# Patient Record
Sex: Female | Born: 1950 | Race: White | Hispanic: No | Smoking: Former smoker
Health system: Southern US, Community
[De-identification: ages and names within clinical notes are randomized; demographics above are authoritative.]

## PROBLEM LIST (undated history)

## (undated) DIAGNOSIS — K7460 Unspecified cirrhosis of liver: Secondary | ICD-10-CM

## (undated) DIAGNOSIS — Z9289 Personal history of other medical treatment: Secondary | ICD-10-CM

## (undated) DIAGNOSIS — D649 Anemia, unspecified: Secondary | ICD-10-CM

## (undated) DIAGNOSIS — B182 Chronic viral hepatitis C: Secondary | ICD-10-CM

## (undated) DIAGNOSIS — C801 Malignant (primary) neoplasm, unspecified: Secondary | ICD-10-CM

## (undated) DIAGNOSIS — J439 Emphysema, unspecified: Secondary | ICD-10-CM

## (undated) DIAGNOSIS — M1990 Unspecified osteoarthritis, unspecified site: Secondary | ICD-10-CM

## (undated) DIAGNOSIS — Z8719 Personal history of other diseases of the digestive system: Secondary | ICD-10-CM

## (undated) DIAGNOSIS — B1920 Unspecified viral hepatitis C without hepatic coma: Secondary | ICD-10-CM

## (undated) DIAGNOSIS — K219 Gastro-esophageal reflux disease without esophagitis: Secondary | ICD-10-CM

## (undated) DIAGNOSIS — H3530 Unspecified macular degeneration: Secondary | ICD-10-CM

## (undated) HISTORY — DX: Unspecified cirrhosis of liver: B18.2

## (undated) HISTORY — PX: EYE SURGERY: SHX253

## (undated) HISTORY — PX: CHOLECYSTECTOMY: SHX55

## (undated) HISTORY — DX: Unspecified cirrhosis of liver: K74.60

## (undated) HISTORY — PX: ABDOMINAL HYSTERECTOMY: SHX81

## (undated) HISTORY — DX: Unspecified viral hepatitis C without hepatic coma: B19.20

## (undated) HISTORY — DX: Personal history of other medical treatment: Z92.89

---

## 1997-09-12 HISTORY — PX: BREAST CYST EXCISION: SHX579

## 2005-09-09 ENCOUNTER — Ambulatory Visit: Payer: Self-pay | Admitting: General Practice

## 2013-07-23 ENCOUNTER — Ambulatory Visit: Payer: Self-pay | Admitting: Gastroenterology

## 2013-08-27 ENCOUNTER — Ambulatory Visit: Payer: Self-pay | Admitting: General Practice

## 2013-09-27 ENCOUNTER — Other Ambulatory Visit: Payer: Self-pay | Admitting: Gastroenterology

## 2013-09-27 LAB — CLOSTRIDIUM DIFFICILE(ARMC)

## 2014-03-28 ENCOUNTER — Ambulatory Visit: Payer: Self-pay | Admitting: Gastroenterology

## 2014-04-03 ENCOUNTER — Ambulatory Visit: Payer: Self-pay | Admitting: Internal Medicine

## 2014-04-03 LAB — CBC CANCER CENTER
BASOS ABS: 0 x10 3/mm (ref 0.0–0.1)
BASOS PCT: 0.5 %
Eosinophil #: 0.1 x10 3/mm (ref 0.0–0.7)
Eosinophil %: 2.4 %
HCT: 43.3 % (ref 35.0–47.0)
HGB: 14.6 g/dL (ref 12.0–16.0)
LYMPHS PCT: 13 %
Lymphocyte #: 0.5 x10 3/mm — ABNORMAL LOW (ref 1.0–3.6)
MCH: 34.9 pg — ABNORMAL HIGH (ref 26.0–34.0)
MCHC: 33.8 g/dL (ref 32.0–36.0)
MCV: 103 fL — AB (ref 80–100)
Monocyte #: 0.3 x10 3/mm (ref 0.2–0.9)
Monocyte %: 8.2 %
NEUTROS PCT: 75.9 %
Neutrophil #: 2.9 x10 3/mm (ref 1.4–6.5)
PLATELETS: 39 x10 3/mm — AB (ref 150–440)
RBC: 4.19 10*6/uL (ref 3.80–5.20)
RDW: 14.5 % (ref 11.5–14.5)
WBC: 3.8 x10 3/mm (ref 3.6–11.0)

## 2014-04-12 ENCOUNTER — Ambulatory Visit: Payer: Self-pay | Admitting: Internal Medicine

## 2014-04-17 LAB — PLATELET COUNT: Platelet: 39 10*3/uL — ABNORMAL LOW (ref 150–440)

## 2014-05-13 ENCOUNTER — Ambulatory Visit: Payer: Self-pay | Admitting: Internal Medicine

## 2014-09-17 ENCOUNTER — Ambulatory Visit: Payer: Self-pay | Admitting: Gastroenterology

## 2014-09-26 ENCOUNTER — Ambulatory Visit: Payer: Self-pay | Admitting: Gastroenterology

## 2015-03-31 ENCOUNTER — Other Ambulatory Visit: Payer: Self-pay | Admitting: Gastroenterology

## 2015-03-31 DIAGNOSIS — K7460 Unspecified cirrhosis of liver: Secondary | ICD-10-CM

## 2015-04-02 ENCOUNTER — Ambulatory Visit
Admission: RE | Admit: 2015-04-02 | Discharge: 2015-04-02 | Disposition: A | Discharge status: Home / Self Care | Payer: Medicare Other | Source: Ambulatory Visit | Attending: Gastroenterology | Admitting: Gastroenterology

## 2015-04-02 DIAGNOSIS — K7031 Alcoholic cirrhosis of liver with ascites: Secondary | ICD-10-CM | POA: Insufficient documentation

## 2015-04-02 DIAGNOSIS — I868 Varicose veins of other specified sites: Secondary | ICD-10-CM | POA: Diagnosis not present

## 2015-04-02 DIAGNOSIS — K7460 Unspecified cirrhosis of liver: Secondary | ICD-10-CM

## 2015-04-02 DIAGNOSIS — R161 Splenomegaly, not elsewhere classified: Secondary | ICD-10-CM | POA: Insufficient documentation

## 2015-06-18 ENCOUNTER — Emergency Department: Payer: Medicare Other

## 2015-06-18 ENCOUNTER — Emergency Department
Admission: EM | Admit: 2015-06-18 | Discharge: 2015-06-18 | Disposition: A | Discharge status: Home / Self Care | Payer: Medicare Other | Attending: Emergency Medicine | Admitting: Emergency Medicine

## 2015-06-18 DIAGNOSIS — S20212A Contusion of left front wall of thorax, initial encounter: Secondary | ICD-10-CM | POA: Diagnosis not present

## 2015-06-18 DIAGNOSIS — S29001A Unspecified injury of muscle and tendon of front wall of thorax, initial encounter: Secondary | ICD-10-CM | POA: Diagnosis present

## 2015-06-18 DIAGNOSIS — Y9389 Activity, other specified: Secondary | ICD-10-CM | POA: Insufficient documentation

## 2015-06-18 DIAGNOSIS — Y990 Civilian activity done for income or pay: Secondary | ICD-10-CM | POA: Insufficient documentation

## 2015-06-18 DIAGNOSIS — Y9289 Other specified places as the place of occurrence of the external cause: Secondary | ICD-10-CM | POA: Diagnosis not present

## 2015-06-18 DIAGNOSIS — X58XXXA Exposure to other specified factors, initial encounter: Secondary | ICD-10-CM | POA: Diagnosis not present

## 2015-06-18 DIAGNOSIS — Z880 Allergy status to penicillin: Secondary | ICD-10-CM | POA: Diagnosis not present

## 2015-06-18 MED ORDER — PROMETHAZINE HCL 25 MG PO TABS: 25.0000 mg | ORAL_TABLET | Freq: Four times a day (QID) | ORAL | Status: DC | PRN

## 2015-06-18 MED ORDER — BACLOFEN 10 MG PO TABS: 10.0000 mg | ORAL_TABLET | Freq: Three times a day (TID) | ORAL | Status: DC

## 2015-06-18 MED ORDER — OXYCODONE HCL 5 MG PO TABS: 5.0000 mg | ORAL_TABLET | ORAL | Status: DC | PRN

## 2015-06-18 MED ORDER — IBUPROFEN 800 MG PO TABS: 800.0000 mg | ORAL_TABLET | Freq: Three times a day (TID) | ORAL | Status: DC | PRN

## 2015-06-18 NOTE — ED Provider Notes (Signed)
[facility] Emergency Department Provider Note  ____________________________________________  Time seen: Approximately 8:48 AM  I have reviewed the triage vital signs and the nursing notes.   HISTORY  Chief Complaint Muscle Pain    HPI Kieana Livesay is a 64 y.o. female who reports that she was working out with her trainer when she felt something pop in her left lateral chest approximately one week ago. Complains of continuous pain in the left rib and muscle area.   No past medical history on file.  There are no active problems to display for this patient.   No past surgical history on file.  Current Outpatient Rx  Name  Route  Sig  Dispense  Refill  . baclofen (LIORESAL) 10 MG tablet   Oral   Take 1 tablet (10 mg total) by mouth 3 (three) times daily.   30 each   0   . ibuprofen (ADVIL,MOTRIN) 800 MG tablet   Oral   Take 1 tablet (800 mg total) by mouth every 8 (eight) hours as needed.   30 tablet   0   . oxyCODONE (OXY IR/ROXICODONE) 5 MG immediate release tablet   Oral   Take 1 tablet (5 mg total) by mouth every 4 (four) hours as needed for severe pain.   8 tablet   0     Allergies Penicillins  No family history on file.  Social History Social History  Substance Use Topics  . Smoking status: Not on file  . Smokeless tobacco: Not on file  . Alcohol Use: Not on file    Review of Systems Constitutional: No fever/chills Eyes: No visual changes. ENT: No sore throat. Cardiovascular: Denies chest pain. Respiratory: Denies shortness of breath. Gastrointestinal: No abdominal pain.  No nausea, no vomiting.  No diarrhea.  No constipation. Genitourinary: Negative for dysuria. Musculoskeletal: Negative for back pain. Positive for left rib and muscle pain. Skin: Negative for rash. Neurological: Negative for headaches, focal weakness or numbness.  10-point ROS otherwise  negative.  ____________________________________________   PHYSICAL EXAM:  VITAL SIGNS: ED Triage Vitals  Enc Vitals Group     BP 06/18/15 0838 128/67 mmHg     Pulse Rate 06/18/15 0838 72     Resp 06/18/15 0838 18     Temp 06/18/15 0838 97.7 F (36.5 C)     Temp Source 06/18/15 0838 Oral     SpO2 06/18/15 0838 98 %     Weight 06/18/15 0838 170 lb (77.111 kg)     Height 06/18/15 0838 5\' 2"  (1.575 m)     Head Cir --      Peak Flow --      Pain Score 06/18/15 0839 8     Pain Loc --      Pain Edu? --      Excl. in [place]? --     Constitutional: Alert and oriented. Well appearing and in no acute distress. Neck: No stridor.   Cardiovascular: Normal rate, regular rhythm. Grossly normal heart sounds.  Good peripheral circulation. Respiratory: Normal respiratory effort.  No retractions. Lungs CTAB. Gastrointestinal: Soft and nontender. No distention. No abdominal bruits. No CVA tenderness. Musculoskeletal: No lower extremity tenderness nor edema.  No joint effusions. Definite left lower rib tenderness and pain. No ecchymosis or bruising noted. Neurologic:  Normal speech and language. No gross focal neurologic deficits are appreciated. No gait instability. Skin:  Skin is warm, dry and intact. No rash noted. Psychiatric: Mood and affect are normal. Speech and  behavior are normal.  ____________________________________________   LABS (all labs ordered are listed, but only abnormal results are displayed)  Labs Reviewed - No data to display ____________________________________________    RADIOLOGY  No acute pulmonary findings no acute rib fractures noted. Interpreted by radiology and reviewed by myself. ____________________________________________   PROCEDURES  Procedure(s) performed: None  Critical Care performed: No  ____________________________________________   INITIAL IMPRESSION / ASSESSMENT AND PLAN / ED COURSE  Pertinent labs & imaging results that were available  during my care of the patient were reviewed by me and considered in my medical decision making (see chart for details).  Acute left chest muscle strain and rib contusion. Rx given for Motrin 800 mg 3 times a day, Flexeril 5 mg 3 times a day and oxycodone 5 one to 2 when necessary severe pain #8 no refills. She'll follow up with PCP or return to the ER if any worsening symptomology.  She voices no other emergency medical complaints at this visit. ____________________________________________   FINAL CLINICAL IMPRESSION(S) / ED DIAGNOSES  Final diagnoses:  Rib contusion, left, initial encounter      Arlyss Repress, PA-C 06/18/15 [place], MD 06/18/15 1521

## 2015-06-18 NOTE — Discharge Instructions (Signed)
Rib Contusion A rib contusion is a deep bruise on your rib area. Contusions are the result of a blunt trauma that causes bleeding and injury to the tissues under the skin. A rib contusion may involve bruising of the ribs and of the skin and muscles in the area. The skin overlying the contusion may turn blue, purple, or yellow. Minor injuries will give you a painless contusion, but more severe contusions may stay painful and swollen for a few weeks. CAUSES  A contusion is usually caused by a blow, trauma, or direct force to an area of the body. This often occurs while playing contact sports. SYMPTOMS  Swelling and redness of the injured area.  Discoloration of the injured area.  Tenderness and soreness of the injured area.  Pain with or without movement. DIAGNOSIS  The diagnosis can be made by taking a medical history and performing a physical exam. An X-ray, CT scan, or MRI may be needed to determine if there were any associated injuries, such as broken bones (fractures) or internal injuries. TREATMENT  Often, the best treatment for a rib contusion is rest. Icing or applying cold compresses to the injured area may help reduce swelling and inflammation. Deep breathing exercises may be recommended to reduce the risk of partial lung collapse and pneumonia. Over-the-counter or prescription medicines may also be recommended for pain control. HOME CARE INSTRUCTIONS   Apply ice to the injured area:  Put ice in a plastic bag.  Place a towel between your skin and the bag.  Leave the ice on for 20 minutes, 2-3 times per day.  Take medicines only as directed by your health care provider.  Rest the injured area. Avoid strenuous activity and any activities or movements that cause pain. Be careful during activities and avoid bumping the injured area.  Perform deep-breathing exercises as directed by your health care provider.  Do not lift anything that is heavier than 5 lb (2.3 kg) until your  health care provider approves.  Do not use any tobacco products, including cigarettes, chewing tobacco, or electronic cigarettes. If you need help quitting, ask your health care provider. SEEK MEDICAL CARE IF:   You have increased bruising or swelling.  You have pain that is not controlled with treatment.  You have a fever. SEEK IMMEDIATE MEDICAL CARE IF:   You have difficulty breathing or shortness of breath.  You develop a continual cough, or you cough up thick or bloody sputum.  You feel sick to your stomach (nauseous), you throw up (vomit), or you have abdominal pain.   This information is not intended to replace advice given to you by your health care provider. Make sure you discuss any questions you have with your health care provider.   Document Released: 05/24/2001 Document Revised: 09/19/2014 Document Reviewed: 06/10/2014 Elsevier Interactive Patient Education 2016 [employer]  

## 2015-06-18 NOTE — ED Notes (Signed)
Pt reports she was working with a trainer 1 week ago and felt a "pop" to the left side of her rib cage.

## 2015-08-04 ENCOUNTER — Inpatient Hospital Stay: Payer: Medicare Other | Attending: Internal Medicine | Admitting: Internal Medicine

## 2015-08-04 ENCOUNTER — Encounter: Payer: Self-pay | Admitting: Internal Medicine

## 2015-08-04 VITALS — BP 109/74 | HR 65 | Temp 97.5°F | Resp 16 | Wt 174.2 lb

## 2015-08-04 DIAGNOSIS — Z801 Family history of malignant neoplasm of trachea, bronchus and lung: Secondary | ICD-10-CM | POA: Insufficient documentation

## 2015-08-04 DIAGNOSIS — Z8619 Personal history of other infectious and parasitic diseases: Secondary | ICD-10-CM | POA: Insufficient documentation

## 2015-08-04 DIAGNOSIS — K766 Portal hypertension: Secondary | ICD-10-CM | POA: Diagnosis not present

## 2015-08-04 DIAGNOSIS — K7460 Unspecified cirrhosis of liver: Secondary | ICD-10-CM | POA: Diagnosis not present

## 2015-08-04 DIAGNOSIS — R161 Splenomegaly, not elsewhere classified: Secondary | ICD-10-CM | POA: Insufficient documentation

## 2015-08-04 DIAGNOSIS — Z803 Family history of malignant neoplasm of breast: Secondary | ICD-10-CM | POA: Insufficient documentation

## 2015-08-04 DIAGNOSIS — D696 Thrombocytopenia, unspecified: Secondary | ICD-10-CM | POA: Diagnosis not present

## 2015-08-04 DIAGNOSIS — Z79899 Other long term (current) drug therapy: Secondary | ICD-10-CM | POA: Diagnosis not present

## 2015-08-04 DIAGNOSIS — D731 Hypersplenism: Secondary | ICD-10-CM | POA: Diagnosis not present

## 2015-08-04 DIAGNOSIS — D72819 Decreased white blood cell count, unspecified: Secondary | ICD-10-CM | POA: Diagnosis not present

## 2015-08-04 DIAGNOSIS — D6959 Other secondary thrombocytopenia: Secondary | ICD-10-CM

## 2015-08-04 NOTE — Progress Notes (Signed)
[place] @ [facility] [phone]  [phone]     Mylin Compo OB: 1951/01/01  MR#: [id number]  [id number]  Patient Care Team: Glendon Axe, MD as PCP - General (Internal Medicine)  CHIEF COMPLAINT:  Chief Complaint  Patient presents with  . New Evaluation    thrombocytopenia     No history exists.  Mrs. Beus was referred to our clinic originally in 2015 for evaluation of thrombocytopenia. Extensive workup revealed presence of hepatitis C, liver cirrhosis with portal hypertension, splenomegaly, hypersplenism, which was felt to be the reason for hematologic abnormalities. She was treated with Harvoni , so hepatitis C is essentially cured.  INTERVAL HISTORY:  Since her last visit in 2015 Ms. Covington has done reasonably well. She has a decent energy level, she denies abdominal distention increased abdominal girth, lower extremity edema, excessive bleeding. She endorses easy bruising though. She had FOBT positive recently, and is scheduled for upper and lower endoscopy to monitor the status of varicose veins. She also denies shortness of breath, confusion, chest pain, nausea, vomiting, diarrhea constipation.   REVIEW OF SYSTEMS:   As per HPI. Otherwise, a complete review of systems is negatve.  PAST MEDICAL HISTORY: Past Medical History  Diagnosis Date  . Hepatitis C   . Hx of transfusion of whole blood   . Cirrhosis ([place])   . Chronic hepatitis C with cirrhosis ([place])     PAST SURGICAL HISTORY: Past Surgical History  Procedure Laterality Date  . Cholecystectomy    . Abdominal hysterectomy      FAMILY HISTORY Family History  Problem Relation Age of Onset  . Lung cancer Mother   . Breast cancer Mother   . Breast cancer Sister     ADVANCED DIRECTIVES:  No flowsheet data found.  HEALTH MAINTENANCE: Social History  Substance Use Topics  . Smoking status: Never Smoker   . Smokeless tobacco: None  . Alcohol Use: Yes     Comment:  occasional wine and beer  drinks beer and wine occasionally.    Allergies  Allergen Reactions  . Penicillins Rash    Current Outpatient Prescriptions  Medication Sig Dispense Refill  . Ascorbic Acid (VITAMIN C) 1000 MG tablet Take by mouth.    . Cinnamon 500 MG capsule Take by mouth.    . Folic Acid 20 MG CAPS Take by mouth.    . furosemide (LASIX) 40 MG tablet Take by mouth.    . nadolol (CORGARD) 20 MG tablet Take by mouth.    . pantoprazole (PROTONIX) 40 MG tablet Take by mouth.    . spironolactone (ALDACTONE) 100 MG tablet Take by mouth.     No current facility-administered medications for this visit.    OBJECTIVE:  Filed Vitals:   08/04/15 1523  BP: 109/74  Pulse: 65  Temp: 97.5 F (36.4 C)  Resp: 16     Body mass index is 31.85 kg/(m^2).    ECOG FS:0 - Asymptomatic  PHYSICAL EXAM: BP 109/74 mmHg  Pulse 65  Temp(Src) 97.5 F (36.4 C) (Tympanic)  Resp 16  Wt 174 lb 3.3 oz (79.02 kg)  General Appearance:    Alert, cooperative, no distress, appears stated age 64 female   Head:    Normocephalic, without obvious abnormality, atraumatic  Eyes:    PERRL, conjunctiva/corneas clear, EOM's intact, fundi    benign, both eyes  Ears:    Normal TM's and external ear canals, both ears  Nose:   Nares normal, septum midline, mucosa normal, no  drainage    or sinus tenderness  Throat:   Lips, mucosa, and tongue normal; teeth and gums normal  Neck:   Supple, symmetrical, trachea midline, no adenopathy;    thyroid:  no enlargement/tenderness/nodules; no carotid   bruit or JVD  Back:     Symmetric, no curvature, ROM normal, no CVA tenderness  Lungs:     Clear to auscultation bilaterally, respirations unlabored  Chest Wall:    No tenderness or deformity   Heart:    Regular rate and rhythm, S1 and S2 normal, no murmur, rub   or gallop  Breast Exam:    No tenderness, masses, or nipple abnormality  Abdomen:     Soft, non-tender, bowel sounds active all four quadrants,     no masses, no organomegaly is appreciated, but physical exam is difficult secondary to body habitus  Genitalia:    Normal female without lesion, discharge or tenderness  Rectal:    Normal tone, normal prostate, no masses or tenderness;   guaiac negative stool  Extremities:   Extremities normal, atraumatic, no cyanosis or edema  Pulses:   2+ and symmetric all extremities  Skin:   Skin color, texture, turgor normal, no rashes or lesions  Lymph nodes:   Cervical, supraclavicular, and axillary nodes normal  Neurologic:   CNII-XII intact, normal strength, sensation and reflexes    throughout      LAB RESULTS: CBC from 07/21/2015 White blood cell count 3.3, hemoglobin 13.1, platelet count 40,000, absolute neutrophil count 2300, absolute lymphocyte count 500, absolute monocyte count 400   PT performed on 05/26/2015-15.7 seconds, INR 1.3 ULTRASOUND ABDOMEN COMPLETE  COMPARISON: Abdominal ultrasound of September 17, 2014  FINDINGS: Gallbladder: The gallbladder is surgically absent.  Common bile duct: Diameter: 3.5 mm.  Liver: The hepatic echotexture is heterogeneous. The surface contour is irregular. There is no intrahepatic ductal dilation. There varices noted at the level of the porta hepatis.  IVC: No abnormality visualized.  Pancreas: Bowel gas obscured the pancreatic head and tail.  Spleen: The spleen is enlarged and exhibits a volume of 647 cc.  Right Kidney: Length: 10.7 cm. Echogenicity within normal limits. No mass or hydronephrosis visualized.  Left Kidney: Length: 10.9 cm. Echogenicity within normal limits. No mass or hydronephrosis visualized.  Abdominal aorta: No aneurysm visualized.  Other findings: No ascites is observed.  IMPRESSION: 1. 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 There is no hepatic mass. The gallbladder is surgically absent. There is limited evaluation of the pancreatic head and tail due to bowel gas. 2. The  kidneys and abdominal aorta are unremarkable.    STUDIES: No results found.  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 Clinically she continues to do fairly well. Hematologic parameters appear stable and within the range expected for hypersplenism. Unfortunately, no intervention short of liver transplant is going to improve her hematologic parameters. If patient requires invasive procedures such as biopsies during upper and lower endoscopies, then platelet transfusions should be given as needed to prevent bleeding. Same applies to potential need for clotting factors, which could be decreased secondary to liver cirrhosis-if needed, FFP should be administered around the time of procedure.  No need for further evaluation or follow-up with hematology.  Patient expressed understanding and was in agreement with this plan. She also understands that She can call clinic at any time with any questions, concerns,  or complaints.    No matching staging information was found for the patient.  Roxana Hires, MD   08/04/2015 4:45 PM

## 2015-08-04 NOTE — Progress Notes (Signed)
Patient has thrombocytopenia that is followed by PCP and is scheduled for colonoscopy with EGD scheduled for 09/17/2014.  GI would like her to be re-evaluated before procedures.

## 2015-09-17 ENCOUNTER — Encounter: Payer: Self-pay | Admitting: *Deleted

## 2015-09-18 ENCOUNTER — Ambulatory Visit: Payer: Medicare Other | Admitting: Anesthesiology

## 2015-09-18 ENCOUNTER — Encounter: Payer: Self-pay | Admitting: Anesthesiology

## 2015-09-18 ENCOUNTER — Encounter
Admission: RE | Disposition: A | Discharge status: Home / Self Care | Payer: Self-pay | Source: Ambulatory Visit | Attending: Gastroenterology

## 2015-09-18 ENCOUNTER — Ambulatory Visit
Admission: RE | Admit: 2015-09-18 | Discharge: 2015-09-18 | Disposition: A | Discharge status: Home / Self Care | Payer: Medicare Other | Source: Ambulatory Visit | Attending: Gastroenterology | Admitting: Gastroenterology

## 2015-09-18 DIAGNOSIS — Z539 Procedure and treatment not carried out, unspecified reason: Secondary | ICD-10-CM | POA: Insufficient documentation

## 2015-09-18 DIAGNOSIS — I8510 Secondary esophageal varices without bleeding: Secondary | ICD-10-CM | POA: Diagnosis not present

## 2015-09-18 DIAGNOSIS — Z87891 Personal history of nicotine dependence: Secondary | ICD-10-CM | POA: Diagnosis not present

## 2015-09-18 DIAGNOSIS — K7460 Unspecified cirrhosis of liver: Secondary | ICD-10-CM | POA: Diagnosis not present

## 2015-09-18 DIAGNOSIS — B182 Chronic viral hepatitis C: Secondary | ICD-10-CM | POA: Diagnosis not present

## 2015-09-18 LAB — CBC
HCT: 42.3 % (ref 35.0–47.0)
HEMOGLOBIN: 14.2 g/dL (ref 12.0–16.0)
MCH: 32.7 pg (ref 26.0–34.0)
MCHC: 33.6 g/dL (ref 32.0–36.0)
MCV: 97.3 fL (ref 80.0–100.0)
Platelets: 40 10*3/uL — ABNORMAL LOW (ref 150–440)
RBC: 4.35 MIL/uL (ref 3.80–5.20)
RDW: 13.9 % (ref 11.5–14.5)
WBC: 2.9 10*3/uL — ABNORMAL LOW (ref 3.6–11.0)

## 2015-09-18 LAB — BASIC METABOLIC PANEL
ANION GAP: 6 (ref 5–15)
BUN: 11 mg/dL (ref 6–20)
CALCIUM: 9.2 mg/dL (ref 8.9–10.3)
CO2: 27 mmol/L (ref 22–32)
Chloride: 104 mmol/L (ref 101–111)
Creatinine, Ser: 0.6 mg/dL (ref 0.44–1.00)
GFR calc Af Amer: >60 mL/min (ref >60)
GLUCOSE: 100 mg/dL — AB (ref 65–99)
Potassium: 4.1 mmol/L (ref 3.5–5.1)
Sodium: 137 mmol/L (ref 135–145)

## 2015-09-18 LAB — HEPATIC FUNCTION PANEL
ALBUMIN: 3.4 g/dL — AB (ref 3.5–5.0)
ALK PHOS: 76 U/L (ref 38–126)
ALT: 18 U/L (ref 14–54)
AST: 37 U/L (ref 15–41)
BILIRUBIN INDIRECT: 2.4 mg/dL — AB (ref 0.3–0.9)
BILIRUBIN TOTAL: 3 mg/dL — AB (ref 0.3–1.2)
Bilirubin, Direct: 0.6 mg/dL — ABNORMAL HIGH (ref 0.1–0.5)
TOTAL PROTEIN: 7.7 g/dL (ref 6.5–8.1)

## 2015-09-18 LAB — PROTIME-INR
INR: 1.47
PROTHROMBIN TIME: 17.9 s — AB (ref 11.4–15.0)

## 2015-09-18 LAB — AMMONIA: AMMONIA: 50 umol/L — AB (ref 9–35)

## 2015-09-18 SURGERY — COLONOSCOPY WITH PROPOFOL
Anesthesia: General

## 2015-09-18 MED ORDER — SODIUM CHLORIDE 0.9 % IV SOLN: INTRAVENOUS | Status: DC

## 2015-09-18 NOTE — Anesthesia Preprocedure Evaluation (Addendum)
Anesthesia Evaluation  Patient identified by MRN, date of birth, ID band Patient awake    Reviewed: Allergy & Precautions, H&P , NPO status , Patient's Chart, lab work & pertinent test results, reviewed documented beta blocker date and time   History of Anesthesia Complications Negative for: history of anesthetic complications  Airway Mallampati: II  TM Distance: >3 FB Neck ROM: full    Dental no notable dental hx. (+) Caps   Pulmonary neg pulmonary ROS, former smoker,    Pulmonary exam normal breath sounds clear to auscultation       Cardiovascular Exercise Tolerance: Good negative cardio ROS Normal cardiovascular exam Rhythm:regular Rate:Normal     Neuro/Psych negative neurological ROS  negative psych ROS   GI/Hepatic neg GERD  ,(+) Cirrhosis   Esophageal Varices    , Hepatitis - (s/p treatment), CEsophageal varices   Endo/Other  negative endocrine ROS  Renal/GU negative Renal ROS  negative genitourinary   Musculoskeletal   Abdominal   Peds  Hematology negative hematology ROS (+)   Anesthesia Other Findings Past Medical History:   Hepatitis C                                                  Hx of transfusion of whole blood                             Cirrhosis (HCC)                                              Chronic hepatitis C with cirrhosis (HCC)                     Reproductive/Obstetrics negative OB ROS                            Anesthesia Physical Anesthesia Plan  ASA: IV  Anesthesia Plan: General   Post-op Pain Management:    Induction:   Airway Management Planned:   Additional Equipment:   Intra-op Plan:   Post-operative Plan:   Informed Consent: I have reviewed the patients History and Physical, chart, labs and discussed the procedure including the risks, benefits and alternatives for the proposed anesthesia with the patient or authorized representative  who has indicated his/her understanding and acceptance.   Dental Advisory Given  Plan Discussed with: Anesthesiologist, CRNA and Surgeon  Anesthesia Plan Comments:         Anesthesia Quick Evaluation

## 2015-09-18 NOTE — Evaluation (Signed)
Patient presenting today for EGD and colonoscopy in regards to a positive cologard test and personal history of colon polyps in the setting of cirrhosis. Her to the procedure CBC with platelet count and pro time were done for these being abnormal the platelet count being only 40. Such we'll need to delay active procedure until he can arrange in with platelets given for the procedure. Also of note has a elevated pro time in regards to her history of liver disease.  This is been explained fully to the patient and we will help her rearrange.

## 2015-11-27 ENCOUNTER — Other Ambulatory Visit
Admission: RE | Admit: 2015-11-27 | Discharge: 2015-11-27 | Disposition: A | Discharge status: Home / Self Care | Payer: Medicare Other | Source: Ambulatory Visit | Attending: Gastroenterology | Admitting: Gastroenterology

## 2015-11-27 DIAGNOSIS — K7460 Unspecified cirrhosis of liver: Secondary | ICD-10-CM | POA: Insufficient documentation

## 2015-11-27 DIAGNOSIS — D696 Thrombocytopenia, unspecified: Secondary | ICD-10-CM | POA: Insufficient documentation

## 2015-11-27 DIAGNOSIS — D649 Anemia, unspecified: Secondary | ICD-10-CM | POA: Diagnosis not present

## 2015-11-27 LAB — CBC
HCT: 37.8 % (ref 35.0–47.0)
Hemoglobin: 12.8 g/dL (ref 12.0–16.0)
MCH: 33.6 pg (ref 26.0–34.0)
MCHC: 34 g/dL (ref 32.0–36.0)
MCV: 99.1 fL (ref 80.0–100.0)
Platelets: 33 10*3/uL — ABNORMAL LOW (ref 150–440)
RBC: 3.81 MIL/uL (ref 3.80–5.20)
RDW: 14.7 % — ABNORMAL HIGH (ref 11.5–14.5)
WBC: 1.8 10*3/uL — ABNORMAL LOW (ref 3.6–11.0)

## 2015-11-27 LAB — PROTIME-INR
INR: 1.55
Prothrombin Time: 18.6 seconds — ABNORMAL HIGH (ref 11.4–15.0)

## 2015-11-27 LAB — TYPE AND SCREEN
ABO/RH(D): A POS
Antibody Screen: NEGATIVE

## 2015-11-27 LAB — ABO/RH: ABO/RH(D): A POS

## 2015-11-30 ENCOUNTER — Encounter
Admission: RE | Disposition: A | Discharge status: Home / Self Care | Payer: Self-pay | Source: Ambulatory Visit | Attending: Gastroenterology

## 2015-11-30 ENCOUNTER — Ambulatory Visit: Payer: Medicare Other | Admitting: Anesthesiology

## 2015-11-30 ENCOUNTER — Ambulatory Visit
Admission: RE | Admit: 2015-11-30 | Discharge: 2015-11-30 | Disposition: A | Discharge status: Home / Self Care | Payer: Medicare Other | Source: Ambulatory Visit | Attending: Gastroenterology | Admitting: Gastroenterology

## 2015-11-30 ENCOUNTER — Encounter: Payer: Self-pay | Admitting: *Deleted

## 2015-11-30 DIAGNOSIS — D689 Coagulation defect, unspecified: Secondary | ICD-10-CM | POA: Diagnosis not present

## 2015-11-30 DIAGNOSIS — D696 Thrombocytopenia, unspecified: Secondary | ICD-10-CM | POA: Diagnosis not present

## 2015-11-30 DIAGNOSIS — B182 Chronic viral hepatitis C: Secondary | ICD-10-CM | POA: Diagnosis not present

## 2015-11-30 DIAGNOSIS — K7460 Unspecified cirrhosis of liver: Secondary | ICD-10-CM | POA: Diagnosis not present

## 2015-11-30 DIAGNOSIS — Z79899 Other long term (current) drug therapy: Secondary | ICD-10-CM | POA: Diagnosis not present

## 2015-11-30 DIAGNOSIS — Z880 Allergy status to penicillin: Secondary | ICD-10-CM | POA: Diagnosis not present

## 2015-11-30 LAB — PLATELET COUNT: Platelets: 42 10*3/uL — ABNORMAL LOW (ref 150–440)

## 2015-11-30 LAB — PROTIME-INR
INR: 1.66
Prothrombin Time: 19.6 seconds — ABNORMAL HIGH (ref 11.4–15.0)

## 2015-11-30 LAB — AMMONIA: Ammonia: 27 umol/L (ref 9–35)

## 2015-11-30 SURGERY — COLONOSCOPY WITH PROPOFOL
Anesthesia: General

## 2015-11-30 MED ORDER — SODIUM CHLORIDE 0.9 % IV SOLN: INTRAVENOUS | Status: DC

## 2015-11-30 MED ORDER — SODIUM CHLORIDE 0.9 % IV SOLN
INTRAVENOUS | Status: DC
  Administered 2015-11-30: 10:00:00 via INTRAVENOUS

## 2015-11-30 MED ORDER — SODIUM CHLORIDE 0.9 % IV SOLN
INTRAVENOUS | Status: DC
  Administered 2015-11-30: 09:00:00 via INTRAVENOUS

## 2015-11-30 NOTE — Anesthesia Preprocedure Evaluation (Deleted)
Anesthesia Evaluation  Patient identified by MRN, date of birth, ID band Patient awake    Reviewed: Allergy & Precautions, NPO status , Patient's Chart, lab work & pertinent test results, reviewed documented beta blocker date and time   Airway Mallampati: II  TM Distance: >3 FB     Dental  (+) Chipped   Pulmonary           Cardiovascular      Neuro/Psych    GI/Hepatic (+) Hepatitis -, C  Endo/Other    Renal/GU      Musculoskeletal   Abdominal   Peds  Hematology   Anesthesia Other Findings   Reproductive/Obstetrics                             Anesthesia Physical Anesthesia Plan  ASA: III  Anesthesia Plan: General   Post-op Pain Management:    Induction: Intravenous  Airway Management Planned: Nasal Cannula  Additional Equipment:   Intra-op Plan:   Post-operative Plan:   Informed Consent: I have reviewed the patients History and Physical, chart, labs and discussed the procedure including the risks, benefits and alternatives for the proposed anesthesia with the patient or authorized representative who has indicated his/her understanding and acceptance.     Plan Discussed with: CRNA  Anesthesia Plan Comments:         Anesthesia Quick Evaluation

## 2015-11-30 NOTE — H&P (Signed)
Outpatient short stay form Pre-procedure 11/30/2015 10:14 AM Michelle Sails MD  Primary Physician: Dr. Glendon Axe  Reason for visit:  EGD and colonoscopy  History of present illness:  Patient is a 65 year old female presenting today for EGD and colonoscopy. She has a history of cirrhosis with marked thrombocytopenia as well as coagulopathy. She has been successfully treated for hepatitis C. He is presenting today for EGD evaluation in regards possible varices/cirrhosis as well as a personal history of colon polyps. Her last colonoscopy was in 2011 with a finding of one tubular adenoma.  She tolerated her prep well. She takes no aspirin or blood thinning products. Due to the coagulopathy she is receiving a unit of pheresis platelets. Platelets and pro time will need to be checked after the infusion is done.    Current facility-administered medications:  .  0.9 %  sodium chloride infusion, , Intravenous, Continuous, Michelle Sails, MD, Last Rate: 20 mL/hr at 11/30/15 0906 .  0.9 %  sodium chloride infusion, , Intravenous, Continuous, Michelle Sails, MD, Last Rate: 20 mL/hr at 11/30/15 0941 .  0.9 %  sodium chloride infusion, , Intravenous, Continuous, Michelle Sails, MD  Prescriptions prior to admission  Medication Sig Dispense Refill Last Dose  . Ascorbic Acid (VITAMIN C) 1000 MG tablet Take by mouth.   Past Week at Unknown time  . Cinnamon 500 MG capsule Take by mouth.   Past Week at Unknown time  . Folic Acid 20 MG CAPS Take by mouth.   Past Week at Unknown time  . furosemide (LASIX) 40 MG tablet Take by mouth.   11/29/2015 at Unknown time  . nadolol (CORGARD) 20 MG tablet Take by mouth.   11/29/2015 at Unknown time  . pantoprazole (PROTONIX) 40 MG tablet Take by mouth.   11/29/2015 at Unknown time  . spironolactone (ALDACTONE) 100 MG tablet Take by mouth.   11/29/2015 at Unknown time  . baclofen (LIORESAL) 10 MG tablet Take 10 mg by mouth 3 (three) times daily.     .  promethazine (PHENERGAN) 25 MG tablet Take 25 mg by mouth every 6 (six) hours as needed for nausea or vomiting.     . traMADol-acetaminophen (ULTRACET) 37.5-325 MG tablet Take 1 tablet by mouth every 8 (eight) hours as needed.        Allergies  Allergen Reactions  . Penicillins Rash     Past Medical History  Diagnosis Date  . Hepatitis C   . Hx of transfusion of whole blood   . Cirrhosis ([place])   . Chronic hepatitis C with cirrhosis ([place])     Review of systems:      Physical Exam    Heart and lungs: Regular rate and rhythm without rub or gallop, lungs are bilaterally clear.    HEENT: Normocephalic atraumatic eyes are anicteric. It is of note she has a some conjunctival hemorrhage on the right that is resolving    Other:     Pertinant exam for procedure: Soft nontender nondistended bowel sounds positive normoactive.    Planned proceedures: EGD, colonoscopy and indicated procedures. I have discussed the risks benefits and complications of procedures to include not limited to bleeding, infection, perforation and the risk of sedation and the patient wishes to proceed.    Michelle Sails, MD Gastroenterology 11/30/2015  10:14 AM

## 2015-12-01 LAB — PREPARE PLATELET PHERESIS: Unit division: 0

## 2015-12-15 ENCOUNTER — Other Ambulatory Visit: Payer: Self-pay | Admitting: Gastroenterology

## 2015-12-15 DIAGNOSIS — Z86010 Personal history of colonic polyps: Secondary | ICD-10-CM

## 2015-12-15 DIAGNOSIS — K7460 Unspecified cirrhosis of liver: Secondary | ICD-10-CM

## 2015-12-16 ENCOUNTER — Other Ambulatory Visit: Payer: Self-pay | Admitting: Gastroenterology

## 2015-12-16 DIAGNOSIS — K7460 Unspecified cirrhosis of liver: Secondary | ICD-10-CM

## 2015-12-16 DIAGNOSIS — R1031 Right lower quadrant pain: Secondary | ICD-10-CM

## 2015-12-22 ENCOUNTER — Ambulatory Visit: Payer: Medicare Other

## 2015-12-29 ENCOUNTER — Ambulatory Visit: Payer: Medicare Other

## 2016-01-07 ENCOUNTER — Ambulatory Visit: Payer: Medicare Other

## 2016-01-08 ENCOUNTER — Ambulatory Visit
Admission: RE | Admit: 2016-01-08 | Discharge: 2016-01-08 | Disposition: A | Discharge status: Home / Self Care | Payer: Medicare Other | Source: Ambulatory Visit | Attending: Gastroenterology | Admitting: Gastroenterology

## 2016-01-08 DIAGNOSIS — K7460 Unspecified cirrhosis of liver: Secondary | ICD-10-CM | POA: Insufficient documentation

## 2016-01-08 DIAGNOSIS — K5730 Diverticulosis of large intestine without perforation or abscess without bleeding: Secondary | ICD-10-CM | POA: Insufficient documentation

## 2016-01-08 DIAGNOSIS — R1031 Right lower quadrant pain: Secondary | ICD-10-CM

## 2016-01-08 MED ORDER — GADOXETATE DISODIUM 0.25 MMOL/ML IV SOLN
8.0000 mL | Freq: Once | INTRAVENOUS | Status: AC | PRN
  Administered 2016-01-08: 10 mL via INTRAVENOUS

## 2016-11-08 ENCOUNTER — Other Ambulatory Visit: Payer: Self-pay | Admitting: Internal Medicine

## 2016-11-08 ENCOUNTER — Telehealth: Payer: Self-pay | Admitting: *Deleted

## 2016-11-08 DIAGNOSIS — Z1231 Encounter for screening mammogram for malignant neoplasm of breast: Secondary | ICD-10-CM

## 2016-11-08 DIAGNOSIS — Z87891 Personal history of nicotine dependence: Secondary | ICD-10-CM

## 2016-11-08 NOTE — Telephone Encounter (Signed)
Received referral for initial lung cancer screening scan. Contacted patient and obtained smoking history,(current, 45 pack year) as well as answering questions related to screening process. Patient denies signs of lung cancer such as weight loss or hemoptysis. Patient denies comorbidity that would prevent curative treatment if lung cancer were found. Patient is tentatively scheduled for shared decision making visit and CT scan on 11/15/16, pending insurance approval from business office.

## 2016-11-11 ENCOUNTER — Ambulatory Visit
Admission: RE | Admit: 2016-11-11 | Discharge: 2016-11-11 | Disposition: A | Discharge status: Home / Self Care | Payer: Medicare Other | Source: Ambulatory Visit | Attending: Internal Medicine | Admitting: Internal Medicine

## 2016-11-11 ENCOUNTER — Encounter: Payer: Self-pay | Admitting: Radiology

## 2016-11-11 DIAGNOSIS — Z1231 Encounter for screening mammogram for malignant neoplasm of breast: Secondary | ICD-10-CM | POA: Diagnosis present

## 2016-11-15 ENCOUNTER — Inpatient Hospital Stay: Payer: Medicare Other | Attending: Oncology | Admitting: Oncology

## 2016-11-15 ENCOUNTER — Other Ambulatory Visit: Payer: Self-pay | Admitting: *Deleted

## 2016-11-15 ENCOUNTER — Ambulatory Visit: Payer: Medicare Other

## 2016-11-15 ENCOUNTER — Ambulatory Visit
Admission: RE | Admit: 2016-11-15 | Discharge: 2016-11-15 | Disposition: A | Discharge status: Home / Self Care | Payer: Medicare Other | Source: Ambulatory Visit | Attending: Oncology | Admitting: Oncology

## 2016-11-15 ENCOUNTER — Encounter: Payer: Self-pay | Admitting: Oncology

## 2016-11-15 DIAGNOSIS — R188 Other ascites: Secondary | ICD-10-CM | POA: Diagnosis not present

## 2016-11-15 DIAGNOSIS — Z87891 Personal history of nicotine dependence: Secondary | ICD-10-CM

## 2016-11-15 DIAGNOSIS — Z122 Encounter for screening for malignant neoplasm of respiratory organs: Secondary | ICD-10-CM

## 2016-11-15 DIAGNOSIS — K7460 Unspecified cirrhosis of liver: Secondary | ICD-10-CM | POA: Insufficient documentation

## 2016-11-15 DIAGNOSIS — J439 Emphysema, unspecified: Secondary | ICD-10-CM | POA: Diagnosis not present

## 2016-11-15 DIAGNOSIS — I8500 Esophageal varices without bleeding: Secondary | ICD-10-CM | POA: Insufficient documentation

## 2016-11-15 DIAGNOSIS — I2510 Atherosclerotic heart disease of native coronary artery without angina pectoris: Secondary | ICD-10-CM | POA: Diagnosis not present

## 2016-11-15 NOTE — Progress Notes (Signed)
Personal history of tobacco use, presenting hazards to health In accordance with CMS guidelines, patient has met eligibility criteria including age, absence of signs or symptoms of lung cancer.  Social History  Substance Use Topics  . Smoking status: Current Every Day Smoker    Packs/day: 1.50    Years: 30.00  . Smokeless tobacco: Not on file  . Alcohol use Yes     Comment: occasional wine and beer     A shared decision-making session was conducted prior to the performance of CT scan. This includes one or more decision aids, includes benefits and harms of screening, follow-up diagnostic testing, over-diagnosis, false positive rate, and total radiation exposure.  Counseling on the importance of adherence to annual lung cancer LDCT screening, impact of co-morbidities, and ability or willingness to undergo diagnosis and treatment is imperative for compliance of the program.  Counseling on the importance of continued smoking cessation for former smokers; the importance of smoking cessation for current smokers, and information about tobacco cessation interventions have been given to patient including [place] and [facility] quit [place] programs.  Written order for lung cancer screening with LDCT has been given to the patient and any and all questions have been answered to the best of my abilities.   Yearly follow up will be coordinated by Burgess Estelle, Thoracic Navigator.   Lucendia Herrlich, NP  11/15/16 2:22 PM

## 2016-11-15 NOTE — Assessment & Plan Note (Signed)
In accordance with CMS guidelines, patient has met eligibility criteria including age, absence of signs or symptoms of lung cancer.  Social History  Substance Use Topics  . Smoking status: Current Every Day Smoker    Packs/day: 1.50    Years: 30.00  . Smokeless tobacco: Not on file  . Alcohol use Yes     Comment: occasional wine and beer     A shared decision-making session was conducted prior to the performance of CT scan. This includes one or more decision aids, includes benefits and harms of screening, follow-up diagnostic testing, over-diagnosis, false positive rate, and total radiation exposure.  Counseling on the importance of adherence to annual lung cancer LDCT screening, impact of co-morbidities, and ability or willingness to undergo diagnosis and treatment is imperative for compliance of the program.  Counseling on the importance of continued smoking cessation for former smokers; the importance of smoking cessation for current smokers, and information about tobacco cessation interventions have been given to patient including [place] and [facility] quit [place] programs.  Written order for lung cancer screening with LDCT has been given to the patient and any and all questions have been answered to the best of my abilities.   Yearly follow up will be coordinated by Burgess Estelle, Thoracic Navigator.

## 2016-11-17 ENCOUNTER — Telehealth: Payer: Self-pay | Admitting: *Deleted

## 2016-11-17 NOTE — Telephone Encounter (Signed)
Notified patient of LDCT lung cancer screening results with recommendation for 12 month follow up imaging. Also notified of incidental finding noted below and is encouraged to discuss these further with PCP who will receive a copy of these results via fax. Patient verbalizes understanding.   IMPRESSION: 1. Lung-RADS Category 2, benign appearance or behavior. Continue annual screening with low-dose chest CT without contrast in 12 months. 2. Emphysema. 3. Coronary artery atherosclerosis. 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 These features were better characterized on previous MRI 01/08/2016.

## 2016-12-06 ENCOUNTER — Encounter
Admission: RE | Admit: 2016-12-06 | Discharge: 2016-12-06 | Disposition: A | Discharge status: Home / Self Care | Payer: Medicare Other | Source: Ambulatory Visit | Attending: Orthopedic Surgery | Admitting: Orthopedic Surgery

## 2016-12-06 DIAGNOSIS — Z881 Allergy status to other antibiotic agents status: Secondary | ICD-10-CM | POA: Diagnosis not present

## 2016-12-06 DIAGNOSIS — Z801 Family history of malignant neoplasm of trachea, bronchus and lung: Secondary | ICD-10-CM | POA: Diagnosis not present

## 2016-12-06 DIAGNOSIS — Z9049 Acquired absence of other specified parts of digestive tract: Secondary | ICD-10-CM | POA: Diagnosis not present

## 2016-12-06 DIAGNOSIS — Z803 Family history of malignant neoplasm of breast: Secondary | ICD-10-CM | POA: Diagnosis not present

## 2016-12-06 DIAGNOSIS — J449 Chronic obstructive pulmonary disease, unspecified: Secondary | ICD-10-CM | POA: Diagnosis not present

## 2016-12-06 DIAGNOSIS — Z79899 Other long term (current) drug therapy: Secondary | ICD-10-CM | POA: Diagnosis not present

## 2016-12-06 DIAGNOSIS — Z01810 Encounter for preprocedural cardiovascular examination: Secondary | ICD-10-CM | POA: Diagnosis not present

## 2016-12-06 DIAGNOSIS — Y93B9 Activity, other involving muscle strengthening exercises: Secondary | ICD-10-CM | POA: Diagnosis not present

## 2016-12-06 DIAGNOSIS — W1789XA Other fall from one level to another, initial encounter: Secondary | ICD-10-CM | POA: Diagnosis not present

## 2016-12-06 DIAGNOSIS — S52572A Other intraarticular fracture of lower end of left radius, initial encounter for closed fracture: Secondary | ICD-10-CM | POA: Diagnosis not present

## 2016-12-06 DIAGNOSIS — F17200 Nicotine dependence, unspecified, uncomplicated: Secondary | ICD-10-CM | POA: Diagnosis not present

## 2016-12-06 DIAGNOSIS — Z91041 Radiographic dye allergy status: Secondary | ICD-10-CM | POA: Diagnosis not present

## 2016-12-06 DIAGNOSIS — K219 Gastro-esophageal reflux disease without esophagitis: Secondary | ICD-10-CM | POA: Diagnosis not present

## 2016-12-06 DIAGNOSIS — Z01812 Encounter for preprocedural laboratory examination: Secondary | ICD-10-CM | POA: Diagnosis not present

## 2016-12-06 HISTORY — DX: Personal history of other diseases of the digestive system: Z87.19

## 2016-12-06 HISTORY — DX: Malignant (primary) neoplasm, unspecified: C80.1

## 2016-12-06 HISTORY — DX: Gastro-esophageal reflux disease without esophagitis: K21.9

## 2016-12-06 HISTORY — DX: Emphysema, unspecified: J43.9

## 2016-12-06 HISTORY — DX: Anemia, unspecified: D64.9

## 2016-12-06 LAB — CBC
HCT: 39.8 % (ref 35.0–47.0)
Hemoglobin: 13.8 g/dL (ref 12.0–16.0)
MCH: 34.4 pg — AB (ref 26.0–34.0)
MCHC: 34.6 g/dL (ref 32.0–36.0)
MCV: 99.4 fL (ref 80.0–100.0)
PLATELETS: 39 10*3/uL — AB (ref 150–440)
RBC: 4 MIL/uL (ref 3.80–5.20)
RDW: 14.9 % — AB (ref 11.5–14.5)
WBC: 3 10*3/uL — ABNORMAL LOW (ref 3.6–11.0)

## 2016-12-06 LAB — POTASSIUM: POTASSIUM: 3.8 mmol/L (ref 3.5–5.1)

## 2016-12-06 LAB — DIFFERENTIAL
Basophils Absolute: 0 10*3/uL (ref 0–0.1)
Basophils Relative: 1 %
EOS ABS: 0.1 10*3/uL (ref 0–0.7)
EOS PCT: 3 %
LYMPHS PCT: 17 %
Lymphs Abs: 0.5 10*3/uL — ABNORMAL LOW (ref 1.0–3.6)
MONO ABS: 0.3 10*3/uL (ref 0.2–0.9)
Monocytes Relative: 10 %
NEUTROS PCT: 69 %
Neutro Abs: 2.1 10*3/uL (ref 1.4–6.5)

## 2016-12-06 LAB — TYPE AND SCREEN
ABO/RH(D): A POS
ANTIBODY SCREEN: NEGATIVE

## 2016-12-06 NOTE — Patient Instructions (Signed)
Your procedure is scheduled on: March 29,2018 (Thursday) Report to Same Day Surgery 2nd floor medical mall [facility] Entrance-take elevator on left to 2nd floor.  Check in with surgery information desk.) To find out your arrival time please call [phone] between 1PM - 3PM on December 07, 2016 (Wednesday)  Remember: Instructions that are not followed completely may result in serious medical risk, up to and including death, or upon the discretion of your surgeon and anesthesiologist your surgery may need to be rescheduled.    _x___ 1. Do not eat food or drink liquids after midnight. No gum chewing or  Hard candies                                 __x__ 2. No Alcohol for 24 hours before or after surgery.   __x__3. No Smoking for 24 prior to surgery.   ____  4. Bring all medications with you on the day of surgery if instructed.    __x__ 5. Notify your doctor if there is any change in your medical condition     (cold, fever, infections).     Do not wear jewelry, make-up, hairpins, clips or nail polish.  Do not wear lotions, powders, or perfumes. You may wear deodorant.  Do not shave 48 hours prior to surgery. Men may shave face and neck.  Do not bring valuables to the hospital.    [facility] is not responsible for any belongings or valuables.               Contacts, dentures or bridgework may not be worn into surgery.  Leave your suitcase in the car. After surgery it may be brought to your room.  For patients admitted to the hospital, discharge time is determined by your  treatment team                     Patients discharged the day of surgery will not be allowed to drive home.  You will need someone to drive you home and stay with you the night of your procedure.    Please read over the following fact sheets that you were given:   [facility] Preparing for Surgery and or MRSA Information   _x___ Take anti-hypertensive (unless it includes a diuretic), cardiac, seizure, asthma,      anti-reflux and psychiatric medicines. These include:  1. NADOLOL  2. PANTOPRAZOLE (PANTOPRAZOLE AT BEDTIME ON WEDNESDAY NIGHT    )  3.  4.  5.  6.  ____Fleets enema or Magnesium Citrate as directed.   _x___ Use CHG Soap or sage wipes as directed on instruction sheet   ____ Use inhalers on the day of surgery and bring to hospital day of surgery  ____ Stop Metformin and Janumet 2 days prior to surgery.    ____ Take 1/2 of usual insulin dose the night before surgery and none on the morning surgery       _x___ Follow recommendations from Cardiologist, Pulmonologist or PCP regarding          stopping Aspirin, Coumadin, Pllavix ,Eliquis, Effient, or Pradaxa, and Pletal.  X____Stop Anti-inflammatories such as Advil, Aleve, Ibuprofen, Motrin, Naproxen, Naprosyn, Goodies powders or aspirin products. OK to take Tylenol   _x___ Stop supplements until after surgery.  But may continue Vitamin D, Vitamin B,and multivitamin (STOP VITAMIN C  VITAMIN E, AND FISH OIL NOW  )  ____ Bring C-Pap to the hospital.

## 2016-12-08 ENCOUNTER — Ambulatory Visit: Payer: Medicare Other

## 2016-12-08 ENCOUNTER — Ambulatory Visit: Payer: Medicare Other | Admitting: *Deleted

## 2016-12-08 ENCOUNTER — Encounter
Admission: RE | Disposition: A | Discharge status: Home / Self Care | Payer: Self-pay | Source: Ambulatory Visit | Attending: Orthopedic Surgery

## 2016-12-08 ENCOUNTER — Encounter: Payer: Self-pay | Admitting: *Deleted

## 2016-12-08 ENCOUNTER — Ambulatory Visit
Admission: RE | Admit: 2016-12-08 | Discharge: 2016-12-08 | Disposition: A | Discharge status: Home / Self Care | Payer: Medicare Other | Source: Ambulatory Visit | Attending: Orthopedic Surgery | Admitting: Orthopedic Surgery

## 2016-12-08 DIAGNOSIS — Z881 Allergy status to other antibiotic agents status: Secondary | ICD-10-CM | POA: Insufficient documentation

## 2016-12-08 DIAGNOSIS — Y93B9 Activity, other involving muscle strengthening exercises: Secondary | ICD-10-CM | POA: Insufficient documentation

## 2016-12-08 DIAGNOSIS — J449 Chronic obstructive pulmonary disease, unspecified: Secondary | ICD-10-CM | POA: Insufficient documentation

## 2016-12-08 DIAGNOSIS — Z01812 Encounter for preprocedural laboratory examination: Secondary | ICD-10-CM | POA: Insufficient documentation

## 2016-12-08 DIAGNOSIS — Z91041 Radiographic dye allergy status: Secondary | ICD-10-CM | POA: Insufficient documentation

## 2016-12-08 DIAGNOSIS — K219 Gastro-esophageal reflux disease without esophagitis: Secondary | ICD-10-CM | POA: Insufficient documentation

## 2016-12-08 DIAGNOSIS — Z803 Family history of malignant neoplasm of breast: Secondary | ICD-10-CM | POA: Insufficient documentation

## 2016-12-08 DIAGNOSIS — Z9049 Acquired absence of other specified parts of digestive tract: Secondary | ICD-10-CM | POA: Insufficient documentation

## 2016-12-08 DIAGNOSIS — Z8781 Personal history of (healed) traumatic fracture: Secondary | ICD-10-CM

## 2016-12-08 DIAGNOSIS — Z79899 Other long term (current) drug therapy: Secondary | ICD-10-CM | POA: Insufficient documentation

## 2016-12-08 DIAGNOSIS — F17200 Nicotine dependence, unspecified, uncomplicated: Secondary | ICD-10-CM | POA: Insufficient documentation

## 2016-12-08 DIAGNOSIS — Z01810 Encounter for preprocedural cardiovascular examination: Secondary | ICD-10-CM | POA: Insufficient documentation

## 2016-12-08 DIAGNOSIS — S52572A Other intraarticular fracture of lower end of left radius, initial encounter for closed fracture: Secondary | ICD-10-CM | POA: Diagnosis not present

## 2016-12-08 DIAGNOSIS — W1789XA Other fall from one level to another, initial encounter: Secondary | ICD-10-CM | POA: Insufficient documentation

## 2016-12-08 DIAGNOSIS — Z801 Family history of malignant neoplasm of trachea, bronchus and lung: Secondary | ICD-10-CM | POA: Insufficient documentation

## 2016-12-08 DIAGNOSIS — Z9889 Other specified postprocedural states: Secondary | ICD-10-CM

## 2016-12-08 HISTORY — PX: OPEN REDUCTION INTERNAL FIXATION (ORIF) DISTAL RADIAL FRACTURE: SHX5989

## 2016-12-08 SURGERY — OPEN REDUCTION INTERNAL FIXATION (ORIF) DISTAL RADIUS FRACTURE
Anesthesia: General | Laterality: Left | Wound class: Clean

## 2016-12-08 MED ORDER — CLINDAMYCIN PHOSPHATE 600 MG/50ML IV SOLN
INTRAVENOUS | Status: AC
  Filled 2016-12-08: qty 50

## 2016-12-08 MED ORDER — SODIUM CHLORIDE 0.9 % IJ SOLN
INTRAMUSCULAR | Status: AC
  Filled 2016-12-08: qty 10

## 2016-12-08 MED ORDER — PROPOFOL 10 MG/ML IV BOLUS
INTRAVENOUS | Status: DC | PRN
  Administered 2016-12-08: 110 mg via INTRAVENOUS

## 2016-12-08 MED ORDER — ONDANSETRON HCL 4 MG/2ML IJ SOLN: 4.0000 mg | Freq: Once | INTRAMUSCULAR | Status: DC | PRN

## 2016-12-08 MED ORDER — OXYCODONE HCL 5 MG PO TABS
5.0000 mg | ORAL_TABLET | ORAL | Status: DC | PRN
  Administered 2016-12-08: 5 mg via ORAL

## 2016-12-08 MED ORDER — SUGAMMADEX SODIUM 200 MG/2ML IV SOLN
INTRAVENOUS | Status: DC | PRN
  Administered 2016-12-08: 163.2 mg via INTRAVENOUS

## 2016-12-08 MED ORDER — FENTANYL CITRATE (PF) 100 MCG/2ML IJ SOLN
INTRAMUSCULAR | Status: DC | PRN
  Administered 2016-12-08: 100 ug via INTRAVENOUS

## 2016-12-08 MED ORDER — FENTANYL CITRATE (PF) 100 MCG/2ML IJ SOLN
INTRAMUSCULAR | Status: AC
  Administered 2016-12-08: 25 ug via INTRAVENOUS
  Filled 2016-12-08: qty 2

## 2016-12-08 MED ORDER — ONDANSETRON HCL 4 MG/2ML IJ SOLN
INTRAMUSCULAR | Status: AC
  Filled 2016-12-08: qty 2

## 2016-12-08 MED ORDER — ONDANSETRON HCL 4 MG/2ML IJ SOLN
INTRAMUSCULAR | Status: DC | PRN
  Administered 2016-12-08: 4 mg via INTRAVENOUS

## 2016-12-08 MED ORDER — ALBUTEROL SULFATE HFA 108 (90 BASE) MCG/ACT IN AERS
INHALATION_SPRAY | RESPIRATORY_TRACT | Status: AC
  Filled 2016-12-08: qty 6.7

## 2016-12-08 MED ORDER — SUCCINYLCHOLINE CHLORIDE 20 MG/ML IJ SOLN
INTRAMUSCULAR | Status: AC
  Filled 2016-12-08: qty 1

## 2016-12-08 MED ORDER — HYDROMORPHONE HCL 1 MG/ML IJ SOLN
INTRAMUSCULAR | Status: AC
  Administered 2016-12-08: 0.25 mg via INTRAVENOUS
  Filled 2016-12-08: qty 1

## 2016-12-08 MED ORDER — NEOMYCIN-POLYMYXIN B GU 40-200000 IR SOLN
Status: AC
  Filled 2016-12-08: qty 2

## 2016-12-08 MED ORDER — EVICEL 5 ML EX KIT
PACK | CUTANEOUS | Status: DC | PRN
  Administered 2016-12-08: 5 mL

## 2016-12-08 MED ORDER — ROCURONIUM BROMIDE 100 MG/10ML IV SOLN
INTRAVENOUS | Status: DC | PRN
  Administered 2016-12-08: 20 mg via INTRAVENOUS

## 2016-12-08 MED ORDER — FENTANYL CITRATE (PF) 100 MCG/2ML IJ SOLN
25.0000 ug | INTRAMUSCULAR | Status: DC | PRN
  Administered 2016-12-08 (×4): 25 ug via INTRAVENOUS

## 2016-12-08 MED ORDER — FENTANYL CITRATE (PF) 100 MCG/2ML IJ SOLN
INTRAMUSCULAR | Status: AC
  Filled 2016-12-08: qty 2

## 2016-12-08 MED ORDER — MIDAZOLAM HCL 2 MG/2ML IJ SOLN
INTRAMUSCULAR | Status: AC
  Filled 2016-12-08: qty 2

## 2016-12-08 MED ORDER — EPHEDRINE SULFATE 50 MG/ML IJ SOLN
INTRAMUSCULAR | Status: AC
  Administered 2016-12-08: 15 mg via INTRAVENOUS
  Filled 2016-12-08: qty 1

## 2016-12-08 MED ORDER — PROPOFOL 10 MG/ML IV BOLUS
INTRAVENOUS | Status: AC
  Filled 2016-12-08: qty 40

## 2016-12-08 MED ORDER — MIDAZOLAM HCL 2 MG/2ML IJ SOLN
INTRAMUSCULAR | Status: DC | PRN
  Administered 2016-12-08: 2 mg via INTRAVENOUS

## 2016-12-08 MED ORDER — EPHEDRINE SULFATE 50 MG/ML IJ SOLN
15.0000 mg | Freq: Once | INTRAMUSCULAR | Status: AC
  Administered 2016-12-08: 15 mg via INTRAVENOUS

## 2016-12-08 MED ORDER — LACTATED RINGERS IV SOLN
INTRAVENOUS | Status: DC
  Administered 2016-12-08: 09:00:00 via INTRAVENOUS

## 2016-12-08 MED ORDER — OXYCODONE HCL 5 MG PO TABS: 5.0000 mg | ORAL_TABLET | ORAL | 0 refills | Status: DC | PRN

## 2016-12-08 MED ORDER — CLINDAMYCIN PHOSPHATE 900 MG/50ML IV SOLN
900.0000 mg | Freq: Once | INTRAVENOUS | Status: AC
  Administered 2016-12-08: 600 mg via INTRAVENOUS

## 2016-12-08 MED ORDER — OXYCODONE HCL 5 MG PO TABS
ORAL_TABLET | ORAL | Status: AC
  Filled 2016-12-08: qty 1

## 2016-12-08 MED ORDER — HYDROMORPHONE HCL 1 MG/ML IJ SOLN
0.2500 mg | INTRAMUSCULAR | Status: DC | PRN
  Administered 2016-12-08: 0.25 mg via INTRAVENOUS
  Administered 2016-12-08: 0.5 mg via INTRAVENOUS
  Administered 2016-12-08 (×3): 0.25 mg via INTRAVENOUS
  Administered 2016-12-08: 0.5 mg via INTRAVENOUS

## 2016-12-08 MED ORDER — SUCCINYLCHOLINE CHLORIDE 20 MG/ML IJ SOLN
INTRAMUSCULAR | Status: DC | PRN
  Administered 2016-12-08: 100 mg via INTRAVENOUS

## 2016-12-08 MED ORDER — NEOMYCIN-POLYMYXIN B GU 40-200000 IR SOLN
Status: DC | PRN
  Administered 2016-12-08: 2 mL

## 2016-12-08 SURGICAL SUPPLY — 45 items
BANDAGE ACE 4X5 VEL STRL LF (GAUZE/BANDAGES/DRESSINGS) ×3 IMPLANT
BIT DRILL 2 FAST STEP (BIT) ×3 IMPLANT
BIT DRILL 2.5X4 QC (BIT) ×3 IMPLANT
CANISTER SUCT 1200ML W/VALVE (MISCELLANEOUS) ×3 IMPLANT
CHLORAPREP W/TINT 26ML (MISCELLANEOUS) ×3 IMPLANT
CUFF TOURN 18 STER (MISCELLANEOUS) IMPLANT
DRAPE FLUOR MINI C-ARM 54X84 (DRAPES) ×3 IMPLANT
ELECT REM PT RETURN 9FT ADLT (ELECTROSURGICAL) ×3
ELECTRODE REM PT RTRN 9FT ADLT (ELECTROSURGICAL) ×1 IMPLANT
GAUZE PETRO XEROFOAM 1X8 (MISCELLANEOUS) ×6 IMPLANT
GAUZE SPONGE 4X4 12PLY STRL (GAUZE/BANDAGES/DRESSINGS) ×3 IMPLANT
GLOVE BIOGEL PI IND STRL 9 (GLOVE) ×1 IMPLANT
GLOVE BIOGEL PI INDICATOR 9 (GLOVE) ×2
GLOVE SURG SYN 9.0  PF PI (GLOVE) ×2
GLOVE SURG SYN 9.0 PF PI (GLOVE) ×1 IMPLANT
GOWN SRG 2XL LVL 4 RGLN SLV (GOWNS) ×1 IMPLANT
GOWN STRL NON-REIN 2XL LVL4 (GOWNS) ×2
GOWN STRL REUS W/ TWL LRG LVL3 (GOWN DISPOSABLE) ×1 IMPLANT
GOWN STRL REUS W/TWL LRG LVL3 (GOWN DISPOSABLE) ×2
K-WIRE 1.6 (WIRE) ×2
K-WIRE FX5X1.6XNS BN SS (WIRE) ×1
KIT RM TURNOVER STRD PROC AR (KITS) ×3 IMPLANT
KWIRE FX5X1.6XNS BN SS (WIRE) ×1 IMPLANT
NEEDLE FILTER BLUNT 18X 1/2SAF (NEEDLE) ×2
NEEDLE FILTER BLUNT 18X1 1/2 (NEEDLE) ×1 IMPLANT
NS IRRIG 500ML POUR BTL (IV SOLUTION) ×3 IMPLANT
PACK EXTREMITY ARMC (MISCELLANEOUS) ×3 IMPLANT
PAD CAST CTTN 4X4 STRL (SOFTGOODS) ×2 IMPLANT
PADDING CAST COTTON 4X4 STRL (SOFTGOODS) ×4
PEG SUBCHONDRAL SMOOTH 2.0X14 (Peg) ×3 IMPLANT
PEG SUBCHONDRAL SMOOTH 2.0X20 (Peg) ×3 IMPLANT
PEG SUBCHONDRAL SMOOTH 2.0X22 (Peg) ×9 IMPLANT
PEG SUBCHONDRAL SMOOTH 2.0X24 (Peg) ×3 IMPLANT
PLATE SHORT 21.6X48.9 NRRW LT (Plate) ×3 IMPLANT
SCREW BN 12X3.5XNS CORT TI (Screw) ×1 IMPLANT
SCREW CORT 3.5X10 LNG (Screw) ×6 IMPLANT
SCREW CORT 3.5X12 (Screw) ×2 IMPLANT
SPLINT CAST 1 STEP 3X12 (MISCELLANEOUS) ×3 IMPLANT
STOCKINETTE STRL 4IN 9604848 (GAUZE/BANDAGES/DRESSINGS) ×3 IMPLANT
SUT ETHILON 4-0 (SUTURE) ×2
SUT ETHILON 4-0 FS2 18XMFL BLK (SUTURE) ×1
SUT VICRYL 3-0 27IN (SUTURE) ×3 IMPLANT
SUTURE ETHLN 4-0 FS2 18XMF BLK (SUTURE) ×1 IMPLANT
SYR 3ML LL SCALE MARK (SYRINGE) ×3 IMPLANT
TIP RIGID 35CM EVICEL (HEMOSTASIS) ×3 IMPLANT

## 2016-12-08 NOTE — Anesthesia Preprocedure Evaluation (Signed)
Anesthesia Evaluation  Patient identified by MRN, date of birth, ID band Patient awake    Reviewed: Allergy & Precautions, NPO status , Patient's Chart, lab work & pertinent test results, reviewed documented beta blocker date and time   Airway Mallampati: II  TM Distance: >3 FB     Dental  (+) Chipped   Pulmonary COPD, Current Smoker,    Pulmonary exam normal        Cardiovascular negative cardio ROS Normal cardiovascular exam     Neuro/Psych negative neurological ROS  negative psych ROS   GI/Hepatic GERD  Medicated,(+) Cirrhosis   Esophageal Varices    , Hepatitis -, C  Endo/Other    Renal/GU   negative genitourinary   Musculoskeletal   Abdominal Normal abdominal exam  (+)   Peds negative pediatric ROS (+)  Hematology   Anesthesia Other Findings   Reproductive/Obstetrics                             Anesthesia Physical  Anesthesia Plan  ASA: III  Anesthesia Plan: General   Post-op Pain Management:    Induction: Intravenous  Airway Management Planned: Oral ETT  Additional Equipment:   Intra-op Plan:   Post-operative Plan: Extubation in OR  Informed Consent: I have reviewed the patients History and Physical, chart, labs and discussed the procedure including the risks, benefits and alternatives for the proposed anesthesia with the patient or authorized representative who has indicated his/her understanding and acceptance.     Plan Discussed with: CRNA and Surgeon  Anesthesia Plan Comments:         Anesthesia Quick Evaluation

## 2016-12-08 NOTE — Op Note (Signed)
12/08/2016  10:33 AM  PATIENT:  Michelle Acevedo  66 y.o. female  PRE-OPERATIVE DIAGNOSIS:  other closed fracture of distal end of left radius, intra-articular 2 fragments  POST-OPERATIVE DIAGNOSIS:  other closed fracture of distal end of left radius and try articular 2 fragments  PROCEDURE:  Procedure(s): OPEN REDUCTION INTERNAL FIXATION (ORIF) DISTAL RADIAL FRACTURE (Left)  SURGEON: Laurene Footman, MD  ASSISTANTS: None  ANESTHESIA:   general  EBL:  Total I/O In: 503.5 [Blood:503.5] Out: -   BLOOD ADMINISTERED:none  DRAINS: none   LOCAL MEDICATIONS USED:  NONE  SPECIMEN:  No Specimen  DISPOSITION OF SPECIMEN:  N/A  COUNTS:  YES  TOURNIQUET:  36 min at 250 mm Hg  IMPLANTS: [employer] hand [employer] short narrow DVR plate with multiple pegs and screws  DICTATION: .Dragon Dictation   patient brought the operating room and after adequate anesthesia was obtained the left arm was prepped and draped in sterile fashion. After patient identification and timeout procedures were completed, tourniquet was raised. Volar approach is made centered over the FCR tendon. Tendon sheath incised and the tendon retracted radially. Deep tendon sheath was incised and the muscles retracted ulnarly with exposure of the pronator muscle. The pronator was elevated and the fracture exposed. I do*the case fingertrap traction was applied with 10 pounds to help get indirect reduction. A freer elevator was used to help get the distal fragment more volar as it was quite posterior. The angulation was still present and so a distal first approach was utilized with a short narrow DVR plate applied and the distal peg holes filled. After filling these the plate was brought forward and it appeared that near anatomic restoration of volar tilt radial inclination and length of been obtained with 3 cortical screws placed through the plate. Traction was released and under fluoroscopic exam there was good motion and stability  to the fracture the wounds were irrigated. The wound was then filled with evicell and allowed to sit for 5 minutes..  Wound was closed with a 3-0  V-loc subcutaneously with Dermabond for the skin. Telfa was applied over the incision with 4 x 4s, web roll and a volar splint  applied followed by an Ace wrap. Tourniquet was then let down  PLAN OF CARE: Discharge to home after PACU  PATIENT DISPOSITION:  PACU - hemodynamically stable.

## 2016-12-08 NOTE — Anesthesia Post-op Follow-up Note (Cosign Needed)
Anesthesia QCDR form completed.        

## 2016-12-08 NOTE — Discharge Instructions (Addendum)
Work fingers is much as possible postop. Ice to the back of the wrist today and tomorrow.  AMBULATORY SURGERY  DISCHARGE INSTRUCTIONS   1) The drugs that you were given will stay in your system until tomorrow so for the next 24 hours you should not:  A) Drive an automobile B) Make any legal decisions C) Drink any alcoholic beverage   2) You may resume regular meals tomorrow.  Today it is better to start with liquids and gradually work up to solid foods.  You may eat anything you prefer, but it is better to start with liquids, then soup and crackers, and gradually work up to solid foods.   3) Please notify your doctor immediately if you have any unusual bleeding, trouble breathing, redness and pain at the surgery site, drainage, fever, or pain not relieved by medication.    4) Additional Instructions:   Please contact your physician with any problems or Same Day Surgery at [phone], Monday through Friday 6 am to 4 pm, or  at [facility] number at [phone].

## 2016-12-08 NOTE — Anesthesia Procedure Notes (Signed)
Procedure Name: Intubation Date/Time: 12/08/2016 9:26 AM Performed by: Jennette Bill Pre-anesthesia Checklist: Patient identified, Emergency Drugs available, Suction available, Patient being monitored and Timeout performed Patient Re-evaluated:Patient Re-evaluated prior to inductionOxygen Delivery Method: Circle system utilized Preoxygenation: Pre-oxygenation with 100% oxygen Intubation Type: IV induction Ventilation: Mask ventilation without difficulty Laryngoscope Size: Mac and 3 Grade View: Grade II Tube type: Oral Tube size: 7.0 mm Number of attempts: 1 Airway Equipment and Method: Stylet Placement Confirmation: ETT inserted through vocal cords under direct vision Secured at: 22 cm Tube secured with: Tape Dental Injury: Teeth and Oropharynx as per pre-operative assessment

## 2016-12-08 NOTE — Anesthesia Postprocedure Evaluation (Signed)
Anesthesia Post Note  Patient: Michelle Acevedo  Procedure(s) Performed: Procedure(s) (LRB): OPEN REDUCTION INTERNAL FIXATION (ORIF) DISTAL RADIAL FRACTURE (Left)  Patient location during evaluation: PACU Anesthesia Type: General Level of consciousness: awake and alert and oriented Pain management: pain level controlled Vital Signs Assessment: post-procedure vital signs reviewed and stable Respiratory status: spontaneous breathing Cardiovascular status: blood pressure returned to baseline Anesthetic complications: no     Last Vitals:  Vitals:   12/08/16 1235 12/08/16 1308  BP: (!) 114/54 115/63  Pulse: 60 60  Resp: 16 14  Temp: 36.4 C     Last Pain:  Vitals:   12/08/16 1308  TempSrc:   PainSc: 2                  Cumi Sanagustin

## 2016-12-08 NOTE — OR Nursing (Signed)
Do not nd to re check platelets prior to going to OR per Dr. Kayleen Memos verbal @ [phone]

## 2016-12-08 NOTE — H&P (Signed)
Reviewed paper H+P, will be scanned into chart. Patient examined No changes noted.  

## 2016-12-08 NOTE — Transfer of Care (Signed)
Immediate Anesthesia Transfer of Care Note  Patient: Michelle Acevedo  Procedure(s) Performed: Procedure(s): OPEN REDUCTION INTERNAL FIXATION (ORIF) DISTAL RADIAL FRACTURE (Left)  Patient Location: PACU  Anesthesia Type:General  Level of Consciousness: awake, alert  and oriented  Airway & Oxygen Therapy: Patient Spontanous Breathing and Patient connected to face mask oxygen  Post-op Assessment: Report given to RN and Post -op Vital signs reviewed and stable  Post vital signs: Reviewed and stable  Last Vitals:  Vitals:   12/08/16 0905 12/08/16 1040  BP: (!) 158/82 (!) 108/57  Pulse: 72 64  Resp: 16 (!) 7  Temp: 36.6 C (!) 35.7 C    Last Pain:  Vitals:   12/08/16 0905  TempSrc: Oral  PainSc:          Complications: No apparent anesthesia complications

## 2016-12-09 ENCOUNTER — Encounter: Payer: Self-pay | Admitting: Orthopedic Surgery

## 2016-12-09 LAB — BPAM PLATELET PHERESIS
Blood Product Expiration Date: 201803312359
Blood Product Expiration Date: 201803312359
ISSUE DATE / TIME: 201803290737
ISSUE DATE / TIME: 201803290838
UNIT TYPE AND RH: 6200
Unit Type and Rh: 7300

## 2016-12-09 LAB — PREPARE PLATELET PHERESIS
UNIT DIVISION: 0
Unit division: 0

## 2017-01-30 ENCOUNTER — Other Ambulatory Visit: Payer: Self-pay | Admitting: Orthopedic Surgery

## 2017-01-30 DIAGNOSIS — M2392 Unspecified internal derangement of left knee: Secondary | ICD-10-CM

## 2017-03-07 ENCOUNTER — Ambulatory Visit
Admission: RE | Admit: 2017-03-07 | Discharge: 2017-03-07 | Disposition: A | Discharge status: Home / Self Care | Payer: Medicare Other | Source: Ambulatory Visit | Attending: Orthopedic Surgery | Admitting: Orthopedic Surgery

## 2017-03-07 DIAGNOSIS — M2392 Unspecified internal derangement of left knee: Secondary | ICD-10-CM | POA: Diagnosis not present

## 2017-05-24 ENCOUNTER — Other Ambulatory Visit: Payer: Self-pay | Admitting: Gastroenterology

## 2017-05-24 DIAGNOSIS — K7460 Unspecified cirrhosis of liver: Secondary | ICD-10-CM

## 2017-06-01 ENCOUNTER — Ambulatory Visit: Payer: Medicare Other

## 2017-06-13 ENCOUNTER — Ambulatory Visit
Admission: RE | Admit: 2017-06-13 | Discharge: 2017-06-13 | Disposition: A | Discharge status: Home / Self Care | Payer: Medicare Other | Source: Ambulatory Visit | Attending: Gastroenterology | Admitting: Gastroenterology

## 2017-06-13 DIAGNOSIS — Z9049 Acquired absence of other specified parts of digestive tract: Secondary | ICD-10-CM | POA: Insufficient documentation

## 2017-06-13 DIAGNOSIS — R188 Other ascites: Secondary | ICD-10-CM | POA: Insufficient documentation

## 2017-06-13 DIAGNOSIS — R161 Splenomegaly, not elsewhere classified: Secondary | ICD-10-CM | POA: Insufficient documentation

## 2017-06-13 DIAGNOSIS — K7460 Unspecified cirrhosis of liver: Secondary | ICD-10-CM | POA: Insufficient documentation

## 2017-08-15 ENCOUNTER — Other Ambulatory Visit: Payer: Self-pay | Admitting: Gastroenterology

## 2017-08-15 DIAGNOSIS — K7460 Unspecified cirrhosis of liver: Secondary | ICD-10-CM

## 2017-10-23 ENCOUNTER — Other Ambulatory Visit: Payer: Self-pay | Admitting: Internal Medicine

## 2017-10-23 DIAGNOSIS — Z1239 Encounter for other screening for malignant neoplasm of breast: Secondary | ICD-10-CM

## 2017-11-14 ENCOUNTER — Telehealth: Payer: Self-pay | Admitting: *Deleted

## 2017-11-14 DIAGNOSIS — Z122 Encounter for screening for malignant neoplasm of respiratory organs: Secondary | ICD-10-CM

## 2017-11-14 DIAGNOSIS — Z87891 Personal history of nicotine dependence: Secondary | ICD-10-CM

## 2017-11-14 NOTE — Telephone Encounter (Signed)
Left message for patient to notify them that it is time to schedule annual low dose lung cancer screening CT scan. Instructed patient to call back to verify information prior to the scan being scheduled.  

## 2017-11-14 NOTE — Telephone Encounter (Signed)
Notified patient that annual lung cancer screening low dose CT scan is due currently or will be in near future. Confirmed that patient is within the age range of 55-77, and asymptomatic, (no signs or symptoms of lung cancer). Patient denies illness that would prevent curative treatment for lung cancer if found. Verified smoking history, (former, quit 8/18, 45 pack year). The shared decision making visit was done 11/15/16. Patient is agreeable for CT scan being scheduled.

## 2017-11-21 ENCOUNTER — Ambulatory Visit
Admission: RE | Admit: 2017-11-21 | Discharge: 2017-11-21 | Disposition: A | Discharge status: Home / Self Care | Payer: Medicare Other | Source: Ambulatory Visit | Attending: Nurse Practitioner | Admitting: Nurse Practitioner

## 2017-11-21 DIAGNOSIS — I2510 Atherosclerotic heart disease of native coronary artery without angina pectoris: Secondary | ICD-10-CM | POA: Diagnosis not present

## 2017-11-21 DIAGNOSIS — R188 Other ascites: Secondary | ICD-10-CM | POA: Insufficient documentation

## 2017-11-21 DIAGNOSIS — J439 Emphysema, unspecified: Secondary | ICD-10-CM | POA: Diagnosis not present

## 2017-11-21 DIAGNOSIS — Z122 Encounter for screening for malignant neoplasm of respiratory organs: Secondary | ICD-10-CM | POA: Diagnosis not present

## 2017-11-21 DIAGNOSIS — I700 Atherosclerosis of aorta: Secondary | ICD-10-CM | POA: Insufficient documentation

## 2017-11-21 DIAGNOSIS — K766 Portal hypertension: Secondary | ICD-10-CM | POA: Insufficient documentation

## 2017-11-21 DIAGNOSIS — Z87891 Personal history of nicotine dependence: Secondary | ICD-10-CM | POA: Diagnosis present

## 2017-11-21 DIAGNOSIS — K7460 Unspecified cirrhosis of liver: Secondary | ICD-10-CM | POA: Insufficient documentation

## 2017-11-27 ENCOUNTER — Encounter: Payer: Self-pay | Admitting: *Deleted

## 2017-12-14 ENCOUNTER — Ambulatory Visit
Admission: RE | Admit: 2017-12-14 | Discharge: 2017-12-14 | Disposition: A | Discharge status: Home / Self Care | Payer: Medicare Other | Source: Ambulatory Visit | Attending: Gastroenterology | Admitting: Gastroenterology

## 2017-12-14 DIAGNOSIS — R161 Splenomegaly, not elsewhere classified: Secondary | ICD-10-CM | POA: Diagnosis not present

## 2017-12-14 DIAGNOSIS — I868 Varicose veins of other specified sites: Secondary | ICD-10-CM | POA: Diagnosis not present

## 2017-12-14 DIAGNOSIS — Z9049 Acquired absence of other specified parts of digestive tract: Secondary | ICD-10-CM | POA: Insufficient documentation

## 2017-12-14 DIAGNOSIS — K7460 Unspecified cirrhosis of liver: Secondary | ICD-10-CM | POA: Insufficient documentation

## 2018-01-15 ENCOUNTER — Ambulatory Visit
Admission: RE | Admit: 2018-01-15 | Discharge: 2018-01-15 | Disposition: A | Discharge status: Home / Self Care | Payer: Medicare Other | Source: Ambulatory Visit | Attending: Internal Medicine | Admitting: Internal Medicine

## 2018-01-15 DIAGNOSIS — Z1231 Encounter for screening mammogram for malignant neoplasm of breast: Secondary | ICD-10-CM | POA: Diagnosis present

## 2018-01-15 DIAGNOSIS — Z1239 Encounter for other screening for malignant neoplasm of breast: Secondary | ICD-10-CM

## 2018-03-31 ENCOUNTER — Other Ambulatory Visit
Admission: RE | Admit: 2018-03-31 | Discharge: 2018-03-31 | Disposition: A | Discharge status: Home / Self Care | Payer: Medicare Other | Source: Ambulatory Visit | Attending: Gastroenterology | Admitting: Gastroenterology

## 2018-03-31 DIAGNOSIS — K529 Noninfective gastroenteritis and colitis, unspecified: Secondary | ICD-10-CM | POA: Insufficient documentation

## 2018-03-31 LAB — GASTROINTESTINAL PANEL BY PCR, STOOL (REPLACES STOOL CULTURE)
ADENOVIRUS F40/41: NOT DETECTED
ASTROVIRUS: NOT DETECTED
CRYPTOSPORIDIUM: NOT DETECTED
Campylobacter species: NOT DETECTED
Cyclospora cayetanensis: NOT DETECTED
ENTAMOEBA HISTOLYTICA: NOT DETECTED
ENTEROTOXIGENIC E COLI (ETEC): NOT DETECTED
Enteroaggregative E coli (EAEC): NOT DETECTED
Enteropathogenic E coli (EPEC): NOT DETECTED
GIARDIA LAMBLIA: NOT DETECTED
Norovirus GI/GII: NOT DETECTED
Plesimonas shigelloides: NOT DETECTED
Rotavirus A: NOT DETECTED
SAPOVIRUS (I, II, IV, AND V): NOT DETECTED
SHIGA LIKE TOXIN PRODUCING E COLI (STEC): NOT DETECTED
SHIGELLA/ENTEROINVASIVE E COLI (EIEC): NOT DETECTED
Salmonella species: NOT DETECTED
VIBRIO CHOLERAE: NOT DETECTED
VIBRIO SPECIES: NOT DETECTED
YERSINIA ENTEROCOLITICA: NOT DETECTED

## 2018-03-31 LAB — C DIFFICILE QUICK SCREEN W PCR REFLEX
C DIFFICILE (CDIFF) INTERP: NOT DETECTED
C Diff antigen: NEGATIVE
C Diff toxin: NEGATIVE

## 2018-04-04 ENCOUNTER — Other Ambulatory Visit: Payer: Self-pay

## 2018-04-04 ENCOUNTER — Encounter: Payer: Self-pay | Admitting: Oncology

## 2018-04-04 ENCOUNTER — Inpatient Hospital Stay: Payer: Medicare Other

## 2018-04-04 ENCOUNTER — Inpatient Hospital Stay: Payer: Medicare Other | Attending: Oncology | Admitting: Oncology

## 2018-04-04 VITALS — BP 130/72 | HR 79 | Temp 97.6°F | Resp 18 | Wt 185.0 lb

## 2018-04-04 DIAGNOSIS — D696 Thrombocytopenia, unspecified: Secondary | ICD-10-CM | POA: Diagnosis present

## 2018-04-04 DIAGNOSIS — Z801 Family history of malignant neoplasm of trachea, bronchus and lung: Secondary | ICD-10-CM

## 2018-04-04 DIAGNOSIS — D72819 Decreased white blood cell count, unspecified: Secondary | ICD-10-CM | POA: Diagnosis present

## 2018-04-04 DIAGNOSIS — Z720 Tobacco use: Secondary | ICD-10-CM

## 2018-04-04 DIAGNOSIS — B182 Chronic viral hepatitis C: Secondary | ICD-10-CM | POA: Insufficient documentation

## 2018-04-04 DIAGNOSIS — K7460 Unspecified cirrhosis of liver: Secondary | ICD-10-CM | POA: Insufficient documentation

## 2018-04-04 DIAGNOSIS — Z803 Family history of malignant neoplasm of breast: Secondary | ICD-10-CM

## 2018-04-04 LAB — VITAMIN B12: Vitamin B-12: 1156 pg/mL — ABNORMAL HIGH (ref 180–914)

## 2018-04-04 LAB — IRON AND TIBC
Iron: 96 ug/dL (ref 28–170)
SATURATION RATIOS: 31 % (ref 10.4–31.8)
TIBC: 307 ug/dL (ref 250–450)
UIBC: 211 ug/dL

## 2018-04-04 LAB — LACTATE DEHYDROGENASE: LDH: 160 U/L (ref 98–192)

## 2018-04-04 LAB — CBC WITH DIFFERENTIAL/PLATELET
Basophils Absolute: 0 10*3/uL (ref 0–0.1)
Basophils Relative: 1 %
Eosinophils Absolute: 0.1 10*3/uL (ref 0–0.7)
Eosinophils Relative: 4 %
HEMATOCRIT: 36.8 % (ref 35.0–47.0)
HEMOGLOBIN: 12.6 g/dL (ref 12.0–16.0)
LYMPHS PCT: 20 %
Lymphs Abs: 0.3 10*3/uL — ABNORMAL LOW (ref 1.0–3.6)
MCH: 34.1 pg — AB (ref 26.0–34.0)
MCHC: 34.1 g/dL (ref 32.0–36.0)
MCV: 99.8 fL (ref 80.0–100.0)
MONOS PCT: 6 %
Monocytes Absolute: 0.1 10*3/uL — ABNORMAL LOW (ref 0.2–0.9)
NEUTROS ABS: 1.1 10*3/uL — AB (ref 1.4–6.5)
Neutrophils Relative %: 69 %
Platelets: 34 10*3/uL — ABNORMAL LOW (ref 150–440)
RBC: 3.69 MIL/uL — AB (ref 3.80–5.20)
RDW: 14.9 % — ABNORMAL HIGH (ref 11.5–14.5)
WBC: 1.5 10*3/uL — ABNORMAL LOW (ref 3.6–11.0)

## 2018-04-04 LAB — TECHNOLOGIST SMEAR REVIEW: TECH REVIEW: DECREASED

## 2018-04-04 LAB — FERRITIN: FERRITIN: 57 ng/mL (ref 11–307)

## 2018-04-04 LAB — FOLATE: Folate: 16.5 ng/mL (ref >5.9)

## 2018-04-04 NOTE — Progress Notes (Signed)
Hematology/Oncology progress note [facility] Telephone:([phone] [phone]   Patient Care Team: Glendon Axe, MD as PCP - General (Internal Medicine)  REFERRING PROVIDER: Stephens November -Gastroentololy CHIEF COMPLAINTS/REASON FOR VISIT:  Evaluation of pancytopenia  HISTORY OF PRESENTING ILLNESS:  Michelle Acevedo is a  67 y.o.  female with PMH listed below who was referred to me for evaluation of pancytopenia Patient has a history of liver cirrhosis secondary to hepatitis C, following up with gastroenterology for [facility] screening. Patient has a chronic history of pancytopenia.   Reviewed patient's previous lab records in [place] clinic, thrombocytopenia dated backing 2015, ranges from 30,000s to 40,000.  Chronic leukopenia predominantly lymphocytopenia dated back to 2015.  Hemoglobin fairly stable with baseline 12-13 Refer to hematology for evaluation of thrombocytopenia and leukopenia.  Patient had series of ultrasound done for [facility] screening.  Ultrasound showed liver has nodular border, splenomegaly 13.9 cm. Patient reports easy bruising.  Denies hematochezia, hematuria, hematemesis, epistaxis, black tarry stool. Denies weight loss, fever, chills, fatigue, night sweats.   Patient reports that she has an appointment with surgeon for evaluation of knee surgery.   Review of Systems  Constitutional: Positive for malaise/fatigue. Negative for chills, fever and weight loss.  HENT: Negative for nosebleeds and sore throat.   Eyes: Negative for double vision, photophobia and redness.  Respiratory: Negative for cough, shortness of breath and wheezing.   Cardiovascular: Negative for chest pain, palpitations and orthopnea.  Gastrointestinal: Negative for abdominal pain, blood in stool, nausea and vomiting.  Genitourinary: Negative for dysuria.  Musculoskeletal: Negative for back pain, myalgias and neck pain.  Skin: Negative for itching and rash.    Neurological: Negative for dizziness, tingling and tremors.  Endo/Heme/Allergies: Negative for environmental allergies. Bruises/bleeds easily.  Psychiatric/Behavioral: Negative for depression.    MEDICAL HISTORY:  Past Medical History:  Diagnosis Date  . Anemia   . 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 Chronic hepatitis C with cirrhosis ([place])   . Cirrhosis ([place])   . Emphysema of lung ([facility])    mild  . GERD (gastroesophageal reflux disease)   . Hepatitis C   . History of esophageal varices   . Hx of transfusion of whole blood     SURGICAL HISTORY: Past Surgical History:  Procedure Laterality Date  . ABDOMINAL HYSTERECTOMY    . BREAST CYST EXCISION Left 1999   5 cysts excised  . CHOLECYSTECTOMY    . EYE SURGERY Bilateral    Cataract Extraction with IOL  . OPEN REDUCTION INTERNAL FIXATION (ORIF) DISTAL RADIAL FRACTURE Left 12/08/2016   Procedure: OPEN REDUCTION INTERNAL FIXATION (ORIF) DISTAL RADIAL FRACTURE;  Surgeon: Hessie Knows, MD;  Location: [facility] ORS;  Service: Orthopedics;  Laterality: Left;    SOCIAL HISTORY: Social History   Socioeconomic History  . Marital status: Divorced    Spouse name: Not on file  . Number of children: Not on file  . Years of education: Not on file  . Highest education level: Not on file  Occupational History  . Occupation: retired  Scientific laboratory technician  . Financial resource strain: Not on file  . Food insecurity:    Worry: Not on file    Inability: Not on file  . Transportation needs:    Medical: Not on file    Non-medical: Not on file  Tobacco Use  . Smoking status: Current Every Day Smoker    Packs/day: 0.00    Years: 30.00    Pack years: 0.00    Types:  Cigarettes  . Smokeless tobacco: Never Used  . Tobacco comment: 2-3 cigarettes daily.  Substance and Sexual Activity  . Alcohol use: Yes    Comment: occasional wine and beer  . Drug use: No  . Sexual activity: Not Currently  Lifestyle  . Physical activity:    Days per week: Not on file     Minutes per session: Not on file  . Stress: Not on file  Relationships  . Social connections:    Talks on phone: Not on file    Gets together: Not on file    Attends religious service: Not on file    Active member of club or organization: Not on file    Attends meetings of clubs or organizations: Not on file    Relationship status: Not on file  . Intimate partner violence:    Fear of current or ex partner: Not on file    Emotionally abused: Not on file    Physically abused: Not on file    Forced sexual activity: Not on file  Other Topics Concern  . Not on file  Social History Narrative  . Not on file    FAMILY HISTORY: Family History  Problem Relation Age of Onset  . Lung cancer Mother   . Breast cancer Mother 43  . Breast cancer Sister 54  . Breast cancer Maternal Aunt   . Breast cancer Maternal Aunt     ALLERGIES:  is allergic to penicillins.  MEDICATIONS:  Current Outpatient Medications  Medication Sig Dispense Refill  . albuterol (PROVENTIL HFA) 108 (90 Base) MCG/ACT inhaler Inhale into the lungs.    . furosemide (LASIX) 40 MG tablet Take 40 mg by mouth daily as needed for fluid.     . pantoprazole (PROTONIX) 40 MG tablet Take 40 mg by mouth daily.     Marland Kitchen spironolactone (ALDACTONE) 100 MG tablet Take 100 mg by mouth daily as needed (for fluid retention).    Marland Kitchen lactulose (CHRONULAC) 10 GM/15ML solution Take 10 g by mouth 3 (three) times daily as needed (for elevated ammonia level).     No current facility-administered medications for this visit.      PHYSICAL EXAMINATION: ECOG PERFORMANCE STATUS: 1 - Symptomatic but completely ambulatory Vitals:   04/04/18 1135  BP: 130/72  Pulse: 79  Resp: 18  Temp: 97.6 F (36.4 C)   Filed Weights   04/04/18 1135  Weight: 185 lb (83.9 kg)    Physical Exam  Constitutional: She is oriented to person, place, and time. She appears well-developed and well-nourished. No distress.  HENT:  Head: Normocephalic and atraumatic.   Mouth/Throat: Oropharynx is clear and moist.  Eyes: Pupils are equal, round, and reactive to light. Conjunctivae and EOM are normal. No scleral icterus.  Neck: Normal range of motion. Neck supple.  Cardiovascular: Normal rate, regular rhythm and normal heart sounds.  Pulmonary/Chest: Effort normal and breath sounds normal. No respiratory distress. She has no wheezes. She has no rales. She exhibits no tenderness.  Abdominal: Soft. Bowel sounds are normal. She exhibits no distension. There is no tenderness.  Musculoskeletal: Normal range of motion. She exhibits no edema or deformity.  Lymphadenopathy:    She has no cervical adenopathy.  Neurological: She is alert and oriented to person, place, and time. No cranial nerve deficit. Coordination normal.  Skin: Skin is warm and dry.  Bruises on upper and lower extremities.  Psychiatric: She has a normal mood and affect. Her behavior is normal. Thought content normal.  LABORATORY DATA:  I have reviewed the data as listed Lab Results  Component Value Date   WBC 3.0 (L) 12/06/2016   HGB 13.8 12/06/2016   HCT 39.8 12/06/2016   MCV 99.4 12/06/2016   PLT 39 (L) 12/06/2016   No results for input(s): NA, K, CL, CO2, GLUCOSE, BUN, CREATININE, CALCIUM, GFRNONAA, GFRAA, PROT, ALBUMIN, AST, ALT, ALKPHOS, BILITOT, BILIDIR, IBILI in the last 8760 hours. Iron/TIBC/Ferritin/ %Sat No results found for: IRON, TIBC, FERRITIN, IRONPCTSAT   RADIOGRAPHIC STUDIES: I have personally reviewed the radiological images as listed and agreed with the findings in the report. 12/14/2017  US abdomen: coarse hepatic echotexture nodular liver. Splenomegaly 13.9 cm   ASSESSMENT & PLAN:  1. Thrombocytopenia (HCC)   2. Leukopenia, unspecified type    For the work up of patient's thrombocytopenia and neutropenia, I recommend checking CBC;CMP, LDH;  smear review, folate, Vitamin B12, hepatitis, HIV, ANA,  and monoclonal gammopathy workup.  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  If she needs invasive procedure, I recommend thrombopoietin receptor agonist  (avatrombopag 60 mg once daily for 5 days) to increased platelet counts to meet surgery criteria.  Also, discussed with the patient that if no clear etiology found- bone marrow biopsy would be suggested. Currently await for the above workup.   # Patient follow-up with me in approximately 2 weeks to review the above results.  Orders Placed This Encounter  Procedures  . CBC with Differential/Platelet    Standing Status:   Future    Standing Expiration Date:   04/05/2019  . Technologist smear review    Standing Status:   Future    Standing Expiration Date:   04/05/2019  . Flow cytometry panel-leukemia/lymphoma work-up    Standing Status:   Future    Standing Expiration Date:   04/05/2019  . Folate    Standing Status:   Future    Standing Expiration Date:   04/05/2019  . Vitamin B12    Standing Status:   Future    Standing Expiration Date:   04/05/2019  . Iron and TIBC    Standing Status:   Future    Standing Expiration Date:   04/05/2019  . Ferritin    Standing Status:   Future    Standing Expiration Date:   04/05/2019  . Lactate dehydrogenase    Standing Status:   Future    Standing Expiration Date:   04/05/2019  . Protein electrophoresis, serum    Standing Status:   Future    Standing Expiration Date:   04/05/2019  . ANA, IFA (with reflex)    Standing Status:   Future    Standing Expiration Date:   04/05/2019    All questions were answered. The patient knows to call the clinic with any problems questions or concerns.  Return of visit: 2 weeks.  Thank you for this kind referral and the opportunity to participate in the care of this patient. A copy of today's note is routed to referring provider  Total face to face encounter time for this patient visit was 45 min. >50% of the time was  spent in counseling and coordination of care.    Earlie Server, MD,  PhD Hematology Oncology [facility] at [facility] Pager- [phone] 04/04/2018

## 2018-04-04 NOTE — Progress Notes (Signed)
New pt in for pancytopenia, denies any bleeding or difficulties.

## 2018-04-05 LAB — HIV ANTIBODY (ROUTINE TESTING W REFLEX): HIV Screen 4th Generation wRfx: NONREACTIVE

## 2018-04-05 LAB — ANTINUCLEAR ANTIBODIES, IFA: ANA Ab, IFA: NEGATIVE

## 2018-04-05 LAB — PROTEIN ELECTROPHORESIS, SERUM
A/G RATIO SPE: 0.8 (ref 0.7–1.7)
ALBUMIN ELP: 2.9 g/dL (ref 2.9–4.4)
ALPHA-1-GLOBULIN: 0.2 g/dL (ref 0.0–0.4)
ALPHA-2-GLOBULIN: 0.4 g/dL (ref 0.4–1.0)
BETA GLOBULIN: 0.8 g/dL (ref 0.7–1.3)
Gamma Globulin: 2.2 g/dL — ABNORMAL HIGH (ref 0.4–1.8)
Globulin, Total: 3.6 g/dL (ref 2.2–3.9)
Total Protein ELP: 6.5 g/dL (ref 6.0–8.5)

## 2018-04-06 LAB — COMP PANEL: LEUKEMIA/LYMPHOMA

## 2018-04-13 ENCOUNTER — Other Ambulatory Visit: Payer: Self-pay | Admitting: Orthopedic Surgery

## 2018-04-13 DIAGNOSIS — M25562 Pain in left knee: Secondary | ICD-10-CM

## 2018-04-16 ENCOUNTER — Encounter: Payer: Self-pay | Admitting: Oncology

## 2018-04-16 ENCOUNTER — Other Ambulatory Visit: Payer: Self-pay

## 2018-04-16 ENCOUNTER — Inpatient Hospital Stay: Payer: Medicare Other | Attending: Oncology | Admitting: Oncology

## 2018-04-16 ENCOUNTER — Inpatient Hospital Stay: Payer: Medicare Other

## 2018-04-16 VITALS — BP 121/73 | HR 74 | Temp 97.7°F | Resp 18 | Wt 184.0 lb

## 2018-04-16 DIAGNOSIS — Z720 Tobacco use: Secondary | ICD-10-CM | POA: Insufficient documentation

## 2018-04-16 DIAGNOSIS — D696 Thrombocytopenia, unspecified: Secondary | ICD-10-CM | POA: Diagnosis present

## 2018-04-16 DIAGNOSIS — D72819 Decreased white blood cell count, unspecified: Secondary | ICD-10-CM | POA: Insufficient documentation

## 2018-04-16 DIAGNOSIS — J40 Bronchitis, not specified as acute or chronic: Secondary | ICD-10-CM | POA: Insufficient documentation

## 2018-04-16 DIAGNOSIS — J449 Chronic obstructive pulmonary disease, unspecified: Secondary | ICD-10-CM | POA: Diagnosis not present

## 2018-04-16 LAB — CBC WITH DIFFERENTIAL/PLATELET
BASOS ABS: 0 10*3/uL (ref 0–0.1)
BASOS PCT: 1 %
EOS ABS: 0.1 10*3/uL (ref 0–0.7)
EOS PCT: 4 %
HCT: 37 % (ref 35.0–47.0)
Hemoglobin: 12.4 g/dL (ref 12.0–16.0)
LYMPHS PCT: 22 %
Lymphs Abs: 0.4 10*3/uL — ABNORMAL LOW (ref 1.0–3.6)
MCH: 33.8 pg (ref 26.0–34.0)
MCHC: 33.7 g/dL (ref 32.0–36.0)
MCV: 100.3 fL — AB (ref 80.0–100.0)
MONO ABS: 0.2 10*3/uL (ref 0.2–0.9)
Monocytes Relative: 9 %
Neutro Abs: 1.1 10*3/uL — ABNORMAL LOW (ref 1.4–6.5)
Neutrophils Relative %: 64 %
PLATELETS: 37 10*3/uL — AB (ref 150–440)
RBC: 3.69 MIL/uL — AB (ref 3.80–5.20)
RDW: 15 % — AB (ref 11.5–14.5)
WBC: 1.7 10*3/uL — AB (ref 3.6–11.0)

## 2018-04-16 NOTE — Progress Notes (Signed)
Patient here for follow up. Pt just completed antibiotic treatment for bronchitis last week on Tuesday. Patient states this is the 2nd time she has had bronchitis since April.

## 2018-04-17 ENCOUNTER — Telehealth: Payer: Self-pay | Admitting: *Deleted

## 2018-04-17 MED ORDER — FLUTICASONE PROPIONATE HFA 44 MCG/ACT IN AERO: 1.0000 | INHALATION_SPRAY | Freq: Two times a day (BID) | RESPIRATORY_TRACT | 0 refills | Status: DC

## 2018-04-17 NOTE — Progress Notes (Signed)
Hematology/Oncology progress note [facility] Telephone:([phone] [phone]   Patient Care Team: Glendon Axe, MD as PCP - General (Internal Medicine)  REFERRING PROVIDER: [place] VISIT Follow up for management of pancytopenia  HISTORY OF PRESENTING ILLNESS:  Michelle Acevedo is a  67 y.o.  female with PMH listed below who was referred to me for evaluation of pancytopenia Patient has a history of liver cirrhosis secondary to hepatitis C, following up with gastroenterology for [facility] screening. Patient has a chronic history of pancytopenia.   Reviewed patient's previous lab records in [place] [facility], thrombocytopenia dated backing 2015, ranges from 30,000s to 40,000.  Chronic leukopenia predominantly lymphocytopenia dated back to 2015.  Hemoglobin fairly stable with baseline 12-13 Refer to hematology for evaluation of thrombocytopenia and leukopenia.  Patient had series of ultrasound done for [facility] screening.  Ultrasound showed liver has nodular border, splenomegaly 13.9 cm. Patient reports easy bruising.  Denies hematochezia, hematuria, hematemesis, epistaxis, black tarry stool. Denies weight loss, fever, chills, fatigue, night sweats.   Patient reports that she has an appointment with surgeon for evaluation of knee surgery.  INTERVAL HISTORY Michelle Acevedo is a 67 y.o. female who has above history reviewed by me today presents for follow up visit for management of pancytopenia She has had lab works up done and presents to discuss lab results.  # Cough and upper respiratory infection, which started about 2 weeks ago, she finished a course of Azithromycin. Still have coughing, worse at night. No associated with fever, chills.   Review of Systems  Constitutional: Positive for malaise/fatigue. Negative for chills, fever and weight loss.  HENT: Negative for nosebleeds and sore throat.   Eyes: Negative for double  vision, photophobia and redness.  Respiratory: Negative for cough, shortness of breath and wheezing.   Cardiovascular: Negative for chest pain, palpitations and orthopnea.  Gastrointestinal: Negative for abdominal pain, blood in stool, nausea and vomiting.  Genitourinary: Negative for dysuria.  Musculoskeletal: Negative for back pain, myalgias and neck pain.  Skin: Negative for itching and rash.  Neurological: Negative for dizziness, tingling and tremors.  Endo/Heme/Allergies: Negative for environmental allergies. Bruises/bleeds easily.  Psychiatric/Behavioral: Negative for depression.    MEDICAL HISTORY:  Past Medical History:  Diagnosis Date  . Anemia   . 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 Chronic hepatitis C with cirrhosis ([place])   . Cirrhosis ([place])   . Emphysema of lung (HCC)    mild  . GERD (gastroesophageal reflux disease)   . Hepatitis C   . History of esophageal varices   . Hx of transfusion of whole blood     SURGICAL HISTORY: Past Surgical History:  Procedure Laterality Date  . ABDOMINAL HYSTERECTOMY    . BREAST CYST EXCISION Left 1999   5 cysts excised  . CHOLECYSTECTOMY    . EYE SURGERY Bilateral    Cataract Extraction with IOL  . OPEN REDUCTION INTERNAL FIXATION (ORIF) DISTAL RADIAL FRACTURE Left 12/08/2016   Procedure: OPEN REDUCTION INTERNAL FIXATION (ORIF) DISTAL RADIAL FRACTURE;  Surgeon: Hessie Knows, MD;  Location: [facility] ORS;  Service: Orthopedics;  Laterality: Left;    SOCIAL HISTORY: Social History   Socioeconomic History  . Marital status: Divorced    Spouse name: Not on file  . Number of children: Not on file  . Years of education: Not on file  . Highest education level: Not on file  Occupational History  . Occupation: retired  Scientific laboratory technician  . Emergency planning/management officer  strain: Not on file  . Food insecurity:    Worry: Not on file    Inability: Not on file  . Transportation needs:    Medical: Not on file    Non-medical: Not on file  Tobacco Use  .  Smoking status: Current Every Day Smoker    Packs/day: 0.00    Years: 30.00    Pack years: 0.00    Types: Cigarettes  . Smokeless tobacco: Never Used  . Tobacco comment: 2-3 cigarettes daily.  Substance and Sexual Activity  . Alcohol use: Yes    Comment: occasional wine and beer  . Drug use: No  . Sexual activity: Not Currently  Lifestyle  . Physical activity:    Days per week: Not on file    Minutes per session: Not on file  . Stress: Not on file  Relationships  . Social connections:    Talks on phone: Not on file    Gets together: Not on file    Attends religious service: Not on file    Active member of club or organization: Not on file    Attends meetings of clubs or organizations: Not on file    Relationship status: Not on file  . Intimate partner violence:    Fear of current or ex partner: Not on file    Emotionally abused: Not on file    Physically abused: Not on file    Forced sexual activity: Not on file  Other Topics Concern  . Not on file  Social History Narrative  . Not on file    FAMILY HISTORY: Family History  Problem Relation Age of Onset  . Lung cancer Mother   . Breast cancer Mother 32  . Breast cancer Sister 44  . Breast cancer Maternal Aunt   . Breast cancer Maternal Aunt     ALLERGIES:  is allergic to penicillins.  MEDICATIONS:  Current Outpatient Medications  Medication Sig Dispense Refill  . albuterol (PROVENTIL HFA) 108 (90 Base) MCG/ACT inhaler Inhale into the lungs.    . furosemide (LASIX) 40 MG tablet Take 40 mg by mouth daily as needed for fluid.     Marland Kitchen lactulose (CHRONULAC) 10 GM/15ML solution Take 10 g by mouth 3 (three) times daily as needed (for elevated ammonia level).    . pantoprazole (PROTONIX) 40 MG tablet Take 40 mg by mouth daily.     Marland Kitchen spironolactone (ALDACTONE) 100 MG tablet Take 100 mg by mouth daily as needed (for fluid retention).    . fluticasone (FLOVENT HFA) 44 MCG/ACT inhaler Inhale 1 puff into the lungs 2 (two)  times daily. 1 Inhaler 0   No current facility-administered medications for this visit.      PHYSICAL EXAMINATION: ECOG PERFORMANCE STATUS: 1 - Symptomatic but completely ambulatory Vitals:   04/16/18 1407  BP: 121/73  Pulse: 74  Resp: 18  Temp: 97.7 F (36.5 C)   Filed Weights   04/16/18 1407  Weight: 184 lb (83.5 kg)    Physical Exam  Constitutional: She is oriented to person, place, and time. No distress.  HENT:  Head: Normocephalic and atraumatic.  Mouth/Throat: Oropharynx is clear and moist.  Eyes: Pupils are equal, round, and reactive to light. Conjunctivae and EOM are normal. No scleral icterus.  Neck: Normal range of motion. Neck supple.  Cardiovascular: Normal rate, regular rhythm and normal heart sounds.  Pulmonary/Chest: Effort normal and breath sounds normal. No respiratory distress. She has no wheezes. She has no rales. She exhibits no  tenderness.  Abdominal: Soft. Bowel sounds are normal. She exhibits no distension. There is no tenderness.  Musculoskeletal: Normal range of motion. She exhibits no edema or deformity.  Lymphadenopathy:    She has no cervical adenopathy.  Neurological: She is alert and oriented to person, place, and time. No cranial nerve deficit. Coordination normal.  Skin: Skin is warm and dry.  Bruises on upper and lower extremities.  Psychiatric: She has a normal mood and affect. Her behavior is normal. Thought content normal.     LABORATORY DATA:  I have reviewed the data as listed Lab Results  Component Value Date   WBC 1.7 (L) 04/16/2018   HGB 12.4 04/16/2018   HCT 37.0 04/16/2018   MCV 100.3 (H) 04/16/2018   PLT 37 (L) 04/16/2018   No results for input(s): NA, K, CL, CO2, GLUCOSE, BUN, CREATININE, CALCIUM, GFRNONAA, GFRAA, PROT, ALBUMIN, AST, ALT, ALKPHOS, BILITOT, BILIDIR, IBILI in the last 8760 hours. Iron/TIBC/Ferritin/ %Sat    Component Value Date/Time   IRON 96 04/04/2018 1159   TIBC 307 04/04/2018 1159   FERRITIN 57  04/04/2018 1159   IRONPCTSAT 31 04/04/2018 1159     RADIOGRAPHIC STUDIES: I have personally reviewed the radiological images as listed and agreed with the findings in the report. 12/14/2017  US abdomen: coarse hepatic echotexture nodular liver. Splenomegaly 13.9 cm   ASSESSMENT & PLAN:  1. Thrombocytopenia (HCC)   2. Leukopenia, unspecified type   3. Bronchitis   # labs were Reviewed and discussed with patient.  patient has normal folate, increase B12 level, Normal LDHSmear showed unremarkable morphology ANA negative Protein electrophoresis did not show any monoclonal protein.  SPEP showed polyclonal increase in gammaglobulin due to numerous close of plasma cells producing heterogeneous antibody, likely reactive. Flow cytometry showed not able to evaluate B-cell clonality due to nonspecific light chain binding on B cells. Discussed with patient I will repeat a flow cytometry. Clinically thrombocytopenia most likely secondary to chronic liver cirrhosis and splenomegaly. If she needs any invasive procedure, TPO can be considered prior to the invasive procedure to improve platelet counts to reach surgery threshold. Encourage patient to talk to orthopedic surgeon for evaluation. #Leukopenia, predominantly lymphocytopenia and neutropenia.  Probably secondary to chronic liver disease.  Awaiting repeat flow cytometry.  #Bronchitis COPD/encourage patient to talk to establish care with emphysema care.  Currently she only use albuterol as needed.  I will add Flovent inhaler.  Advised patient to follow-up with PCP if no improvement.  Orders Placed This Encounter  Procedures  . Flow cytometry panel-leukemia/lymphoma work-up    Standing Status:   Future    Number of Occurrences:   1    Standing Expiration Date:   05/17/2018  . CBC with Differential/Platelet    Standing Status:   Future    Number of Occurrences:   1    Standing Expiration Date:   04/17/2019    All questions were answered. The  patient knows to call the clinic with any problems questions or concerns.  Return of visit: 3 months   Earlie Server, MD, PhD Hematology Oncology [facility] at [facility] Pager- [phone] 04/17/2018

## 2018-04-17 NOTE — Telephone Encounter (Signed)
Patient called and reports that Dr Tasia Catchings was to send in prescription for steroids yesterday and it was never sent. Please advise

## 2018-04-17 NOTE — Telephone Encounter (Signed)
Patient informed prescription sent 

## 2018-04-17 NOTE — Telephone Encounter (Signed)
Ordered flovent (steroid inhaler) and Rx sent to Pharmacy.

## 2018-04-18 LAB — COMP PANEL: LEUKEMIA/LYMPHOMA

## 2018-04-27 ENCOUNTER — Ambulatory Visit: Payer: Medicare Other

## 2018-05-10 ENCOUNTER — Ambulatory Visit
Admission: RE | Admit: 2018-05-10 | Discharge: 2018-05-10 | Disposition: A | Discharge status: Home / Self Care | Payer: Medicare Other | Source: Ambulatory Visit | Attending: Orthopedic Surgery | Admitting: Orthopedic Surgery

## 2018-05-10 DIAGNOSIS — M25562 Pain in left knee: Secondary | ICD-10-CM

## 2018-05-10 DIAGNOSIS — S83282A Other tear of lateral meniscus, current injury, left knee, initial encounter: Secondary | ICD-10-CM | POA: Insufficient documentation

## 2018-07-13 ENCOUNTER — Other Ambulatory Visit: Payer: Self-pay

## 2018-07-13 DIAGNOSIS — D696 Thrombocytopenia, unspecified: Secondary | ICD-10-CM

## 2018-07-13 DIAGNOSIS — D72819 Decreased white blood cell count, unspecified: Secondary | ICD-10-CM

## 2018-07-16 ENCOUNTER — Encounter: Payer: Self-pay | Admitting: Oncology

## 2018-07-16 ENCOUNTER — Inpatient Hospital Stay: Payer: Medicare Other | Attending: Oncology | Admitting: Oncology

## 2018-07-16 ENCOUNTER — Inpatient Hospital Stay: Payer: Medicare Other

## 2018-07-16 ENCOUNTER — Other Ambulatory Visit: Payer: Self-pay

## 2018-07-16 VITALS — BP 130/78 | HR 75 | Temp 96.8°F | Resp 18 | Wt 183.2 lb

## 2018-07-16 DIAGNOSIS — Z79899 Other long term (current) drug therapy: Secondary | ICD-10-CM | POA: Diagnosis not present

## 2018-07-16 DIAGNOSIS — Z720 Tobacco use: Secondary | ICD-10-CM | POA: Insufficient documentation

## 2018-07-16 DIAGNOSIS — K7460 Unspecified cirrhosis of liver: Secondary | ICD-10-CM | POA: Insufficient documentation

## 2018-07-16 DIAGNOSIS — D72819 Decreased white blood cell count, unspecified: Secondary | ICD-10-CM

## 2018-07-16 DIAGNOSIS — D696 Thrombocytopenia, unspecified: Secondary | ICD-10-CM

## 2018-07-16 DIAGNOSIS — D61818 Other pancytopenia: Secondary | ICD-10-CM | POA: Diagnosis not present

## 2018-07-16 DIAGNOSIS — R05 Cough: Secondary | ICD-10-CM

## 2018-07-16 LAB — COMPREHENSIVE METABOLIC PANEL
ALBUMIN: 2.9 g/dL — AB (ref 3.5–5.0)
ALT: 25 U/L (ref 0–44)
AST: 56 U/L — AB (ref 15–41)
Alkaline Phosphatase: 85 U/L (ref 38–126)
Anion gap: 5 (ref 5–15)
BUN: 8 mg/dL (ref 8–23)
CO2: 25 mmol/L (ref 22–32)
Calcium: 8.5 mg/dL — ABNORMAL LOW (ref 8.9–10.3)
Chloride: 106 mmol/L (ref 98–111)
Creatinine, Ser: 0.47 mg/dL (ref 0.44–1.00)
GFR calc Af Amer: >60 mL/min (ref >60)
Glucose, Bld: 117 mg/dL — ABNORMAL HIGH (ref 70–99)
POTASSIUM: 3.4 mmol/L — AB (ref 3.5–5.1)
SODIUM: 136 mmol/L (ref 135–145)
Total Bilirubin: 3.4 mg/dL — ABNORMAL HIGH (ref 0.3–1.2)
Total Protein: 6.8 g/dL (ref 6.5–8.1)

## 2018-07-16 LAB — LACTATE DEHYDROGENASE: LDH: 165 U/L (ref 98–192)

## 2018-07-16 LAB — CBC WITH DIFFERENTIAL/PLATELET
Abs Immature Granulocytes: 0.01 10*3/uL (ref 0.00–0.07)
Basophils Absolute: 0 10*3/uL (ref 0.0–0.1)
Basophils Relative: 1 %
Eosinophils Absolute: 0.1 10*3/uL (ref 0.0–0.5)
Eosinophils Relative: 3 %
HCT: 36.6 % (ref 36.0–46.0)
HEMOGLOBIN: 12.2 g/dL (ref 12.0–15.0)
Immature Granulocytes: 1 %
LYMPHS PCT: 21 %
Lymphs Abs: 0.4 10*3/uL — ABNORMAL LOW (ref 0.7–4.0)
MCH: 33.1 pg (ref 26.0–34.0)
MCHC: 33.3 g/dL (ref 30.0–36.0)
MCV: 99.2 fL (ref 80.0–100.0)
MONO ABS: 0.2 10*3/uL (ref 0.1–1.0)
Monocytes Relative: 7 %
NEUTROS ABS: 1.5 10*3/uL — AB (ref 1.7–7.7)
Neutrophils Relative %: 67 %
Platelets: 36 10*3/uL — ABNORMAL LOW (ref 150–400)
RBC: 3.69 MIL/uL — AB (ref 3.87–5.11)
RDW: 14.7 % (ref 11.5–15.5)
WBC: 2.1 10*3/uL — AB (ref 4.0–10.5)
nRBC: 0 % (ref 0.0–0.2)

## 2018-07-16 NOTE — Progress Notes (Signed)
Patient here for follow up

## 2018-07-17 NOTE — Progress Notes (Signed)
Hematology/Oncology progress note [facility] Telephone:([phone] Fax:([phone]   Patient Care Team: Glendon Axe, MD as PCP - General (Internal Medicine)  REFERRING PROVIDER: [place] VISIT Follow up for management of pancytopenia  HISTORY OF PRESENTING ILLNESS:  Michelle Acevedo is a  67 y.o.  female with PMH listed below who was referred to me for evaluation of pancytopenia Patient has a history of liver cirrhosis secondary to hepatitis C, following up with gastroenterology for [facility] screening. Patient has a chronic history of pancytopenia.   Reviewed patient's previous lab records in [place] [facility], thrombocytopenia dated backing 2015, ranges from 30,000s to 40,000.  Chronic leukopenia predominantly lymphocytopenia dated back to 2015.  Hemoglobin fairly stable with baseline 12-13 Refer to hematology for evaluation of thrombocytopenia and leukopenia.  Patient had series of ultrasound done for [facility] screening.  Ultrasound showed liver has nodular border, splenomegaly 13.9 cm. Patient reports easy bruising.  Denies hematochezia, hematuria, hematemesis, epistaxis, black tarry stool. Denies weight loss, fever, chills, fatigue, night sweats.   Patient reports that she has an appointment with surgeon for evaluation of knee surgery.  # normal folate, increase B12 level, Normal LDHSmear showed unremarkable morphology ANA negative Protein electrophoresis did not show any monoclonal protein.  SPEP showed polyclonal increase in gammaglobulin due to numerous close of plasma cells producing heterogeneous antibody, likely reactive. Flow cytometry showed not able to evaluate B-cell clonality due to nonspecific light chain binding on B cells. Discussed with patient I will repeat a flow cytometry.   INTERVAL HISTORY Michelle Acevedo is a 67 y.o. female who has above history reviewed by me today presents for follow up visit for  management of pancytopenia.   # Cough, reports flovent has improved her respirotory symptoms. Requests refill.  # Thrombocytopenia, continues to have easy bruising. No active bleeding.  # leukopenia, reports no fever. She has chronic fatigue.  # Chronic liver disease/cirrohsis, she tell me that she has taken herself off transplant list.   Review of Systems  Constitutional: Positive for malaise/fatigue. Negative for chills, fever and weight loss.  HENT: Negative for nosebleeds and sore throat.   Eyes: Negative for double vision, photophobia and redness.  Respiratory: Negative for cough, shortness of breath and wheezing.   Cardiovascular: Negative for chest pain, palpitations and orthopnea.  Gastrointestinal: Negative for abdominal pain, blood in stool, nausea and vomiting.  Genitourinary: Negative for dysuria.  Musculoskeletal: Negative for back pain, myalgias and neck pain.  Skin: Negative for itching and rash.  Neurological: Negative for dizziness, tingling and tremors.  Endo/Heme/Allergies: Negative for environmental allergies. Bruises/bleeds easily.  Psychiatric/Behavioral: Negative for depression.    MEDICAL HISTORY:  Past Medical History:  Diagnosis Date  . Anemia   . 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 Chronic hepatitis C with cirrhosis ([place])   . Cirrhosis ([place])   . Emphysema of lung (HCC)    mild  . GERD (gastroesophageal reflux disease)   . Hepatitis C   . History of esophageal varices   . Hx of transfusion of whole blood     SURGICAL HISTORY: Past Surgical History:  Procedure Laterality Date  . ABDOMINAL HYSTERECTOMY    . BREAST CYST EXCISION Left 1999   5 cysts excised  . CHOLECYSTECTOMY    . EYE SURGERY Bilateral    Cataract Extraction with IOL  . OPEN REDUCTION INTERNAL FIXATION (ORIF) DISTAL RADIAL FRACTURE Left 12/08/2016   Procedure: OPEN REDUCTION INTERNAL FIXATION (ORIF) DISTAL RADIAL FRACTURE;  Surgeon: Hessie Knows, MD;  Location: [facility] ORS;  Service:  Orthopedics;  Laterality: Left;    SOCIAL HISTORY: Social History   Socioeconomic History  . Marital status: Divorced    Spouse name: Not on file  . Number of children: Not on file  . Years of education: Not on file  . Highest education level: Not on file  Occupational History  . Occupation: retired  Scientific laboratory technician  . Financial resource strain: Not on file  . Food insecurity:    Worry: Not on file    Inability: Not on file  . Transportation needs:    Medical: Not on file    Non-medical: Not on file  Tobacco Use  . Smoking status: Current Every Day Smoker    Packs/day: 0.00    Years: 30.00    Pack years: 0.00    Types: Cigarettes  . Smokeless tobacco: Never Used  . Tobacco comment: 2-3 cigarettes daily.  Substance and Sexual Activity  . Alcohol use: Yes    Comment: occasional wine and beer  . Drug use: No  . Sexual activity: Not Currently  Lifestyle  . Physical activity:    Days per week: Not on file    Minutes per session: Not on file  . Stress: Not on file  Relationships  . Social connections:    Talks on phone: Not on file    Gets together: Not on file    Attends religious service: Not on file    Active member of club or organization: Not on file    Attends meetings of clubs or organizations: Not on file    Relationship status: Not on file  . Intimate partner violence:    Fear of current or ex partner: Not on file    Emotionally abused: Not on file    Physically abused: Not on file    Forced sexual activity: Not on file  Other Topics Concern  . Not on file  Social History Narrative  . Not on file    FAMILY HISTORY: Family History  Problem Relation Age of Onset  . Lung cancer Mother   . Breast cancer Mother 1  . Breast cancer Sister 34  . Breast cancer Maternal Aunt   . Breast cancer Maternal Aunt     ALLERGIES:  is allergic to penicillins.  MEDICATIONS:  Current Outpatient Medications  Medication Sig Dispense Refill  . albuterol (PROVENTIL  HFA) 108 (90 Base) MCG/ACT inhaler Inhale into the lungs.    . fluticasone (FLOVENT HFA) 44 MCG/ACT inhaler Inhale 1 puff into the lungs 2 (two) times daily. 1 Inhaler 0  . furosemide (LASIX) 40 MG tablet Take 40 mg by mouth daily as needed for fluid.     Marland Kitchen lactulose (CHRONULAC) 10 GM/15ML solution Take 10 g by mouth 3 (three) times daily as needed (for elevated ammonia level).    . pantoprazole (PROTONIX) 40 MG tablet Take 40 mg by mouth daily.     Marland Kitchen spironolactone (ALDACTONE) 100 MG tablet Take 100 mg by mouth daily as needed (for fluid retention).     No current facility-administered medications for this visit.      PHYSICAL EXAMINATION: ECOG PERFORMANCE STATUS: 1 - Symptomatic but completely ambulatory Vitals:   07/16/18 1440  BP: 130/78  Pulse: 75  Resp: 18  Temp: (!) 96.8 F (36 C)   Filed Weights   07/16/18 1440  Weight: 183 lb 3.2 oz (83.1 kg)    Physical Exam  Constitutional: She is oriented  to person, place, and time. No distress.  HENT:  Head: Normocephalic and atraumatic.  Mouth/Throat: Oropharynx is clear and moist.  Eyes: Pupils are equal, round, and reactive to light. Conjunctivae and EOM are normal. No scleral icterus.  Neck: Normal range of motion. Neck supple.  Cardiovascular: Normal rate, regular rhythm and normal heart sounds.  Pulmonary/Chest: Effort normal and breath sounds normal. No respiratory distress. She has no wheezes. She has no rales. She exhibits no tenderness.  Abdominal: Soft. Bowel sounds are normal. She exhibits no distension and no mass. There is no tenderness.  Musculoskeletal: Normal range of motion. She exhibits no edema or deformity.  Lymphadenopathy:    She has no cervical adenopathy.  Neurological: She is alert and oriented to person, place, and time. No cranial nerve deficit. Coordination normal.  Skin: Skin is warm and dry. No rash noted. No erythema.  Bruises on upper and lower extremities.  Psychiatric: She has a normal mood and  affect. Her behavior is normal. Thought content normal.     LABORATORY DATA:  I have reviewed the data as listed Lab Results  Component Value Date   WBC 2.1 (L) 07/16/2018   HGB 12.2 07/16/2018   HCT 36.6 07/16/2018   MCV 99.2 07/16/2018   PLT 36 (L) 07/16/2018   Recent Labs    07/16/18 1415  NA 136  K 3.4*  CL 106  CO2 25  GLUCOSE 117*  BUN 8  CREATININE 0.47  CALCIUM 8.5*  GFRNONAA >60  GFRAA >60  PROT 6.8  ALBUMIN 2.9*  AST 56*  ALT 25  ALKPHOS 85  BILITOT 3.4*   Iron/TIBC/Ferritin/ %Sat    Component Value Date/Time   IRON 96 04/04/2018 1159   TIBC 307 04/04/2018 1159   FERRITIN 57 04/04/2018 1159   IRONPCTSAT 31 04/04/2018 1159     RADIOGRAPHIC STUDIES: I have personally reviewed the radiological images as listed and agreed with the findings in the report. 12/14/2017  US abdomen: coarse hepatic echotexture nodular liver. Splenomegaly 13.9 cm   ASSESSMENT & PLAN:  1. Thrombocytopenia [facility])   # labs reviewed and discussed with patient.   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  Stable counts. No active bleeding.   Patient follows up with orthopedic surgeon was recommended to start physical therapy. No current plan of surgery.  If she needs any invasive procedure, TPO can be considered prior to the invasive procedure to improve platelet counts to reach surgery threshold.  #Leukopenia, predominantly lymphocytopenia and neutropenia.  Counts stable. Likely due to chronic liver disease.   #Brochitis encourage she reports Flovent inhaler has helped her symptoms. I suggests patient to discuss with primary care physician and.or pulmonology for official evaluation, ie PFT.   Orders Placed This Encounter  Procedures  . CBC with Differential/Platelet    Standing Status:   Future    Standing Expiration Date:   07/17/2019    All questions were answered. The patient knows to call the clinic with any problems questions or concerns.  Return of  visit: 6 months.    Earlie Server, MD, PhD Hematology Oncology [facility] at [facility] Pager- [phone] 07/17/2018

## 2018-08-19 IMAGING — CT CT CHEST LUNG CANCER SCREENING LOW DOSE W/O CM
1 of 2 series · 15 of 40 positions shown, 19 images · non-contrast
Comparison: None.

CLINICAL DATA: 65-year-old female with 45 pack year history of
smoking. Lung cancer screening.

EXAM:
CT CHEST WITHOUT CONTRAST LOW-DOSE FOR LUNG CANCER SCREENING
TECHNIQUE: Multidetector CT imaging of the chest was performed following the
standard protocol without IV contrast.

[Series 2: axial st · axial · 0.66mm/px · z∈[-618,-354]mm · 15 of 59 slices shown, 19 images]
[im 3/59  mediastinal]
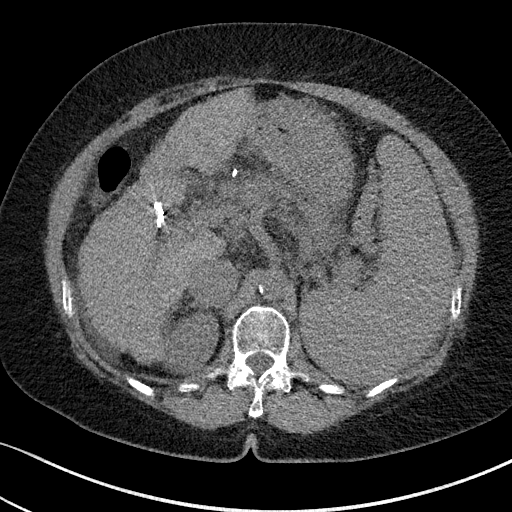
[im 3/59  lung]
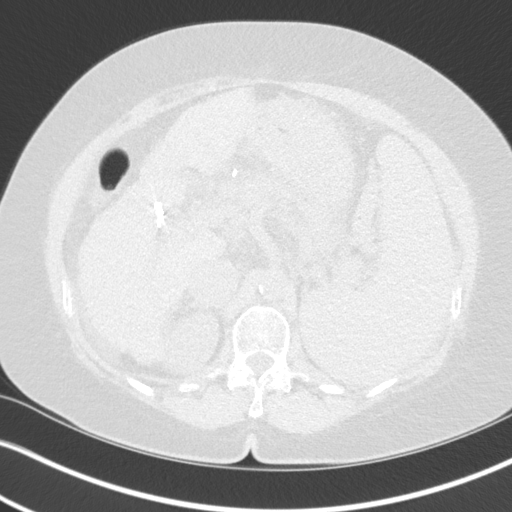
[im 7/59  lung]
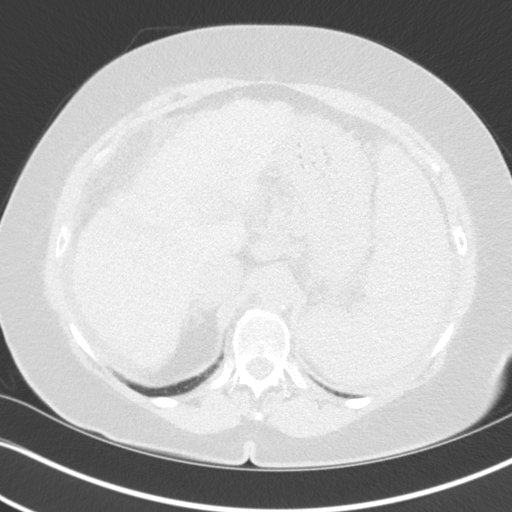
[im 12/59  lung]
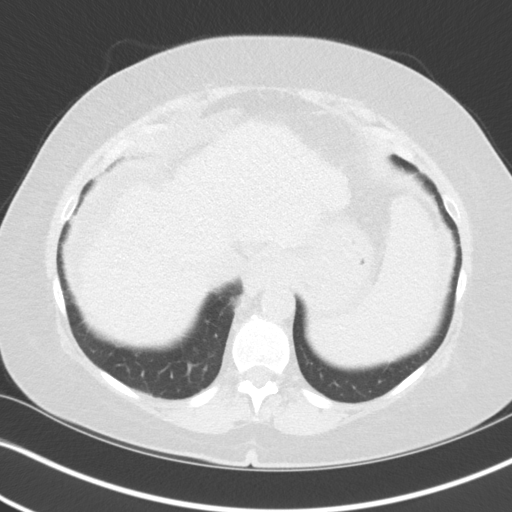
[im 15/59  lung]
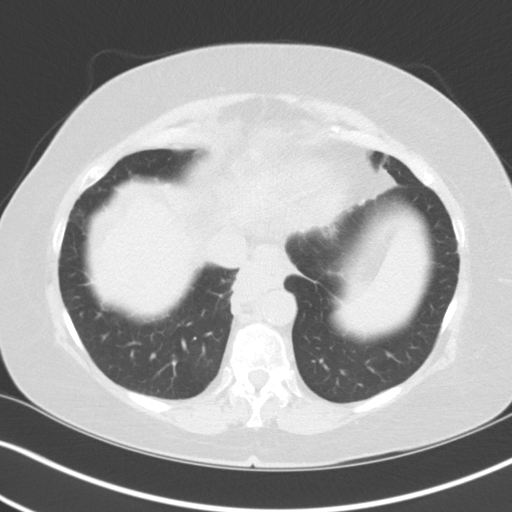
[im 18/59  mediastinal]
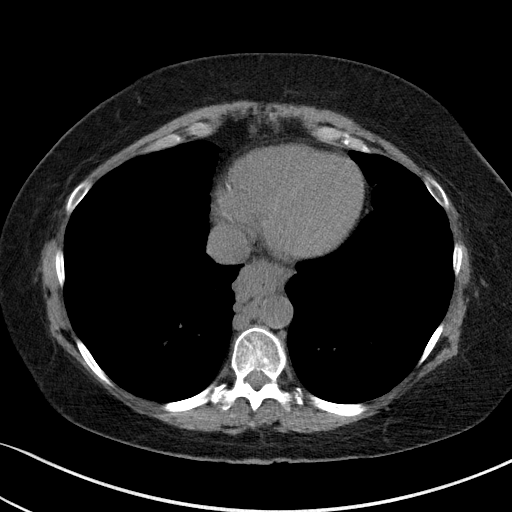
[im 18/59  lung]
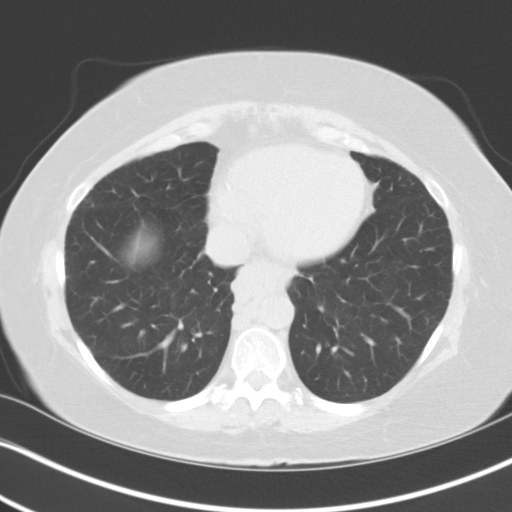
[im 23/59  lung]
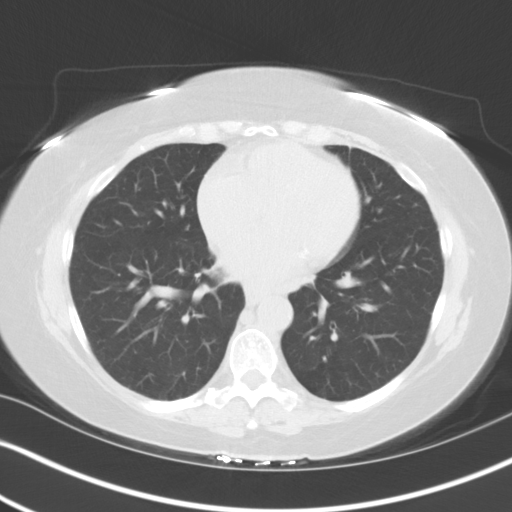
[im 27/59  lung]
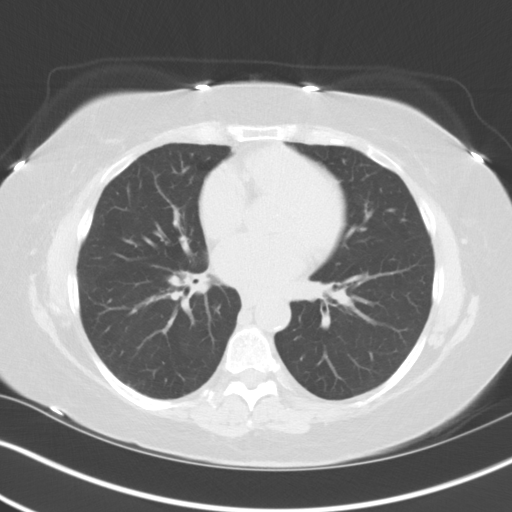
[im 30/59  lung]
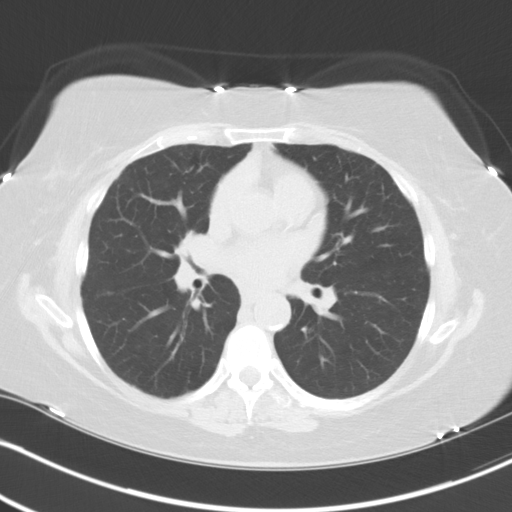
[im 32/59  mediastinal]
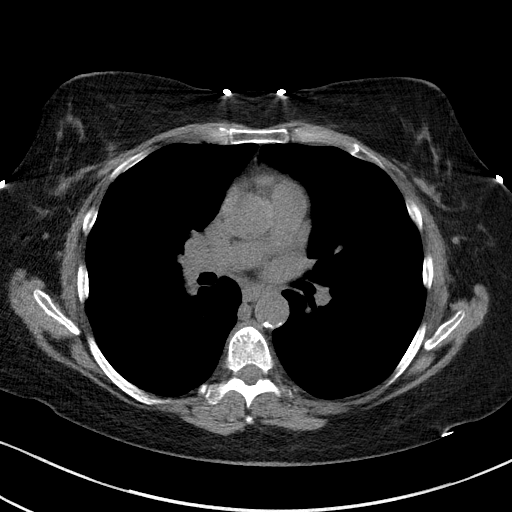
[im 32/59  lung]
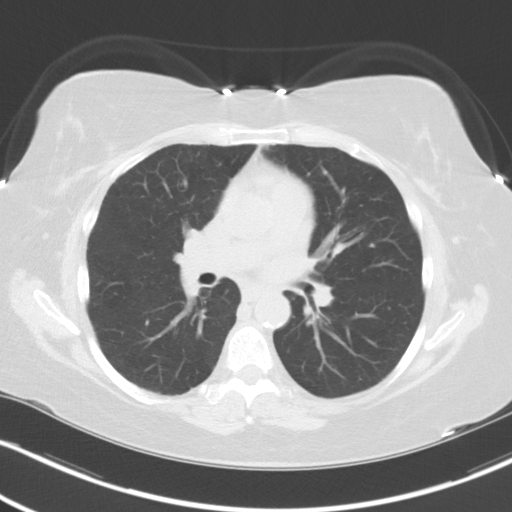
[im 36/59  lung]
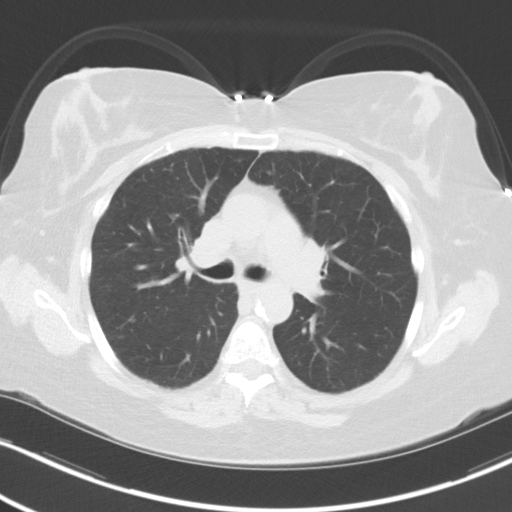
[im 41/59  lung]
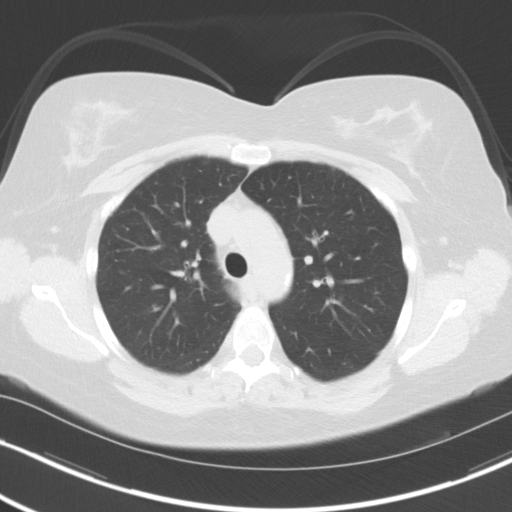
[im 44/59  lung]
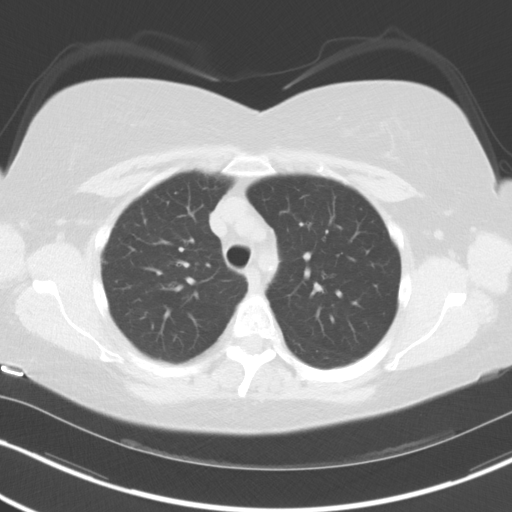
[im 47/59  mediastinal]
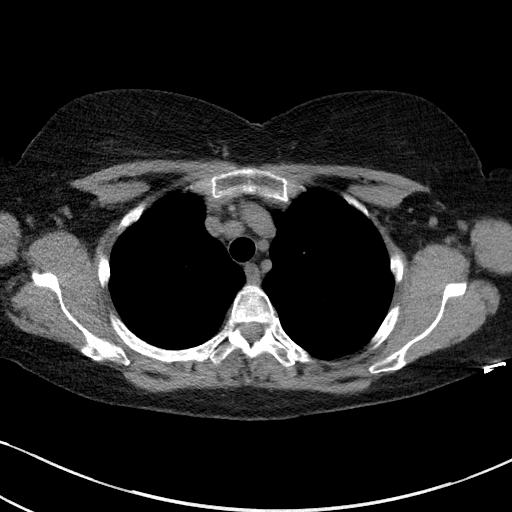
[im 47/59  lung]
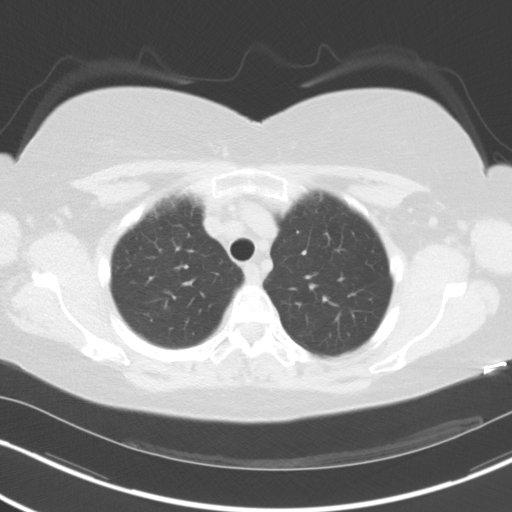
[im 52/59  lung]
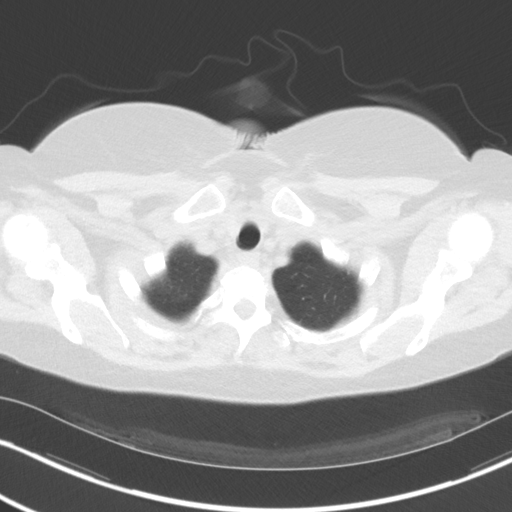
[im 56/59  lung]
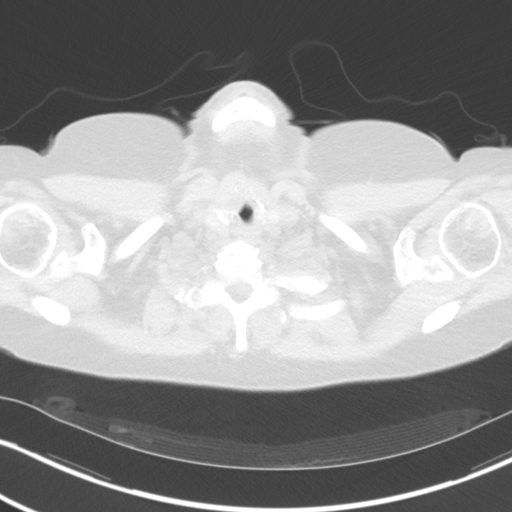

[15 of 40 positions shown; findings below may reference images not displayed]

FINDINGS: Cardiovascular: The heart size is normal. No pericardial effusion.
Coronary artery calcification is noted. Atherosclerotic
calcification is noted in the wall of the thoracic aorta.

Mediastinum/Nodes: No mediastinal lymphadenopathy. No evidence for
gross hilar lymphadenopathy although assessment is limited by the
lack of intravenous contrast on today's study. Small hiatal hernia.
There is no axillary lymphadenopathy.

Lungs/Pleura: Centrilobular emphysema noted with bronchial wall
thickening. Several scattered tiny pulmonary nodules are identified
bilaterally, some of which are calcified an consistent with
granulomata. No focal airspace consolidation. No pulmonary edema or
pleural effusion.

Upper Abdomen: Cirrhotic changes noted in the liver, better
characterized on previous MRI of 01/08/2016. Small amount of
perihepatic ascites is evident. Nodularity along the esophagogastric
junction is compatible with paraesophageal varices seen on prior
imaging.

Musculoskeletal: Bone windows reveal no worrisome lytic or sclerotic
osseous lesions.
IMPRESSION: 1. Lung-RADS Category 2, benign appearance or behavior. Continue
annual screening with low-dose chest CT without contrast in 12
months.
2. Emphysema.
3. Coronary artery atherosclerosis.
4. Cirrhosis with paraesophageal varices and small volume ascites.
These features were better characterized on previous MRI 01/08/2016.

## 2018-09-18 ENCOUNTER — Other Ambulatory Visit: Payer: Self-pay | Admitting: Gastroenterology

## 2018-09-18 DIAGNOSIS — K7460 Unspecified cirrhosis of liver: Secondary | ICD-10-CM

## 2018-09-21 ENCOUNTER — Ambulatory Visit
Admission: RE | Admit: 2018-09-21 | Discharge: 2018-09-21 | Disposition: A | Discharge status: Home / Self Care | Payer: Medicare Other | Source: Ambulatory Visit | Attending: Gastroenterology | Admitting: Gastroenterology

## 2018-09-21 DIAGNOSIS — K7460 Unspecified cirrhosis of liver: Secondary | ICD-10-CM | POA: Diagnosis not present

## 2018-11-10 ENCOUNTER — Telehealth: Payer: Self-pay

## 2018-11-10 NOTE — Telephone Encounter (Signed)
Call pt regarding lung screening. PT  Is a former smoker. Pt would like scan on any day in the afternoon. The only dates she's not open is March 14 th thur 17 th. Pt denies any new health issues at this time

## 2018-11-12 ENCOUNTER — Encounter: Payer: Self-pay | Admitting: *Deleted

## 2018-11-12 ENCOUNTER — Telehealth: Payer: Self-pay | Admitting: *Deleted

## 2018-11-12 DIAGNOSIS — Z122 Encounter for screening for malignant neoplasm of respiratory organs: Secondary | ICD-10-CM

## 2018-11-12 NOTE — Telephone Encounter (Signed)
Patient has been notified that the annual lung cancer screening low dose CT scan is due currently or will be in the near future.  Confirmed that the patient is within the age range of 26-80, and asymptomatic, and currently exhibits no signs or symptoms of lung cancer.  Patient denies illness that would prevent curative treatment for lung cancer if found.  Verified smoking history, former smoker quit 8-18 with 45 pkyr hx .  The shared decision making visit was completed on 11-15-16.  Patient is agreeable for the CT scan to be scheduled.  Will call patient back with date and time of appointment.

## 2018-11-13 ENCOUNTER — Telehealth: Payer: Self-pay | Admitting: *Deleted

## 2018-11-13 NOTE — Telephone Encounter (Signed)
CALLED PT TO INFORM HER OF HER APPT FOR LDCT SCREENING ON Friday 11/23/2018 @ 4:30pm here @ [facility]. We would like to arrive at 4:15pm, VOICED UNDERSTANDING. APPT MAILED.

## 2018-11-23 ENCOUNTER — Other Ambulatory Visit: Payer: Self-pay

## 2018-11-23 ENCOUNTER — Ambulatory Visit
Admission: RE | Admit: 2018-11-23 | Discharge: 2018-11-23 | Disposition: A | Discharge status: Home / Self Care | Payer: Medicare Other | Source: Ambulatory Visit | Attending: Nurse Practitioner | Admitting: Nurse Practitioner

## 2018-11-23 DIAGNOSIS — R918 Other nonspecific abnormal finding of lung field: Secondary | ICD-10-CM | POA: Insufficient documentation

## 2018-11-23 DIAGNOSIS — I2510 Atherosclerotic heart disease of native coronary artery without angina pectoris: Secondary | ICD-10-CM | POA: Insufficient documentation

## 2018-11-23 DIAGNOSIS — Z122 Encounter for screening for malignant neoplasm of respiratory organs: Secondary | ICD-10-CM | POA: Diagnosis present

## 2018-11-23 DIAGNOSIS — J432 Centrilobular emphysema: Secondary | ICD-10-CM | POA: Diagnosis not present

## 2018-11-23 DIAGNOSIS — I059 Rheumatic mitral valve disease, unspecified: Secondary | ICD-10-CM | POA: Diagnosis not present

## 2018-11-23 DIAGNOSIS — I700 Atherosclerosis of aorta: Secondary | ICD-10-CM | POA: Insufficient documentation

## 2018-11-23 DIAGNOSIS — Z87891 Personal history of nicotine dependence: Secondary | ICD-10-CM | POA: Diagnosis not present

## 2018-11-26 ENCOUNTER — Telehealth: Payer: Self-pay | Admitting: *Deleted

## 2018-11-26 NOTE — Telephone Encounter (Signed)
Notified patient of LDCT lung cancer screening program results with recommendation for 3 month follow up imaging. Also notified of incidental findings noted below and is encouraged to discuss further with PCP who will receive a copy of this note and/or the CT report. Patient verbalizes understanding.   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 Also reports that Dr. Teodoro Kil office has contacted her and is making arrangements for testing as recommended by radiology.    IMPRESSION: 1. Lung-RADS 0S, incomplete. There are a spectrum of findings in the lungs which can be seen with acute atypical infection (as well as other non-infectious etiologies). In particular, viral pneumonia (including COVID-19) should be considered in the appropriate clinical setting. If there is clinical concern for acute pulmonary infection, consultation with Infectious Disease or Pulmonary Medicine is recommend. Short-term follow-up in 3 months after resolution of the patient's acute illness is recommended with repeat low-dose chest CT without contrast (please use the following order, "CT CHEST LCS NODULE FOLLOW-UP W/O CM"). 2. "S" modifier above refers to potentially clinically significant non lung cancer related findings. Specifically, there is aortic atherosclerosis, in addition to left main and 3 vessel coronary artery disease. Please note that although the presence of coronary artery calcium documents the presence of coronary artery disease, the severity of this disease and any potential stenosis cannot be assessed on this non-gated CT examination. Assessment for potential risk factor modification, dietary therapy or pharmacologic therapy may be warranted, if clinically indicated. 3. Mild diffuse bronchial wall thickening with mild centrilobular and paraseptal emphysema; imaging findings suggestive of underlying COPD. 4. There are calcifications of the mitral annulus.  Echocardiographic correlation for evaluation of potential valvular dysfunction may be warranted if clinically indicated.  These results were called by telephone at the time of interpretation on 11/26/2018 at 9:42 am to Dr. Lanney Gins, who verbally acknowledged these results.  Aortic Atherosclerosis (ICD10-I70.0) and Emphysema (ICD10-J43.9).

## 2019-01-14 ENCOUNTER — Inpatient Hospital Stay: Payer: Medicare Other | Admitting: Oncology

## 2019-01-14 ENCOUNTER — Inpatient Hospital Stay: Payer: Medicare Other

## 2019-01-29 ENCOUNTER — Other Ambulatory Visit: Payer: Self-pay | Admitting: Gastroenterology

## 2019-01-29 DIAGNOSIS — K7030 Alcoholic cirrhosis of liver without ascites: Secondary | ICD-10-CM

## 2019-02-06 ENCOUNTER — Ambulatory Visit
Admission: RE | Admit: 2019-02-06 | Discharge: 2019-02-06 | Disposition: A | Discharge status: Home / Self Care | Payer: Medicare Other | Source: Ambulatory Visit | Attending: Gastroenterology | Admitting: Gastroenterology

## 2019-02-06 ENCOUNTER — Other Ambulatory Visit: Payer: Self-pay

## 2019-02-06 DIAGNOSIS — K7030 Alcoholic cirrhosis of liver without ascites: Secondary | ICD-10-CM | POA: Insufficient documentation

## 2019-02-22 ENCOUNTER — Telehealth: Payer: Self-pay | Admitting: *Deleted

## 2019-02-22 ENCOUNTER — Telehealth: Payer: Self-pay

## 2019-02-22 DIAGNOSIS — Z122 Encounter for screening for malignant neoplasm of respiratory organs: Secondary | ICD-10-CM

## 2019-02-22 DIAGNOSIS — Z87891 Personal history of nicotine dependence: Secondary | ICD-10-CM

## 2019-02-22 DIAGNOSIS — R918 Other nonspecific abnormal finding of lung field: Secondary | ICD-10-CM

## 2019-02-22 NOTE — Telephone Encounter (Signed)
Patient is agreeable for LCS nodule f/u scan to be scheduled after July 4th.

## 2019-02-22 NOTE — Telephone Encounter (Signed)
Call pt regarding lung screening. Per pt smokes once in awhile! Pt denies any new health issus. PT is out of town until after July 4th. Make appt after July 4th.

## 2019-03-18 ENCOUNTER — Ambulatory Visit
Admission: RE | Admit: 2019-03-18 | Discharge: 2019-03-18 | Disposition: A | Discharge status: Home / Self Care | Payer: Medicare Other | Source: Ambulatory Visit | Attending: Oncology | Admitting: Oncology

## 2019-03-18 ENCOUNTER — Other Ambulatory Visit: Payer: Self-pay

## 2019-03-18 DIAGNOSIS — Z122 Encounter for screening for malignant neoplasm of respiratory organs: Secondary | ICD-10-CM | POA: Diagnosis not present

## 2019-03-18 DIAGNOSIS — R918 Other nonspecific abnormal finding of lung field: Secondary | ICD-10-CM

## 2019-03-18 DIAGNOSIS — Z87891 Personal history of nicotine dependence: Secondary | ICD-10-CM | POA: Diagnosis present

## 2019-03-20 ENCOUNTER — Encounter: Payer: Self-pay | Admitting: *Deleted

## 2019-03-25 ENCOUNTER — Encounter: Payer: Self-pay | Admitting: Physician Assistant

## 2019-03-25 ENCOUNTER — Telehealth: Payer: Medicare Other | Admitting: Physician Assistant

## 2019-03-25 DIAGNOSIS — J029 Acute pharyngitis, unspecified: Secondary | ICD-10-CM | POA: Diagnosis not present

## 2019-03-25 MED ORDER — AZITHROMYCIN 250 MG PO TABS: ORAL_TABLET | ORAL | 0 refills | Status: DC

## 2019-03-25 NOTE — Progress Notes (Signed)
We are sorry that you are not feeling well.  Here is how we plan to help!  Your symptoms indicate a likely viral infection (Pharyngitis).   Pharyngitis is inflammation in the back of the throat which can cause a sore throat, scratchiness and sometimes difficulty swallowing.   Pharyngitis is typically caused by a respiratory virus and will just run its course.  Please keep in mind that your symptoms could last up to 10 days.  For throat pain, we recommend over the counter oral pain relief medications such as acetaminophen or aspirin, or anti-inflammatory medications such as ibuprofen or naproxen sodium.  Topical treatments such as oral throat lozenges or sprays may be used as needed.  Avoid close contact with loved ones, especially the very young and elderly.  Remember to wash your hands thoroughly throughout the day as this is the number one way to prevent the spread of infection and wipe down door knobs and counters with disinfectant.   Antibiotics should not be used to treat conditions that we suspect are caused by viruses like the virus that causes the common cold or flu. However, some people can have Strep with atypical symptoms.   If your symptoms persist, you may start the prescribed antibiotic, Azithromycin 250 mg, take 2 pills on first day, take one pill daily days 2-5.    You may need formal testing in clinic or office to confirm if your symptoms continue or worsen.  Providers prescribe antibiotics to treat infections caused by bacteria. Antibiotics are very powerful in treating bacterial infections when they are used properly.  To maintain their effectiveness, they should be used only when necessary.  Overuse of antibiotics has resulted in the development of super bugs that are resistant to treatment!    Home Care:  Only take medications as instructed by your medical team.  Do not drink alcohol while taking these medications.  A steam or ultrasonic humidifier can help congestion.  You  can place a towel over your head and breathe in the steam from hot water coming from a faucet.  Avoid close contacts especially the very young and the elderly.  Cover your mouth when you cough or sneeze.  Always remember to wash your hands.  Get Help Right Away If:  You develop worsening fever or throat pain.  You develop a severe head ache or visual changes.  Your symptoms persist after you have completed your treatment plan.  Make sure you  Understand these instructions.  Will watch your condition.  Will get help right away if you are not doing well or get worse.  Your e-visit answers were reviewed by a board certified advanced clinical practitioner to complete your personal care plan.  Depending on the condition, your plan could have included both over the counter or prescription medications.  If there is a problem please reply  once you have received a response from your provider.  Your safety is important to [place].  If you have drug allergies check your prescription carefully.    You can use MyChart to ask questions about todays visit, request a non-urgent call back, or ask for a work or school excuse for 24 hours related to this e-Visit. If it has been greater than 24 hours you will need to follow up with your provider, or enter a new e-Visit to address those concerns.  You will get an e-mail in the next two days asking about your experience.  I hope that your e-visit has been valuable and  will speed your recovery. Thank you for using e-visits.   I spent 5-10 minutes on review and completion of this note- Lacy Duverney [facility]

## 2019-09-24 ENCOUNTER — Other Ambulatory Visit: Payer: Self-pay | Admitting: Orthopedic Surgery

## 2019-09-25 ENCOUNTER — Other Ambulatory Visit
Admission: RE | Admit: 2019-09-25 | Discharge: 2019-09-25 | Disposition: A | Discharge status: Home / Self Care | Payer: Medicare Other | Source: Ambulatory Visit | Attending: Internal Medicine | Admitting: Internal Medicine

## 2019-09-25 DIAGNOSIS — K7460 Unspecified cirrhosis of liver: Secondary | ICD-10-CM | POA: Insufficient documentation

## 2019-09-25 LAB — AMMONIA: Ammonia: 76 umol/L — ABNORMAL HIGH (ref 9–35)

## 2019-09-26 ENCOUNTER — Encounter
Admission: RE | Admit: 2019-09-26 | Discharge: 2019-09-26 | Disposition: A | Discharge status: Home / Self Care | Payer: Medicare Other | Source: Ambulatory Visit | Attending: Orthopedic Surgery | Admitting: Orthopedic Surgery

## 2019-09-26 DIAGNOSIS — Z20822 Contact with and (suspected) exposure to covid-19: Secondary | ICD-10-CM | POA: Insufficient documentation

## 2019-09-26 DIAGNOSIS — J449 Chronic obstructive pulmonary disease, unspecified: Secondary | ICD-10-CM | POA: Diagnosis not present

## 2019-09-26 DIAGNOSIS — Z01818 Encounter for other preprocedural examination: Secondary | ICD-10-CM | POA: Insufficient documentation

## 2019-09-26 HISTORY — DX: Unspecified macular degeneration: H35.30

## 2019-09-26 HISTORY — DX: Unspecified osteoarthritis, unspecified site: M19.90

## 2019-09-26 NOTE — Pre-Procedure Instructions (Signed)
Earlie Server, MD  Physician  Oncology     Progress Notes  Signed     Encounter Date:  07/16/2018                  Signed          Expand AllCollapse All            Expand widget buttonCollapse widget button    Show:Clear all   ManualTemplateCopied  Added by:     Earlie Server, MD   Hover for detailscustomization button                                                                                                        untitled image  Hematology/Oncology progress note  [facility]  Telephone:([phone] [phone]        Patient Care Team:  Glendon Axe, MD as PCP - General (Internal Medicine)     REFERRING PROVIDER:  [place] VISIT Follow up for management of pancytopenia     HISTORY OF PRESENTING ILLNESS:   Michelle Acevedo is a  69 y.o.  female with PMH listed below who was referred to me for evaluation of pancytopenia  Patient has a history of liver cirrhosis secondary to hepatitis C, following up with gastroenterology for [facility] screening.  Patient has a chronic history of pancytopenia.    Reviewed patient's previous lab records in [place] clinic, thrombocytopenia dated backing 2015, ranges from 30,000s to 40,000.  Chronic leukopenia predominantly lymphocytopenia dated back to 2015.  Hemoglobin fairly stable with baseline 12-13  Refer to hematology for evaluation of thrombocytopenia and leukopenia.     Patient had series of ultrasound done for [facility] screening.  Ultrasound showed liver has nodular border, splenomegaly 13.9 cm.  Patient reports easy bruising.   Denies hematochezia, hematuria, hematemesis, epistaxis, black tarry stool. Denies weight loss, fever, chills, fatigue, night sweats.      Patient reports that she has an appointment with  surgeon for evaluation of knee surgery.     # normal folate, increase B12 level, Normal LDHSmear showed unremarkable morphology  ANA negative  Protein electrophoresis did not show any monoclonal protein.  SPEP showed polyclonal increase in gammaglobulin due to numerous close of plasma cells producing heterogeneous antibody, likely reactive.  Flow cytometry showed not able to evaluate B-cell clonality due to nonspecific light chain binding on B cells.  Discussed with patient I will repeat a flow cytometry.        INTERVAL HISTORY  Michelle Acevedo is a 69 y.o. female who has above history reviewed by me today presents for follow up visit for management of pancytopenia.      # Cough, reports flovent has improved her respirotory symptoms. Requests refill.   # Thrombocytopenia, continues to have easy bruising. No active bleeding.   # leukopenia, reports no fever. She has chronic fatigue.   # Chronic liver disease/cirrohsis, she tell me that she has taken herself off transplant list.      Review of Systems  Constitutional: Positive for malaise/fatigue. Negative for chills, fever and weight loss.   HENT: Negative for nosebleeds and sore throat.    Eyes: Negative for double vision, photophobia and redness.   Respiratory: Negative for cough, shortness of breath and wheezing.    Cardiovascular: Negative for chest pain, palpitations and orthopnea.   Gastrointestinal: Negative for abdominal pain, blood in stool, nausea and vomiting.   Genitourinary: Negative for dysuria.   Musculoskeletal: Negative for back pain, myalgias and neck pain.   Skin: Negative for itching and rash.   Neurological: Negative for dizziness, tingling and tremors.   Endo/Heme/Allergies: Negative for environmental allergies. Bruises/bleeds easily.   Psychiatric/Behavioral: Negative for depression.         MEDICAL HISTORY:        Past Medical History:    Diagnosis   Date    .    Anemia        .   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   Chronic hepatitis C with cirrhosis ([place])        .   Cirrhosis ([place])        .   Emphysema of lung ([facility])            mild    .   GERD (gastroesophageal reflux disease)        .   Hepatitis C        .   History of esophageal varices        .   Hx of transfusion of whole blood              SURGICAL HISTORY:        Past Surgical History:    Procedure   Laterality   Date    .   ABDOMINAL HYSTERECTOMY            .   BREAST CYST EXCISION   Left   1999        5 cysts excised    .   CHOLECYSTECTOMY            .   EYE SURGERY   Bilateral            Cataract Extraction with IOL    .   OPEN REDUCTION INTERNAL FIXATION (ORIF) DISTAL RADIAL FRACTURE   Left   12/08/2016        Procedure: OPEN REDUCTION INTERNAL FIXATION (ORIF) DISTAL RADIAL FRACTURE;  Surgeon: Hessie Knows, MD;  Location: [facility] ORS;  Service: Orthopedics;  Laterality: Left;          SOCIAL HISTORY:   Social History             Socioeconomic History    .   Marital status:   Divorced            Spouse name:   Not on file    .   Number of children:   Not on file    .   Years of education:   Not on file    .   Highest education level:   Not on file    Occupational History    .   Occupation:   retired    Scientific laboratory technician    .   Financial resource strain:   Not on file    .   Food insecurity:            Worry:  Not on file            Inability:   Not on file    .   Transportation needs:            Medical:   Not on file            Non-medical:   Not on file    Tobacco Use    .   Smoking status:   Current Every Day Smoker            Packs/day:   0.00            Years:   30.00            Pack years:   0.00             Types:   Cigarettes    .   Smokeless tobacco:   Never Used    .   Tobacco comment: 2-3 cigarettes daily.    Substance and Sexual Activity    .   Alcohol use:   Yes            Comment: occasional wine and beer    .   Drug use:   No    .   Sexual activity:   Not Currently    Lifestyle    .   Physical activity:            Days per week:   Not on file            Minutes per session:   Not on file    .   Stress:   Not on file    Relationships    .   Social connections:            Talks on phone:   Not on file            Gets together:   Not on file            Attends religious service:   Not on file            Active member of club or organization:   Not on file            Attends meetings of clubs or organizations:   Not on file            Relationship status:   Not on file    .   Intimate partner violence:            Fear of current or ex partner:   Not on file            Emotionally abused:   Not on file            Physically abused:   Not on file            Forced sexual activity:   Not on file    Other Topics   Concern    .   Not on file    Social History Narrative    .   Not on file          FAMILY HISTORY:        Family History    Problem   Relation   Age of Onset    .   Lung cancer   Mother        .   Breast cancer   Mother   83    .  Breast cancer   Sister   36    .   Breast cancer   Maternal Aunt        .   Breast cancer   Maternal Aunt              ALLERGIES:  is allergic to penicillins.     MEDICATIONS:          Current Outpatient Medications    Medication   Sig   Dispense   Refill    .   albuterol (PROVENTIL HFA) 108 (90 Base) MCG/ACT inhaler   Inhale into the lungs.            .   fluticasone (FLOVENT HFA)  44 MCG/ACT inhaler   Inhale 1 puff into the lungs 2 (two) times daily.   1 Inhaler   0    .   furosemide (LASIX) 40 MG tablet   Take 40 mg by mouth daily as needed for fluid.             Marland Kitchen   lactulose (CHRONULAC) 10 GM/15ML solution   Take 10 g by mouth 3 (three) times daily as needed (for elevated ammonia level).            .   pantoprazole (PROTONIX) 40 MG tablet   Take 40 mg by mouth daily.             Marland Kitchen   spironolactone (ALDACTONE) 100 MG tablet   Take 100 mg by mouth daily as needed (for fluid retention).                No current facility-administered medications for this visit.              PHYSICAL EXAMINATION:  ECOG PERFORMANCE STATUS: 1 - Symptomatic but completely ambulatory      Vitals:        07/16/18 1440    BP:   130/78    Pulse:   75    Resp:   18    Temp:   (!) 96.8 F (36 C)           Filed Weights        07/16/18 1440    Weight:   183 lb 3.2 oz (83.1 kg)          Physical Exam   Constitutional: She is oriented to person, place, and time. No distress.   HENT:   Head: Normocephalic and atraumatic.   Mouth/Throat: Oropharynx is clear and moist.   Eyes: Pupils are equal, round, and reactive to light. Conjunctivae and EOM are normal. No scleral icterus.   Neck: Normal range of motion. Neck supple.   Cardiovascular: Normal rate, regular rhythm and normal heart sounds.   Pulmonary/Chest: Effort normal and breath sounds normal. No respiratory distress. She has no wheezes. She has no rales. She exhibits no tenderness.   Abdominal: Soft. Bowel sounds are normal. She exhibits no distension and no mass. There is no tenderness.  Musculoskeletal: Normal range of motion. She exhibits no edema or deformity.  Lymphadenopathy:    She has no cervical adenopathy.  Neurological: She is alert and oriented to person, place, and time. No cranial nerve deficit. Coordination normal.    Skin: Skin is warm and dry. No rash noted. No erythema.  Bruises on upper and lower extremities.  Psychiatric: She has a normal mood and affect. Her behavior is normal. Thought content normal.  LABORATORY DATA:   I have reviewed the data as listed  Recent Labs                                                                                              Recent Labs (within last 365 days)                                                                                                                                          Iron/TIBC/Ferritin/ %Sat  Labs (Brief)                                                                                    RADIOGRAPHIC STUDIES:  I have personally reviewed the radiological images as listed and agreed with the findings in the report.  12/14/2017   US abdomen: coarse hepatic echotexture nodular liver. Splenomegaly 13.9 cm        ASSESSMENT & PLAN:    1.   Thrombocytopenia [facility])     # labs reviewed and discussed with patient.      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   Stable counts. No active bleeding.      Patient follows up with orthopedic surgeon was recommended to start physical therapy. No current plan of surgery.   If she needs any invasive procedure, TPO can be considered prior to the invasive procedure to improve platelet counts to reach surgery threshold.     #Leukopenia, predominantly lymphocytopenia and neutropenia.  Counts stable. Likely due to chronic liver disease.      #Brochitis  encourage she reports Flovent inhaler has helped her symptoms. I suggests patient to discuss with primary care physician and.or pulmonology for official evaluation, ie PFT.             Orders Placed This Encounter    Procedures    .   CBC with Differential/Platelet            Standing Status:     Future            Standing Expiration Date:     07/17/2019       All questions were answered. The patient knows to call the clinic with  any problems questions or concerns.     Return of visit: 6 months.         Earlie Server, MD, PhD  Hematology Oncology  [facility]- [id number]  07/17/2018          Electronically signed by Earlie Server, MD at 07/17/2018 10:48 PM             Office Visit on 07/16/2018               Detailed Report

## 2019-09-26 NOTE — Pre-Procedure Instructions (Signed)
SPOKE WITH DR Ronelle Nigh REGARDING PT WITH CIRRHOSIS OF LIVER SECONDARY TO HEP C. LABS DONE AT PCP OFFICE 09-25-19 AND SHOWED PLATELET COUNT OF 46,000. DR [id number] WANTING MEDICAL AND HEMATOLOGY CLEARANCE. CALLED AND LEFT MESSAGE FOR CASEY AT DR [facility] OFFICE REGARDING NEED FOR BOTH CLEARANCES

## 2019-09-26 NOTE — Patient Instructions (Addendum)
Your procedure is scheduled on: 10-01-19 TUESDAY Report to Same Day Surgery 2nd floor medical mall [facility] Entrance-take elevator on left to 2nd floor.  Check in with surgery information desk.) To find out your arrival time please call [phone] between 1PM - 3PM on 09-30-19 MONDAY  Remember: Instructions that are not followed completely may result in serious medical risk, up to and including death, or upon the discretion of your surgeon and anesthesiologist your surgery may need to be rescheduled.    _x___ 1. Do not eat food after midnight the night before your procedure. NO GUM OR CANDY AFTER MIDNIGHT. You may drink clear liquids up to 2 hours before you are scheduled to arrive at the hospital for your procedure.  Do not drink clear liquids within 2 hours of your scheduled arrival to the hospital.  Clear liquids include  --Water or Apple juice without pulp  -- Gatorade  --Black Coffee or Clear Tea (No milk, no creamers, do not add anything to the coffee or Tea   ____Ensure clear carbohydrate drink on the way to the hospital for bariatric patients  _X___Ensure clear carbohydrate drink 3 HOURS PRIOR TO ARRIVAL TIME TO HOSPITAL     __x__ 2. No Alcohol for 24 hours before or after surgery.   __x__3. No Smoking or e-cigarettes for 24 prior to surgery.  Do not use any chewable tobacco products for at least 6 hour prior to surgery   ____  4. Bring all medications with you on the day of surgery if instructed.    __x__ 5. Notify your doctor if there is any change in your medical condition     (cold, fever, infections).    x___6. On the morning of surgery brush your teeth with toothpaste and water.  You may rinse your mouth with mouth wash if you wish.  Do not swallow any toothpaste or mouthwash.   Do not wear jewelry, make-up, hairpins, clips or nail polish.  Do not wear lotions, powders, or perfumes.   Do not shave 48 hours prior to surgery. Men may shave face and neck.  Do not  bring valuables to the hospital.    [facility] is not responsible for any belongings or valuables.               Contacts, dentures or bridgework may not be worn into surgery.  Leave your suitcase in the car. After surgery it may be brought to your room.  For patients admitted to the hospital, discharge time is determined by your treatment team.  _  Patients discharged the day of surgery will not be allowed to drive home.  You will need someone to drive you home and stay with you the night of your procedure.    Please read over the following fact sheets that you were given:   [facility] Preparing for Surgery    _x___ TAKE THE FOLLOWING MEDICATION THE MORNING OF SURGERY WITH A SMALL SIP OF WATER. These include:  1. PROTONIX (PANTOPRAZOLE)  2. TAKE AN EXTRA PROTONIX THE NIGHT BEFORE YOUR SURGERY  3.   4.  5.  6.  ____Fleets enema or Magnesium Citrate as directed.   _x___ Use CHG Soap or sage wipes as directed on instruction sheet   _X___ Use inhalers on the day of surgery and bring to hospital day of [place]  ____ Stop Metformin and Janumet 2 days prior to surgery.    ____ Take 1/2  of usual insulin dose the night before surgery and none on the morning surgery.   ____ Follow recommendations from Cardiologist, Pulmonologist or PCP regarding stopping Aspirin, Coumadin, Plavix ,Eliquis, Effient, or Pradaxa, and Pletal.  X____Stop Anti-inflammatories such as Advil, Aleve, Ibuprofen, Motrin, Naproxen, Naprosyn, Goodies powders or aspirin products  NOW-OK to take Tylenol    ____ Stop supplements until after surgery.   ____ Bring C-Pap to the hospital.

## 2019-09-27 ENCOUNTER — Other Ambulatory Visit: Payer: Medicare Other

## 2019-09-27 ENCOUNTER — Encounter
Admission: RE | Admit: 2019-09-27 | Discharge: 2019-09-27 | Disposition: A | Discharge status: Home / Self Care | Payer: Medicare Other | Source: Ambulatory Visit | Attending: Orthopedic Surgery | Admitting: Orthopedic Surgery

## 2019-09-27 ENCOUNTER — Other Ambulatory Visit: Payer: Self-pay | Admitting: Oncology

## 2019-09-27 ENCOUNTER — Telehealth: Payer: Self-pay

## 2019-09-27 ENCOUNTER — Other Ambulatory Visit: Payer: Self-pay

## 2019-09-27 DIAGNOSIS — Z01818 Encounter for other preprocedural examination: Secondary | ICD-10-CM | POA: Diagnosis not present

## 2019-09-27 DIAGNOSIS — D696 Thrombocytopenia, unspecified: Secondary | ICD-10-CM

## 2019-09-27 DIAGNOSIS — K7460 Unspecified cirrhosis of liver: Secondary | ICD-10-CM

## 2019-09-27 NOTE — Telephone Encounter (Signed)
Medical clearance request received from Dr. Rudene Christians and anesthesiologist for knee surgery on 10/02/19.  Patient not seen in office since 2019 and will need to be evaluated before Dr. Tasia Catchings can sign medical clearance. Appt will be scheduled for Monday for lab/MD.  I have also called Dr. Rudene Christians office to let them know patient will need to see Dr. Tasia Catchings first.

## 2019-09-28 LAB — SARS CORONAVIRUS 2 (TAT 6-24 HRS): SARS Coronavirus 2: NEGATIVE

## 2019-09-30 ENCOUNTER — Inpatient Hospital Stay: Payer: Medicare Other | Attending: Oncology

## 2019-09-30 ENCOUNTER — Inpatient Hospital Stay (HOSPITAL_BASED_OUTPATIENT_CLINIC_OR_DEPARTMENT_OTHER): Payer: Medicare Other | Admitting: Oncology

## 2019-09-30 ENCOUNTER — Other Ambulatory Visit: Payer: Self-pay

## 2019-09-30 ENCOUNTER — Encounter: Payer: Self-pay | Admitting: Oncology

## 2019-09-30 VITALS — BP 139/83 | HR 78 | Temp 97.2°F | Resp 18 | Wt 168.5 lb

## 2019-09-30 DIAGNOSIS — K7460 Unspecified cirrhosis of liver: Secondary | ICD-10-CM

## 2019-09-30 DIAGNOSIS — D689 Coagulation defect, unspecified: Secondary | ICD-10-CM | POA: Insufficient documentation

## 2019-09-30 DIAGNOSIS — D72819 Decreased white blood cell count, unspecified: Secondary | ICD-10-CM

## 2019-09-30 DIAGNOSIS — D696 Thrombocytopenia, unspecified: Secondary | ICD-10-CM

## 2019-09-30 DIAGNOSIS — Z90710 Acquired absence of both cervix and uterus: Secondary | ICD-10-CM | POA: Insufficient documentation

## 2019-09-30 DIAGNOSIS — Z803 Family history of malignant neoplasm of breast: Secondary | ICD-10-CM | POA: Insufficient documentation

## 2019-09-30 DIAGNOSIS — Z79899 Other long term (current) drug therapy: Secondary | ICD-10-CM | POA: Insufficient documentation

## 2019-09-30 DIAGNOSIS — F17210 Nicotine dependence, cigarettes, uncomplicated: Secondary | ICD-10-CM | POA: Insufficient documentation

## 2019-09-30 DIAGNOSIS — D61818 Other pancytopenia: Secondary | ICD-10-CM | POA: Insufficient documentation

## 2019-09-30 DIAGNOSIS — B182 Chronic viral hepatitis C: Secondary | ICD-10-CM | POA: Diagnosis not present

## 2019-09-30 DIAGNOSIS — Z01818 Encounter for other preprocedural examination: Secondary | ICD-10-CM

## 2019-09-30 DIAGNOSIS — J439 Emphysema, unspecified: Secondary | ICD-10-CM | POA: Diagnosis not present

## 2019-09-30 DIAGNOSIS — Z801 Family history of malignant neoplasm of trachea, bronchus and lung: Secondary | ICD-10-CM | POA: Insufficient documentation

## 2019-09-30 LAB — COMPREHENSIVE METABOLIC PANEL
ALT: 28 U/L (ref 0–44)
AST: 50 U/L — ABNORMAL HIGH (ref 15–41)
Albumin: 2.7 g/dL — ABNORMAL LOW (ref 3.5–5.0)
Alkaline Phosphatase: 98 U/L (ref 38–126)
Anion gap: 7 (ref 5–15)
BUN: 12 mg/dL (ref 8–23)
CO2: 24 mmol/L (ref 22–32)
Calcium: 8.5 mg/dL — ABNORMAL LOW (ref 8.9–10.3)
Chloride: 100 mmol/L (ref 98–111)
Creatinine, Ser: 0.78 mg/dL (ref 0.44–1.00)
GFR calc Af Amer: >60 mL/min (ref >60)
GFR calc non Af Amer: >60 mL/min (ref >60)
Glucose, Bld: 139 mg/dL — ABNORMAL HIGH (ref 70–99)
Potassium: 4.5 mmol/L (ref 3.5–5.1)
Sodium: 131 mmol/L — ABNORMAL LOW (ref 135–145)
Total Bilirubin: 4.6 mg/dL — ABNORMAL HIGH (ref 0.3–1.2)
Total Protein: 7.3 g/dL (ref 6.5–8.1)

## 2019-09-30 LAB — CBC WITH DIFFERENTIAL/PLATELET
Abs Immature Granulocytes: 0.01 10*3/uL (ref 0.00–0.07)
Basophils Absolute: 0 10*3/uL (ref 0.0–0.1)
Basophils Relative: 1 %
Eosinophils Absolute: 0.1 10*3/uL (ref 0.0–0.5)
Eosinophils Relative: 2 %
HCT: 38.4 % (ref 36.0–46.0)
Hemoglobin: 12.6 g/dL (ref 12.0–15.0)
Immature Granulocytes: 0 %
Lymphocytes Relative: 15 %
Lymphs Abs: 0.5 10*3/uL — ABNORMAL LOW (ref 0.7–4.0)
MCH: 34.5 pg — ABNORMAL HIGH (ref 26.0–34.0)
MCHC: 32.8 g/dL (ref 30.0–36.0)
MCV: 105.2 fL — ABNORMAL HIGH (ref 80.0–100.0)
Monocytes Absolute: 0.2 10*3/uL (ref 0.1–1.0)
Monocytes Relative: 8 %
Neutro Abs: 2.2 10*3/uL (ref 1.7–7.7)
Neutrophils Relative %: 74 %
Platelets: 46 10*3/uL — ABNORMAL LOW (ref 150–400)
RBC: 3.65 MIL/uL — ABNORMAL LOW (ref 3.87–5.11)
RDW: 14.9 % (ref 11.5–15.5)
WBC: 3 10*3/uL — ABNORMAL LOW (ref 4.0–10.5)
nRBC: 0 % (ref 0.0–0.2)

## 2019-09-30 LAB — PROTIME-INR
INR: 1.9 — ABNORMAL HIGH (ref 0.8–1.2)
Prothrombin Time: 21.5 seconds — ABNORMAL HIGH (ref 11.4–15.2)

## 2019-09-30 LAB — APTT: aPTT: 39 seconds — ABNORMAL HIGH (ref 24–36)

## 2019-09-30 NOTE — Progress Notes (Signed)
Patient here for follow up. No concerns voiced.  °

## 2019-09-30 NOTE — Pre-Procedure Instructions (Signed)
Received Medical and Hematology clearance-Hematology requesting GI clearance now. Called Sherlie Ban at dr Rudene Christians office and notified her of this. Faxed the clearance request to her office

## 2019-09-30 NOTE — Progress Notes (Signed)
Hematology/Oncology progress note [facility] [phone] [phone]   Patient Care Team: Glendon Axe, MD as PCP - General (Internal Medicine)  REFERRING PROVIDER: Leach VISIT Follow up for management of pancytopenia  HISTORY OF PRESENTING ILLNESS:  Michelle Acevedo is a  69 y.o.  female with PMH listed below who was referred to me for evaluation of pancytopenia Patient has a history of liver cirrhosis secondary to hepatitis C, following up with gastroenterology for [facility] screening. Patient has a chronic history of pancytopenia.   Reviewed patient's previous lab records in [facility], thrombocytopenia dated backing 2015, ranges from 30,000s to 40,000.  Chronic leukopenia predominantly lymphocytopenia dated back to 2015.  Hemoglobin fairly stable with baseline 12-13 Refer to hematology for evaluation of thrombocytopenia and leukopenia.  Patient had series of ultrasound done for [facility] screening.  Ultrasound showed liver has nodular border, splenomegaly 13.9 cm. Patient reports easy bruising.  Denies hematochezia, hematuria, hematemesis, epistaxis, black tarry stool. Denies weight loss, fever, chills, fatigue, night sweats.   Patient reports that she has an appointment with surgeon for evaluation of knee surgery.  # normal folate, increase B12 level, Normal LDHSmear showed unremarkable morphology ANA negative Protein electrophoresis did not show any monoclonal protein.  SPEP showed polyclonal increase in gammaglobulin due to numerous close of plasma cells producing heterogeneous antibody, likely reactive. Flow cytometry showed not able to evaluate B-cell clonality due to nonspecific light chain binding on B cells. Discussed with patient I will repeat a flow cytometry.   INTERVAL HISTORY Michelle Acevedo is a 69 y.o. female who has above history reviewed by me today presents for follow up visit for  management of pancytopenia.  #Chronic liver disease, cirrhosis.  She took herself off transplant list.  Follows up with [facility] gastroenterology clinic. #Thrombocytopenia, continues to have easy bruising.  No active bleeding. Patient has nebuscys tear and is going to have elective orthopedic surgery scheduled for arthroscopy. A preop clearance was sent to [place] and patient was recommended to make a follow-up appointment with me for evaluation.   Review of Systems  Constitutional: Positive for malaise/fatigue. Negative for chills, fever and weight loss.  HENT: Negative for nosebleeds and sore throat.   Eyes: Negative for double vision, photophobia and redness.  Respiratory: Negative for cough, shortness of breath and wheezing.   Cardiovascular: Negative for chest pain, palpitations, orthopnea and leg swelling.  Gastrointestinal: Negative for abdominal pain, blood in stool, nausea and vomiting.  Genitourinary: Negative for dysuria.  Musculoskeletal: Positive for joint pain. Negative for back pain, myalgias and neck pain.  Skin: Negative for itching and rash.  Neurological: Negative for dizziness, tingling and tremors.  Endo/Heme/Allergies: Negative for environmental allergies. Bruises/bleeds easily.  Psychiatric/Behavioral: Negative for depression and hallucinations.    MEDICAL HISTORY:  Past Medical History:  Diagnosis Date  . Anemia   . Arthritis    KNEE KLEFT  . 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 Chronic hepatitis C with cirrhosis ([place])   . Cirrhosis ([place])   . Emphysema of lung ([facility])    mild  . GERD (gastroesophageal reflux disease)   . Hepatitis C   . History of esophageal varices   . Hx of transfusion of whole blood   . Macular degeneration    LEFT EYE-RECEIVES INJECTION IN LEFT EYE FOR THIS EVERY 9 WEEKS    SURGICAL HISTORY: Past Surgical History:  Procedure Laterality Date  . ABDOMINAL HYSTERECTOMY    . BREAST CYST EXCISION  Left 1999   5 cysts excised  . CHOLECYSTECTOMY    .  EYE SURGERY Bilateral    Cataract Extraction with IOL  . OPEN REDUCTION INTERNAL FIXATION (ORIF) DISTAL RADIAL FRACTURE Left 12/08/2016   Procedure: OPEN REDUCTION INTERNAL FIXATION (ORIF) DISTAL RADIAL FRACTURE;  Surgeon: Hessie Knows, MD;  Location: [facility] ORS;  Service: Orthopedics;  Laterality: Left;    SOCIAL HISTORY: Social History   Socioeconomic History  . Marital status: Divorced    Spouse name: Not on file  . Number of children: Not on file  . Years of education: Not on file  . Highest education level: Not on file  Occupational History  . Occupation: retired  Tobacco Use  . Smoking status: Current Every Day Smoker    Packs/day: 1.50    Years: 30.00    Pack years: 45.00    Types: Cigarettes  . Smokeless tobacco: Never Used  . Tobacco comment: 1-2 CIG DAILY  Substance and Sexual Activity  . Alcohol use: Yes    Comment: occasional wine and beer  . Drug use: No  . Sexual activity: Not Currently  Other Topics Concern  . Not on file  Social History Narrative  . Not on file   Social Determinants of Health   Financial Resource Strain:   . Difficulty of Paying Living Expenses: Not on file  Food Insecurity:   . Worried About Charity fundraiser in the Last Year: Not on file  . Ran Out of Food in the Last Year: Not on file  Transportation Needs:   . Lack of Transportation (Medical): Not on file  . Lack of Transportation (Non-Medical): Not on file  Physical Activity:   . Days of Exercise per Week: Not on file  . Minutes of Exercise per Session: Not on file  Stress:   . Feeling of Stress : Not on file  Social Connections:   . Frequency of Communication with Friends and Family: Not on file  . Frequency of Social Gatherings with Friends and Family: Not on file  . Attends Religious Services: Not on file  . Active Member of Clubs or Organizations: Not on file  . Attends Archivist Meetings: Not on file  . Marital Status: Not on file  Intimate Partner Violence:    . Fear of Current or Ex-Partner: Not on file  . Emotionally Abused: Not on file  . Physically Abused: Not on file  . Sexually Abused: Not on file    FAMILY HISTORY: Family History  Problem Relation Age of Onset  . Lung cancer Mother   . Breast cancer Mother 69  . Breast cancer Sister 37  . Breast cancer Maternal Aunt   . Breast cancer Maternal Aunt     ALLERGIES:  is allergic to penicillins.  MEDICATIONS:  Current Outpatient Medications  Medication Sig Dispense Refill  . acetaminophen (TYLENOL) 500 MG tablet Take 500 mg by mouth every 6 (six) hours as needed for moderate pain or headache.    . furosemide (LASIX) 40 MG tablet Take 40 mg by mouth daily.     . Ipratropium-Albuterol (COMBIVENT) 20-100 MCG/ACT AERS respimat Inhale 2 puffs into the lungs 3 (three) times daily as needed for shortness of breath.    . lactulose (CHRONULAC) 10 GM/15ML solution Take 10 g by mouth 3 (three) times daily as needed (for elevated ammonia level).    . pantoprazole (PROTONIX) 40 MG tablet Take 40 mg by mouth every morning.     . Potassium  99 MG TABS Take 99 mg by mouth daily.    . rifaximin (XIFAXAN) 550 MG TABS tablet Take 550 mg by mouth every morning.     Marland Kitchen spironolactone (ALDACTONE) 100 MG tablet Take 100 mg by mouth daily.      No current facility-administered medications for this visit.     PHYSICAL EXAMINATION: ECOG PERFORMANCE STATUS: 1 - Symptomatic but completely ambulatory Vitals:   09/30/19 1016  BP: 139/83  Pulse: 78  Resp: 18  Temp: (!) 97.2 F (36.2 C)   Filed Weights   09/30/19 1016  Weight: 168 lb 8 oz (76.4 kg)    Physical Exam Constitutional:      General: She is not in acute distress. HENT:     Head: Normocephalic and atraumatic.  Eyes:     General: No scleral icterus.    Conjunctiva/sclera: Conjunctivae normal.     Pupils: Pupils are equal, round, and reactive to light.  Cardiovascular:     Rate and Rhythm: Normal rate and regular rhythm.     Heart  sounds: Normal heart sounds.  Pulmonary:     Effort: Pulmonary effort is normal. No respiratory distress.     Breath sounds: Normal breath sounds. No wheezing or rales.  Chest:     Chest wall: No tenderness.  Abdominal:     General: Bowel sounds are normal. There is no distension.     Palpations: Abdomen is soft. There is no mass.     Tenderness: There is no abdominal tenderness.  Musculoskeletal:        General: Swelling present. No deformity.     Cervical back: Normal range of motion and neck supple.     Comments: Left knee pain and swelling  Lymphadenopathy:     Cervical: No cervical adenopathy.  Skin:    General: Skin is warm and dry.     Findings: No erythema or rash.     Comments: Bruises on upper and lower extremities.  Neurological:     Mental Status: She is alert and oriented to person, place, and time.     Cranial Nerves: No cranial nerve deficit.     Coordination: Coordination normal.  Psychiatric:        Behavior: Behavior normal.        Thought Content: Thought content normal.      LABORATORY DATA:  I have reviewed the data as listed Lab Results  Component Value Date   WBC 3.0 (L) 09/30/2019   HGB 12.6 09/30/2019   HCT 38.4 09/30/2019   MCV 105.2 (H) 09/30/2019   PLT 46 (L) 09/30/2019   Recent Labs    09/30/19 0958  NA 131*  K 4.5  CL 100  CO2 24  GLUCOSE 139*  BUN 12  CREATININE 0.78  CALCIUM 8.5*  GFRNONAA >60  GFRAA >60  PROT 7.3  ALBUMIN 2.7*  AST 50*  ALT 28  ALKPHOS 98  BILITOT 4.6*   Iron/TIBC/Ferritin/ %Sat    Component Value Date/Time   IRON 96 04/04/2018 1159   TIBC 307 04/04/2018 1159   FERRITIN 57 04/04/2018 1159   IRONPCTSAT 31 04/04/2018 1159     RADIOGRAPHIC STUDIES: I have personally reviewed the radiological images as listed and agreed with the findings in the report. 12/14/2017  US abdomen: coarse hepatic echotexture nodular liver. Splenomegaly 13.9 cm   ASSESSMENT & PLAN:  1. Thrombocytopenia ([place])   2.  Hepatic cirrhosis, unspecified hepatic cirrhosis type, unspecified whether ascites present ([place])   3. Preoperative  clearance   4. Leukopenia, unspecified type   5. Coagulopathy ([place])    #Labs reviewed and discussed with patient. Bleeding risk is high given patient's history of cirrhosis, coagulopathy and thrombocytopenia. Thrombocytopenia, chronic, due to splenomegaly  platelet count 46,000.  Discussed with patient that I recommend transfuse 1 unit of platelet right before procedure to minimize intraoperative and postoperative bleeding.  #Coagulopathy, INR 1.9, recommend transfuse FFP prior to the procedure.  #Chronic leukopenia, ANC 2.2.  Continue to monitor. #Chronic liver disease, liver cirrhosis.  Bilirubin has significantly increased to 4.6, also her recent bilirubin level done at [facility] was 5.5.  For anesthesia, I recommend patient to have clearance from hepatology that  anesthesia medication are safe in patient with severely impaired liver function.  Orders Placed This Encounter  Procedures  . CBC with Differential/Platelet    Standing Status:   Future    Standing Expiration Date:   09/29/2020  . Comprehensive metabolic panel    Standing Status:   Future    Standing Expiration Date:   09/29/2020  . APTT    Standing Status:   Future    Standing Expiration Date:   09/29/2020  . Protime-INR    Standing Status:   Future    Standing Expiration Date:   09/29/2020    All questions were answered. The patient knows to call the clinic with any problems questions or concerns.  Return of visit: 6 months.    Earlie Server, MD, PhD Hematology Oncology [facility] at [facility] Pager- [phone] 09/30/2019

## 2019-10-02 ENCOUNTER — Encounter: Admission: RE | Payer: Self-pay | Source: Home / Self Care

## 2019-10-02 ENCOUNTER — Ambulatory Visit: Admission: RE | Admit: 2019-10-02 | Payer: Medicare Other | Source: Home / Self Care | Admitting: Orthopedic Surgery

## 2019-10-02 SURGERY — ARTHROSCOPY, KNEE
Anesthesia: Choice | Site: Knee | Laterality: Left

## 2019-10-29 ENCOUNTER — Other Ambulatory Visit: Payer: Self-pay | Admitting: Gastroenterology

## 2019-10-29 DIAGNOSIS — K7030 Alcoholic cirrhosis of liver without ascites: Secondary | ICD-10-CM

## 2019-11-01 ENCOUNTER — Ambulatory Visit: Payer: Medicare Other

## 2019-11-04 ENCOUNTER — Ambulatory Visit: Payer: Medicare Other | Attending: Internal Medicine

## 2019-11-04 DIAGNOSIS — Z20822 Contact with and (suspected) exposure to covid-19: Secondary | ICD-10-CM

## 2019-11-05 ENCOUNTER — Ambulatory Visit: Payer: Medicare Other

## 2019-11-05 LAB — NOVEL CORONAVIRUS, NAA: SARS-CoV-2, NAA: NOT DETECTED

## 2019-11-08 ENCOUNTER — Ambulatory Visit: Payer: Medicare Other | Attending: Internal Medicine

## 2019-11-08 DIAGNOSIS — Z23 Encounter for immunization: Secondary | ICD-10-CM | POA: Insufficient documentation

## 2019-11-08 NOTE — Progress Notes (Signed)
   Covid-19 Vaccination Clinic  Name:  Michelle Acevedo    MRN: [id number] DOB: 1951-04-27  11/08/2019  [name] was observed post Covid-19 immunization for 15 minutes without incidence. She was provided with Vaccine Information Sheet and instruction to access the V-Safe system.   [name] was instructed to call 911 with any severe reactions post vaccine: Marland Kitchen Difficulty breathing  . Swelling of your face and throat  . A fast heartbeat  . A bad rash all over your body  . Dizziness and weakness    Immunizations Administered    Name Date Dose VIS Date Route   Pfizer COVID-19 Vaccine 11/08/2019 11:56 AM 0.3 mL 08/23/2019 Intramuscular   Manufacturer: [place]   Lot: [id number]   [place]: [id number]

## 2019-11-12 ENCOUNTER — Ambulatory Visit: Payer: Medicare Other

## 2019-11-19 ENCOUNTER — Ambulatory Visit: Payer: Medicare Other | Attending: Gastroenterology

## 2019-12-10 ENCOUNTER — Ambulatory Visit: Payer: Medicare Other | Attending: Internal Medicine

## 2019-12-10 DIAGNOSIS — Z23 Encounter for immunization: Secondary | ICD-10-CM

## 2019-12-10 NOTE — Progress Notes (Signed)
   Covid-19 Vaccination Clinic  Name:  BRYNDA DEWATER    MRN: [id number] DOB: 01/27/1951  12/10/2019  Ms. Degler was observed post Covid-19 immunization for 15 minutes without incident. She was provided with Vaccine Information Sheet and instruction to access the V-Safe system.   Ms. Rosene was instructed to call 911 with any severe reactions post vaccine: Marland Kitchen Difficulty breathing  . Swelling of face and throat  . A fast heartbeat  . A bad rash all over body  . Dizziness and weakness   Immunizations Administered    Name Date Dose VIS Date Route   Pfizer COVID-19 Vaccine 12/10/2019 11:02 AM 0.3 mL 08/23/2019 Intramuscular   Manufacturer: [place]   Lot: [id number]   [place]: [id number]

## 2019-12-24 ENCOUNTER — Other Ambulatory Visit: Payer: Self-pay | Admitting: Internal Medicine

## 2019-12-24 DIAGNOSIS — Z1231 Encounter for screening mammogram for malignant neoplasm of breast: Secondary | ICD-10-CM

## 2019-12-26 ENCOUNTER — Ambulatory Visit
Admission: RE | Admit: 2019-12-26 | Discharge: 2019-12-26 | Disposition: A | Discharge status: Home / Self Care | Payer: Medicare Other | Source: Ambulatory Visit | Attending: Internal Medicine | Admitting: Internal Medicine

## 2019-12-26 DIAGNOSIS — Z1231 Encounter for screening mammogram for malignant neoplasm of breast: Secondary | ICD-10-CM | POA: Insufficient documentation

## 2020-01-07 ENCOUNTER — Other Ambulatory Visit: Payer: Self-pay

## 2020-01-07 ENCOUNTER — Encounter: Payer: Self-pay | Admitting: Physical Therapy

## 2020-01-07 ENCOUNTER — Ambulatory Visit: Payer: Medicare Other | Attending: Internal Medicine | Admitting: Physical Therapy

## 2020-01-07 DIAGNOSIS — R2689 Other abnormalities of gait and mobility: Secondary | ICD-10-CM | POA: Insufficient documentation

## 2020-01-07 DIAGNOSIS — M6281 Muscle weakness (generalized): Secondary | ICD-10-CM | POA: Diagnosis not present

## 2020-01-07 NOTE — Therapy (Signed)
[place] MAIN [facility] SERVICES [address] [place], [place], [address] Phone: [phone]   Fax:  [phone]  Physical Therapy Evaluation  Patient Details  Name: Michelle Acevedo MRN: [id number] Date of Birth: 11/14/1950 Referring Provider (PT): Teena Irani   Encounter Date: 01/07/2020  PT End of Session - 01/07/20 1008    Visit Number  1    Number of Visits  17    Date for PT Re-Evaluation  03/03/20    PT Start Time  1000    PT Stop Time  1100    PT Time Calculation (min)  60 min    Equipment Utilized During Treatment  Gait belt    Activity Tolerance  Patient tolerated treatment well       Past Medical History:  Diagnosis Date  . Anemia   . Arthritis    KNEE KLEFT  . 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 Chronic hepatitis C with cirrhosis ([place])   . Cirrhosis ([place])   . Emphysema of lung ([facility])    mild  . GERD (gastroesophageal reflux disease)   . Hepatitis C   . History of esophageal varices   . Hx of transfusion of whole blood   . Macular degeneration    LEFT EYE-RECEIVES INJECTION IN LEFT EYE FOR THIS EVERY 9 WEEKS    Past Surgical History:  Procedure Laterality Date  . ABDOMINAL HYSTERECTOMY    . BREAST CYST EXCISION Left 1999   5 cysts excised  . CHOLECYSTECTOMY    . EYE SURGERY Bilateral    Cataract Extraction with IOL  . OPEN REDUCTION INTERNAL FIXATION (ORIF) DISTAL RADIAL FRACTURE Left 12/08/2016   Procedure: OPEN REDUCTION INTERNAL FIXATION (ORIF) DISTAL RADIAL FRACTURE;  Surgeon: Hessie Knows, MD;  Location: [facility] ORS;  Service: Orthopedics;  Laterality: Left;    There were no vitals filed for this visit.   Subjective Assessment - 01/07/20 1011    Subjective  Patient reports that she is needing therapy to get stronger for her liver transplant surgery.    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 She has had complications  of her liver disease including encephalopathy, ascites and history of variceal hemorrhage. Up until this time, the patient has undergone treatments for these complications including diuretic therapy. The patient has not had a prior liver transplant. Surgical history is notable for an open cholcystectomy via R subcostal incision and hysterectomy via Pfannensteil incision. She is capable of ADLs without significant limitation. Mobility is somewhat limited due to knee pain/arthritis.    Limitations  Lifting;Standing;Walking;House hold activities    How long can you stand comfortably?  unlimited    How long can you walk comfortably?  15 mins    Currently in Pain?  Yes    Pain Score  1     Pain Location  Knee    Pain Orientation  Left    Pain Descriptors / Indicators  Aching    Pain Type  Chronic pain    Aggravating Factors   walking    Pain Relieving Factors  staying off of it.    Effect of Pain on Daily Activities  difficult to do exercises    Multiple Pain Sites  No         [facility] PT Assessment - 01/07/20 1016      Assessment   Medical Diagnosis  liver disease  Referring Provider (PT)  Teena Irani    Onset Date/Surgical Date  --   25 years ago   Hand Dominance  Right    Prior Therapy  no      Precautions   Precautions  None      Restrictions   Weight Bearing Restrictions  No      Balance Screen   Has the patient fallen in the past 6 months  No    Has the patient had a decrease in activity level because of a fear of falling?   Yes    Is the patient reluctant to leave their home because of a fear of falling?   No      Home Environment   Living Environment  Private residence    Living Arrangements  Children    Available Help at Discharge  Family    Type of [place] to enter    Entrance Stairs-Number of Steps  4    Entrance Stairs-Rails  Right    [place]  One level    [place] - single point      Prior Function   Level of  [place]  Retired    Leisure  elipticle      Cognition   Overall Cognitive Status  Within Functional Limits for tasks assessed         PAIN: 1/10 Left knee   POSTURE :  WNL  PROM/AROM: WFL BUE and BLE  STRENGTH:  Graded on a 0-5 scale Muscle Group Left Right                          Hip Flex 4/5 4/5  Hip Abd 4/5 4/5  Hip Add -3/5 -3/5  Hip Ext -4/5 -4/5      Knee Flex 4/5 4/5  Knee Ext 4/5 4/5  Ankle DF 4/5 4/5  Ankle PF 4/5 4/5   SENSATION: WNL   FUNCTIONAL MOBILITY: independent with all bed mobility and independent with transfers sit to stand   BALANCE: Standing Dynamic Balance  Normal Stand independently unsupported, able to weight shift and cross midline maximally   Good Stand independently unsupported, able to weight shift and cross midline moderately x  Good-/Fair+ Stand independently unsupported, able to weight shift across midline minimally   Fair Stand independently unsupported, weight shift, and reach ipsilaterally, loss of balance when crossing midline   Poor+ Able to stand with Min A and reach ipsilaterally, unable to weight shift   Poor Able to stand with Mod A and minimally reach ipsilaterally, unable to cross midline.    Static Standing Balance  Normal Able to maintain standing balance against maximal resistance x  Good Able to maintain standing balance against moderate resistance   Good-/Fair+ Able to maintain standing balance against minimal resistance   Fair Able to stand unsupported without UE support and without LOB for 1-2 min   Fair- Requires Min A and UE support to maintain standing without loss of balance   Poor+ Requires mod A and UE support to maintain standing without loss of balance   Poor Requires max A and UE support to maintain standing balance without loss       GAIT: Pateint has mild  path deviations with head turns ; ambulates without AD with decreased gait speed   OUTCOME MEASURES: TEST  Outcome Interpretation  5 times sit<>stand  8.65sec >89 yo, >15 sec indicates increased risk for falls  10 meter walk test    .90             m/s <1.0 m/s indicates increased risk for falls; limited community ambulator      6 minute walk test  1250              Feet 1000 feet is community ambulator             Treatment:  SLR x 15 x 2 BLE Hip abd x 15 x 2  Hip add x 15 x 2  hooklying marching x 15 x 2  Bridging x 15 x 2 .  Prone hip extension x 15 x 2  Patient performed with instruction, verbal cues, tactile cues of therapist: goal: increase tissue extensibility, promote proper posture, improve mobility      Objective measurements completed on examination: See above findings.              PT Education - 01/07/20 1007    Education Details  plan of care    Person(s) Educated  Patient    Methods  Explanation    Comprehension  Verbalized understanding       PT Short Term Goals - 01/07/20 1130      PT SHORT TERM GOAL #1   Title  Patient will be independent in home exercise program to improve strength/mobility for better functional independence with ADLs.    Time  4    Period  Weeks    Status  New    Target Date  02/04/20        PT Long Term Goals - 01/07/20 1131      PT LONG TERM GOAL #1   Title  Patient will increase six minute walk test distance to >1200 for progression to community ambulator and improve gait ability    Time  8    Period  Weeks    Status  New    Target Date  03/03/20      PT LONG TERM GOAL #2   Title  Patient will increase BLE gross strength to 4+/5 as to improve functional strength for independent gait, increased standing tolerance and increased ADL ability    Time  8    Period  Weeks    Status  New    Target Date  03/03/20      PT LONG TERM GOAL #3   Title  Patient will be able to perform household work/ chores without increase in symptoms.    Time  8    Period  Weeks    Status  New    Target Date  03/03/20      PT LONG  TERM GOAL #4   Title  Patient will increase lower extremity functional scale to >60/80 to demonstrate improved functional mobility and increased tolerance with ADLs.    Time  8    Period  Weeks    Status  New    Target Date  03/03/20           Patient presents with decreased gait speed, decreased balance, and decreased BLE strength. Patient's main complaint is BLE weakness and inability to participate in desired activities. Patient wants to improve strength and  balance and ability to ambulate on inclines and outdoor surfaces safely. Patient will benefit from skilled PT in order to increase gait speed, increase BLE strength, and improve dynamic standing balance, and enable patient to participate in  desired activities.  Plan - 01/07/20 1051    Clinical Impression Statement  Patient presents with decreased gait speed, decreased balance, and decreased BLE strength. Patient's main complaint is BLE weakness and inability to participate in desired activities. Patient wants to improve strength and  balance and ability to ambulate on inclines and outdoor surfaces safely. Patient will benefit from skilled PT in order to increase gait speed, increase BLE strength, and improve dynamic standing balance, and enable patient to participate in desired activities.    Personal Factors and Comorbidities  Age;Comorbidity 1;Comorbidity 2    Comorbidities  meniscus tear    Examination-Activity Limitations  Stairs;Locomotion Level;Lift;Carry;Squat    Stability/Clinical Decision Making  Stable/Uncomplicated    Clinical Decision Making  Low    Rehab Potential  Fair    PT Frequency  2x / week    PT Duration  8 weeks    PT Treatment/Interventions  Gait training;Therapeutic activities;Functional mobility training;Therapeutic exercise;Balance training;Patient/family education;Manual techniques    PT Next Visit Plan  strengthening BLE    PT Home Exercise Plan  supine, sidelying and prone LE exercises    Consulted and  Agree with Plan of Care  Patient       Patient will benefit from skilled therapeutic intervention in order to improve the following deficits and impairments:  Abnormal gait, Decreased activity tolerance, Decreased strength, Pain, Obesity, Impaired flexibility, Decreased balance, Decreased mobility, Difficulty walking  Visit Diagnosis: Muscle weakness (generalized)  Other abnormalities of gait and mobility     Problem List Patient Active Problem List   Diagnosis Date Noted  . Personal history of tobacco use, presenting hazards to health 11/15/2016    Alanson Puls, PT DPT 01/07/2020, 11:36 AM  [place] MAIN [facility] SERVICES [place], [place], [address] Phone: [phone]   Fax:  [phone]  Name: Michelle Acevedo MRN: [id number] Date of Birth: 07/21/1951

## 2020-01-07 NOTE — Patient Instructions (Signed)
Bridge    Lying on back, legs bent 90, feet flat on floor. Press up hips and torso, reaching hands to feet. Hold for _5___ breaths.   Copyright  VHI. All rights reserved.  HIP: Abduction - Side-Lying    Lie on side, legs straight and in line with trunk. Squeeze glutes. Raise top leg up and slightly back. Point toes forward. __15_ reps per set, __2_ sets per day, __7_ days per week Bend bottom leg to stabilize pelvis.  Copyright  VHI. All rights reserved.  Hip Extension (Prone)    Lift left leg __10__ inches from floor, keeping knee locked. Repeat __2__ times per set. Do 2____ sets per session. Do _7___ sessions per day.  http://orth.exer.us/99   Copyright  VHI. All rights reserved.  Hip Abduction: Side-Lying (Single Leg)  Hip Adduction: Side-Lying (Single Leg)   HIP: Adduction - Side-Lying    Lie on side with top leg crossed in front of bottom leg. Raise bottom leg, keep knee straight. _15__ reps per set, __2_ sets per day, __7_ days per week   Copyright  VHI. All rights reserved.    http://tub.exer.us/206   Copyright  VHI. All rights reserved.   week.  http://tub.exer.us/210   Copyright  VHI. All rights reserved.

## 2020-01-13 ENCOUNTER — Other Ambulatory Visit: Payer: Self-pay

## 2020-01-13 ENCOUNTER — Ambulatory Visit: Payer: Medicare Other | Attending: Internal Medicine | Admitting: Physical Therapy

## 2020-01-13 ENCOUNTER — Encounter: Payer: Self-pay | Admitting: Physical Therapy

## 2020-01-13 DIAGNOSIS — M6281 Muscle weakness (generalized): Secondary | ICD-10-CM | POA: Insufficient documentation

## 2020-01-13 DIAGNOSIS — R2689 Other abnormalities of gait and mobility: Secondary | ICD-10-CM | POA: Diagnosis present

## 2020-01-13 NOTE — Therapy (Signed)
[place] MAIN [facility] SERVICES [address] [place], [place], [address] Phone: [phone]   Fax:  [phone]  Physical Therapy Treatment  Patient Details  Name: Michelle Acevedo MRN: [id number] Date of Birth: 10/14/1950 Referring Provider (PT): Teena Irani   Encounter Date: 01/13/2020  PT End of Session - 01/13/20 1019    Visit Number  2    Number of Visits  17    Date for PT Re-Evaluation  03/03/20    PT Start Time  [id number]    PT Stop Time  1055    PT Time Calculation (min)  40 min    Equipment Utilized During Treatment  Gait belt    Activity Tolerance  Patient tolerated treatment well    Behavior During Therapy  [facility] for tasks assessed/performed       Past Medical History:  Diagnosis Date  . Anemia   . Arthritis    KNEE KLEFT  . 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 Chronic hepatitis C with cirrhosis ([place])   . Cirrhosis ([place])   . Emphysema of lung (HCC)    mild  . GERD (gastroesophageal reflux disease)   . Hepatitis C   . History of esophageal varices   . Hx of transfusion of whole blood   . Macular degeneration    LEFT EYE-RECEIVES INJECTION IN LEFT EYE FOR THIS EVERY 9 WEEKS    Past Surgical History:  Procedure Laterality Date  . ABDOMINAL HYSTERECTOMY    . BREAST CYST EXCISION Left 1999   5 cysts excised  . CHOLECYSTECTOMY    . EYE SURGERY Bilateral    Cataract Extraction with IOL  . OPEN REDUCTION INTERNAL FIXATION (ORIF) DISTAL RADIAL FRACTURE Left 12/08/2016   Procedure: OPEN REDUCTION INTERNAL FIXATION (ORIF) DISTAL RADIAL FRACTURE;  Surgeon: Hessie Knows, MD;  Location: [facility] ORS;  Service: Orthopedics;  Laterality: Left;    There were no vitals filed for this visit.  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 She has  had complications of her liver disease including encephalopathy, ascites and history of variceal hemorrhage. Up until this time, the patient has undergone treatments for these complications including diuretic therapy. The patient has not had a prior liver transplant. Surgical history is notable for an open cholcystectomy via R subcostal incision and hysterectomy via Pfannensteil incision. She is capable of ADLs without significant limitation. Mobility is somewhat limited due to knee pain/arthritis.    Limitations  Lifting;Standing;Walking;House hold activities    How long can you stand comfortably?  unlimited    How long can you walk comfortably?  15 mins    Currently in Pain?  Yes    Pain Score  4     Pain Location  Knee    Pain Orientation  Left    Pain Descriptors / Indicators  Aching    Pain Type  Chronic pain    Pain Frequency  Intermittent    Aggravating Factors   walking    Pain Relieving Factors  rest    Effect of Pain on Daily Activities  hard to do activities    Multiple Pain Sites  No       Ther-ex  Octane fitness x 5 mins  Hip flexion marches with 2#  ankle weights x 10 bilateral; Hip abduction with 2# x 10 bilateral Hip extension with 2# x 10 bilateral Stepping over hurdle with 2 # fwd/ bwd, side to side x 20  Quantum leg press 25# x 20 x 3 sets  Lunges to BOSU ball x 15 BLE Heel raises  With 2 lbs x 15 x 2 sets Step ups to 6-inch stool from purple foam x 20  Eccentric step downs from 6 inch stool to purple foam x 10 verbal cues to complete slow and tap heel.  BUE CGA  Supine: SLR x 15 BLE Hookling marching x 15  Hooklying abd/ER x 15  Bridging, LLE elevated 6 inches x 10 Bridging, RLE elevated 6 inches x 10 Bridges , 3 sec hold x 15  Sidelying: Hip abd x 15 , BLE  Prone: Knee ext and hip ext x 15 BLE   Patient needs occasional verbal cueing to improve posture and cueing to correctly perform exercises slowly, holding at end of range to increase motor firing of  desired muscle to encourage fatigue.                          PT Education - 01/13/20 1018    Education Details  HEP    Person(s) Educated  Patient    Methods  Explanation    Comprehension  Verbalized understanding;Returned demonstration       PT Short Term Goals - 01/07/20 1130      PT SHORT TERM GOAL #1   Title  Patient will be independent in home exercise program to improve strength/mobility for better functional independence with ADLs.    Time  4    Period  Weeks    Status  New    Target Date  02/04/20        PT Long Term Goals - 01/07/20 1131      PT LONG TERM GOAL #1   Title  Patient will increase six minute walk test distance to >1200 for progression to community ambulator and improve gait ability    Time  8    Period  Weeks    Status  New    Target Date  03/03/20      PT LONG TERM GOAL #2   Title  Patient will increase BLE gross strength to 4+/5 as to improve functional strength for independent gait, increased standing tolerance and increased ADL ability    Time  8    Period  Weeks    Status  New    Target Date  03/03/20      PT LONG TERM GOAL #3   Title  Patient will be able to perform household work/ chores without increase in symptoms.    Time  8    Period  Weeks    Status  New    Target Date  03/03/20      PT LONG TERM GOAL #4   Title  Patient will increase lower extremity functional scale to >60/80 to demonstrate improved functional mobility and increased tolerance with ADLs.    Time  8    Period  Weeks    Status  New    Target Date  03/03/20            Plan - 01/13/20 1019    Clinical Impression Statement   Pt was able to perform all exercises today with CGA.Marland Kitchen Pt was able to perform all  strength exercises, demonstrating improvements in LE  strength and stability.  .  Pt requires verbal, visual and tactile cues during exercise in order to complete tasks with proper form and technique.  Pt would continue to benefit from  skilled PT services in order to further strengthen LE's, in order to increase functional mobility and decrease risk of falls   Personal Factors and Comorbidities  Age;Comorbidity 1;Comorbidity 2    Comorbidities  meniscus tear    Examination-Activity Limitations  Stairs;Locomotion Level;Lift;Carry;Squat    Stability/Clinical Decision Making  Stable/Uncomplicated    Rehab Potential  Fair    PT Frequency  2x / week    PT Duration  8 weeks    PT Treatment/Interventions  Gait training;Therapeutic activities;Functional mobility training;Therapeutic exercise;Balance training;Patient/family education;Manual techniques    PT Next Visit Plan  strengthening BLE    PT Home Exercise Plan  supine, sidelying and prone LE exercises    Consulted and Agree with Plan of Care  Patient       Patient will benefit from skilled therapeutic intervention in order to improve the following deficits and impairments:  Abnormal gait, Decreased activity tolerance, Decreased strength, Pain, Obesity, Impaired flexibility, Decreased balance, Decreased mobility, Difficulty walking  Visit Diagnosis: Muscle weakness (generalized)  Other abnormalities of gait and mobility     Problem List Patient Active Problem List   Diagnosis Date Noted  . Personal history of tobacco use, presenting hazards to health 11/15/2016    Alanson Puls, PT DPT 01/13/2020, 10:20 AM  [place] MAIN [facility] SERVICES [place], [place], [address] Phone: [phone]   Fax:  [phone]  Name: SHEBRA DALOISIO MRN: [id number] Date of Birth: [birth year]

## 2020-01-15 ENCOUNTER — Ambulatory Visit: Payer: Medicare Other | Admitting: Physical Therapy

## 2020-01-20 ENCOUNTER — Other Ambulatory Visit: Payer: Self-pay

## 2020-01-20 ENCOUNTER — Encounter: Payer: Self-pay | Admitting: Physical Therapy

## 2020-01-20 ENCOUNTER — Ambulatory Visit: Payer: Medicare Other | Admitting: Physical Therapy

## 2020-01-20 DIAGNOSIS — R2689 Other abnormalities of gait and mobility: Secondary | ICD-10-CM

## 2020-01-20 DIAGNOSIS — M6281 Muscle weakness (generalized): Secondary | ICD-10-CM | POA: Diagnosis not present

## 2020-01-20 NOTE — Therapy (Signed)
[place] MAIN [facility] SERVICES [address] [place], [place], [address] Phone: [phone]   Fax:  [phone]  Physical Therapy Treatment  Patient Details  Name: Michelle Acevedo MRN: [id number] Date of Birth: January 06, 1951 Referring Provider (PT): Teena Irani   Encounter Date: 01/20/2020  PT End of Session - 01/20/20 1057    Visit Number  3    Number of Visits  17    Date for PT Re-Evaluation  03/03/20    PT Start Time  1100    PT Stop Time  1140    PT Time Calculation (min)  40 min    Equipment Utilized During Treatment  Gait belt    Activity Tolerance  Patient tolerated treatment well    Behavior During Therapy  [facility] for tasks assessed/performed       Past Medical History:  Diagnosis Date  . Anemia   . Arthritis    KNEE KLEFT  . 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 Chronic hepatitis C with cirrhosis ([place])   . Cirrhosis ([place])   . Emphysema of lung (HCC)    mild  . GERD (gastroesophageal reflux disease)   . Hepatitis C   . History of esophageal varices   . Hx of transfusion of whole blood   . Macular degeneration    LEFT EYE-RECEIVES INJECTION IN LEFT EYE FOR THIS EVERY 9 WEEKS    Past Surgical History:  Procedure Laterality Date  . ABDOMINAL HYSTERECTOMY    . BREAST CYST EXCISION Left 1999   5 cysts excised  . CHOLECYSTECTOMY    . EYE SURGERY Bilateral    Cataract Extraction with IOL  . OPEN REDUCTION INTERNAL FIXATION (ORIF) DISTAL RADIAL FRACTURE Left 12/08/2016   Procedure: OPEN REDUCTION INTERNAL FIXATION (ORIF) DISTAL RADIAL FRACTURE;  Surgeon: Hessie Knows, MD;  Location: [facility] ORS;  Service: Orthopedics;  Laterality: Left;    There were no vitals filed for this visit.  Subjective Assessment - 01/20/20 1057    Subjective  Patient reports that she is having left knee pain 4/10. She was sore after her last treatment.    Pertinent History  Patient is waiting for a liver transplant surgery.The patient has end-stage liver disease  as a result of Hep C, now treated. She has had complications of her liver disease including encephalopathy, ascites and history of variceal hemorrhage. Up until this time, the patient has undergone treatments for these complications including diuretic therapy. The patient has not had a prior liver transplant. Surgical history is notable for an open cholcystectomy via R subcostal incision and hysterectomy via Pfannensteil incision. She is capable of ADLs without significant limitation. Mobility is somewhat limited due to knee pain/arthritis.    Limitations  Lifting;Standing;Walking;House hold activities    How long can you stand comfortably?  unlimited    How long can you walk comfortably?  15 mins      Treatment: Ther-ex  Octane fitness x 5 mins  Hip flexion marches with 2# ankle weights x 10 bilateral; Hip abduction with 2# x 10 bilateral Hip extension with 2# x 10 bilateral Hurdle fwd/ bwd , side to side x 15  Lunges to BOSU ball x 15 BLE Heel raises x 15 x 2 sets Step ups to 6-inch stool x 20  Eccentric step downs from 3 inch stool x 10 verbal cues to complete slow and tap heel.  BUE CGA Leg press x 20 x 3 , 40 lbs:  heel raises x 20  x3 , 40 LBS Patient needs occasional verbal cueing to improve posture and cueing to correctly perform exercises slowly, holding at end of range to increase motor firing of desired muscle to encourage fatigue.                          PT Education - 01/20/20 1057    Education Details  HEP    Person(s) Educated  Patient    Methods  Explanation    Comprehension  Verbalized understanding;Returned demonstration       PT Short Term Goals - 01/07/20 1130      PT SHORT TERM GOAL #1   Title  Patient will be independent in home exercise program to improve strength/mobility for better functional independence with ADLs.    Time  4    Period  Weeks    Status  New    Target Date  02/04/20        PT Long Term Goals - 01/07/20 1131       PT LONG TERM GOAL #1   Title  Patient will increase six minute walk test distance to >1200 for progression to community ambulator and improve gait ability    Time  8    Period  Weeks    Status  New    Target Date  03/03/20      PT LONG TERM GOAL #2   Title  Patient will increase BLE gross strength to 4+/5 as to improve functional strength for independent gait, increased standing tolerance and increased ADL ability    Time  8    Period  Weeks    Status  New    Target Date  03/03/20      PT LONG TERM GOAL #3   Title  Patient will be able to perform household work/ chores without increase in symptoms.    Time  8    Period  Weeks    Status  New    Target Date  03/03/20      PT LONG TERM GOAL #4   Title  Patient will increase lower extremity functional scale to >60/80 to demonstrate improved functional mobility and increased tolerance with ADLs.    Time  8    Period  Weeks    Status  New    Target Date  03/03/20            Plan - 01/20/20 1057    Clinical Impression Statement   Pt was able to perform all exercises today with CGA.Marland Kitchen Pt was able to perform all balance and strength exercises, demonstrating improvements in LE strength and stability.   Pt requires verbal, visual and tactile cues during exercise in order to complete tasks with proper form and technique,  Pt would continue to benefit from skilled PT services in order to further strengthen LE's in order to increase functional mobility and decrease risk of falls   Personal Factors and Comorbidities  Age;Comorbidity 1;Comorbidity 2    Comorbidities  meniscus tear    Examination-Activity Limitations  Stairs;Locomotion Level;Lift;Carry;Squat    Stability/Clinical Decision Making  Stable/Uncomplicated    Rehab Potential  Fair    PT Frequency  2x / week    PT Duration  8 weeks    PT Treatment/Interventions  Gait training;Therapeutic activities;Functional mobility training;Therapeutic exercise;Balance  training;Patient/family education;Manual techniques    PT Next Visit Plan  strengthening BLE    PT Home Exercise Plan  supine, sidelying and prone LE  exercises    Consulted and Agree with Plan of Care  Patient       Patient will benefit from skilled therapeutic intervention in order to improve the following deficits and impairments:  Abnormal gait, Decreased activity tolerance, Decreased strength, Pain, Obesity, Impaired flexibility, Decreased balance, Decreased mobility, Difficulty walking  Visit Diagnosis: Other abnormalities of gait and mobility  Muscle weakness (generalized)     Problem List Patient Active Problem List   Diagnosis Date Noted  . Personal history of tobacco use, presenting hazards to health 11/15/2016    Alanson Puls, PT DPT 01/20/2020, 11:04 AM  [place] MAIN [facility] SERVICES [address] [place], [place], [address] Phone: [phone]   Fax:  [phone]  Name: CHAMPAGNE FAUDREE MRN: [id number] Date of Birth: [birth year]

## 2020-01-22 ENCOUNTER — Encounter: Payer: Self-pay | Admitting: Physical Therapy

## 2020-01-27 ENCOUNTER — Other Ambulatory Visit: Payer: Self-pay

## 2020-01-27 ENCOUNTER — Encounter: Payer: Self-pay | Admitting: Physical Therapy

## 2020-01-27 ENCOUNTER — Ambulatory Visit: Payer: Medicare Other | Admitting: Physical Therapy

## 2020-01-27 DIAGNOSIS — M6281 Muscle weakness (generalized): Secondary | ICD-10-CM | POA: Diagnosis not present

## 2020-01-27 DIAGNOSIS — R2689 Other abnormalities of gait and mobility: Secondary | ICD-10-CM

## 2020-01-27 NOTE — Therapy (Signed)
[place] MAIN [facility] SERVICES [address] [place], [place], [address] Phone: [phone]   Fax:  [phone]  Physical Therapy Treatment  Patient Details  Name: Michelle Acevedo MRN: [id number] Date of Birth: [birth year] Referring Provider (PT): Teena Irani   Encounter Date: 01/27/2020  PT End of Session - 01/27/20 1020    Visit Number  4    Number of Visits  17    Date for PT Re-Evaluation  03/03/20    PT Start Time  [id number]    PT Stop Time  1055    PT Time Calculation (min)  40 min    Equipment Utilized During Treatment  Gait belt    Activity Tolerance  Patient tolerated treatment well    Behavior During Therapy  [facility] for tasks assessed/performed       Past Medical History:  Diagnosis Date  . Anemia   . Arthritis    KNEE KLEFT  . 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 Chronic hepatitis C with cirrhosis ([place])   . Cirrhosis ([place])   . Emphysema of lung (HCC)    mild  . GERD (gastroesophageal reflux disease)   . Hepatitis C   . History of esophageal varices   . Hx of transfusion of whole blood   . Macular degeneration    LEFT EYE-RECEIVES INJECTION IN LEFT EYE FOR THIS EVERY 9 WEEKS    Past Surgical History:  Procedure Laterality Date  . ABDOMINAL HYSTERECTOMY    . BREAST CYST EXCISION Left 1999   5 cysts excised  . CHOLECYSTECTOMY    . EYE SURGERY Bilateral    Cataract Extraction with IOL  . OPEN REDUCTION INTERNAL FIXATION (ORIF) DISTAL RADIAL FRACTURE Left 12/08/2016   Procedure: OPEN REDUCTION INTERNAL FIXATION (ORIF) DISTAL RADIAL FRACTURE;  Surgeon: Hessie Knows, MD;  Location: [facility] ORS;  Service: Orthopedics;  Laterality: Left;    There were no vitals filed for this visit.  Subjective Assessment - 01/27/20 1019    Subjective  Patient reports that she is having left knee pain 4/10. She was sore after her last treatment.    Pertinent History  Patient is waiting for a liver transplant surgery.The patient has end-stage liver disease  as a result of Hep C, now treated. She has had complications of her liver disease including encephalopathy, ascites and history of variceal hemorrhage. Up until this time, the patient has undergone treatments for these complications including diuretic therapy. The patient has not had a prior liver transplant. Surgical history is notable for an open cholcystectomy via R subcostal incision and hysterectomy via Pfannensteil incision. She is capable of ADLs without significant limitation. Mobility is somewhat limited due to knee pain/arthritis.    Limitations  Lifting;Standing;Walking;House hold activities    How long can you stand comfortably?  unlimited    How long can you walk comfortably?  15 mins    Currently in Pain?  Yes    Pain Score  4     Pain Location  Knee    Pain Orientation  Left         Ther-ex  Octane fitness L 6   x 5 mins  Quadriped AI UE/LE x 10 with 5 sec hold Tall kneeling with YTB and chops x 20 left to right , and right to left  Tall kneeling with YTB and trunk rotation left and right Bridges with 2 sec hold, unable to do single leg bridges due to weakness SLR intervals x 2.5  Lbs  with hold and 15 reps Hip flexion marches with 2# ankle weights x 20 bilateral; Hip abduction with  2.5# x 20 bilateral Hip extension with  2.5# x 20 bilateral Quantum leg press 25# x 20 x 3 sets  Sit to stand without UE support from regular height chair x 10   Squats x 15 with cues for correct posture Lunges to BOSU ball x 15 BLE Heel raises x 15 x 2 sets Step ups to 6-inch stool x 20  Eccentric step downs from 3 inch stool x 10 verbal cues to complete slow and tap heel.  BUE CGA Patient needs occasional verbal cueing to improve posture and cueing to correctly perform exercises slowly, holding at end of range to increase motor firing of desired muscle to encourage fatigue.                         PT Education - 01/27/20 1019    Education Details  HEP    Person(s)  Educated  Patient    Methods  Explanation    Comprehension  Verbalized understanding;Need further instruction;Returned demonstration       PT Short Term Goals - 01/07/20 1130      PT SHORT TERM GOAL #1   Title  Patient will be independent in home exercise program to improve strength/mobility for better functional independence with ADLs.    Time  4    Period  Weeks    Status  New    Target Date  02/04/20        PT Long Term Goals - 01/07/20 1131      PT LONG TERM GOAL #1   Title  Patient will increase six minute walk test distance to >1200 for progression to community ambulator and improve gait ability    Time  8    Period  Weeks    Status  New    Target Date  03/03/20      PT LONG TERM GOAL #2   Title  Patient will increase BLE gross strength to 4+/5 as to improve functional strength for independent gait, increased standing tolerance and increased ADL ability    Time  8    Period  Weeks    Status  New    Target Date  03/03/20      PT LONG TERM GOAL #3   Title  Patient will be able to perform household work/ chores without increase in symptoms.    Time  8    Period  Weeks    Status  New    Target Date  03/03/20      PT LONG TERM GOAL #4   Title  Patient will increase lower extremity functional scale to >60/80 to demonstrate improved functional mobility and increased tolerance with ADLs.    Time  8    Period  Weeks    Status  New    Target Date  03/03/20            Plan - 01/27/20 1020    Clinical Impression Statement   Pt was able to perform all exercises today with CGA.Marland Kitchen Pt was able to perform  strength exercises, demonstrating improvements in LE strength and stability..  Pt requires verbal, visual and tactile cues during exercise in order to complete tasks with proper form and technique.  Pt would continue to benefit from skilled PT services in order to further strengthen LE's,  in order to increase functional  mobility and decrease risk of falls   Personal  Factors and Comorbidities  Age;Comorbidity 1;Comorbidity 2    Examination-Activity Limitations  Stairs;Locomotion Level;Lift;Carry;Squat    Rehab Potential  Fair    PT Frequency  2x / week    PT Duration  8 weeks    PT Treatment/Interventions  Gait training;Therapeutic activities;Functional mobility training;Therapeutic exercise;Balance training;Patient/family education;Manual techniques       Patient will benefit from skilled therapeutic intervention in order to improve the following deficits and impairments:  Abnormal gait, Decreased activity tolerance, Decreased strength, Pain, Obesity, Impaired flexibility, Decreased balance, Decreased mobility, Difficulty walking  Visit Diagnosis: Other abnormalities of gait and mobility  Muscle weakness (generalized)     Problem List Patient Active Problem List   Diagnosis Date Noted  . Personal history of tobacco use, presenting hazards to health 11/15/2016    Alanson Puls, PT DPT 01/27/2020, 10:21 AM  [place] MAIN [facility] SERVICES [address] [place], [place], [address] Phone: [phone]   Fax:  [phone]  Name: Michelle Acevedo MRN: [id number] Date of Birth: 1951/03/26

## 2020-01-29 ENCOUNTER — Ambulatory Visit: Payer: Medicare Other | Admitting: Physical Therapy

## 2020-02-03 ENCOUNTER — Other Ambulatory Visit: Payer: Self-pay

## 2020-02-03 ENCOUNTER — Encounter: Payer: Self-pay | Admitting: Physical Therapy

## 2020-02-03 ENCOUNTER — Ambulatory Visit: Payer: Medicare Other | Admitting: Physical Therapy

## 2020-02-03 DIAGNOSIS — M6281 Muscle weakness (generalized): Secondary | ICD-10-CM

## 2020-02-03 DIAGNOSIS — R2689 Other abnormalities of gait and mobility: Secondary | ICD-10-CM

## 2020-02-03 NOTE — Therapy (Signed)
[place] MAIN [facility] SERVICES [address] [place], [place], [address] Phone: [phone]   Fax:  [phone]  Physical Therapy Treatment  Patient Details  Name: Michelle Acevedo MRN: [id number] Date of Birth: Feb 04, 1951 Referring Provider (PT): Teena Irani   Encounter Date: 02/03/2020  PT End of Session - 02/03/20 0957    Visit Number  5    Number of Visits  17    Date for PT Re-Evaluation  03/03/20    PT Start Time  0945    PT Stop Time  1023    PT Time Calculation (min)  38 min    Equipment Utilized During Treatment  Gait belt    Activity Tolerance  Patient tolerated treatment well    Behavior During Therapy  [facility] for tasks assessed/performed       Past Medical History:  Diagnosis Date  . Anemia   . Arthritis    KNEE KLEFT  . 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 Chronic hepatitis C with cirrhosis ([place])   . Cirrhosis ([place])   . Emphysema of lung ([facility])    mild  . GERD (gastroesophageal reflux disease)   . Hepatitis C   . History of esophageal varices   . Hx of transfusion of whole blood   . Macular degeneration    LEFT EYE-RECEIVES INJECTION IN LEFT EYE FOR THIS EVERY 9 WEEKS    Past Surgical History:  Procedure Laterality Date  . ABDOMINAL HYSTERECTOMY    . BREAST CYST EXCISION Left 1999   5 cysts excised  . CHOLECYSTECTOMY    . EYE SURGERY Bilateral    Cataract Extraction with IOL  . OPEN REDUCTION INTERNAL FIXATION (ORIF) DISTAL RADIAL FRACTURE Left 12/08/2016   Procedure: OPEN REDUCTION INTERNAL FIXATION (ORIF) DISTAL RADIAL FRACTURE;  Surgeon: Hessie Knows, MD;  Location: [facility] ORS;  Service: Orthopedics;  Laterality: Left;    There were no vitals filed for this visit.  Subjective Assessment - 02/03/20 0952    Subjective  Patient reports that she is having left back pain 8/10. 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 She has had complications of her liver disease including encephalopathy, ascites and history of variceal hemorrhage. Up until this time, the patient has undergone treatments for these complications including diuretic therapy. The patient has not had a prior liver transplant. Surgical history is notable for an open cholcystectomy via R subcostal incision and hysterectomy via Pfannensteil incision. She is capable of ADLs without significant limitation. Mobility is somewhat limited due to knee pain/arthritis.    Limitations  Lifting;Standing;Walking;House hold activities    How long can you stand comfortably?  unlimited    How long can you walk comfortably?  15 mins    Currently in Pain?  Yes    Pain Score  8     Pain Location  Back    Pain Orientation  Left    Pain Descriptors / Indicators  Aching    Pain Type  Acute pain    Aggravating Factors   walking, standing    Pain Relieving Factors  sitting    Effect of Pain on Daily Activities  hard to do activities        Treatment: Nu-step x 5 mins L 2 UE and LE Heating pad to low back during exercises: hooklying marching  x 20 x 2  hooklying hip abd/ER x 20 x 2 BTB Heel slides flex/ext x 20, BLE Supine hip abd/add x 20 , BLE sidelying hip abd x 15  Patient performed with instruction, verbal cues, tactile cues of therapist: goal: increase tissue extensibility, promote proper posture, improve mobility                        PT Education - 02/03/20 0956    Education Details  HEP    Person(s) Educated  Patient    Methods  Explanation;Demonstration    Comprehension  Verbalized understanding;Need further instruction       PT Short Term Goals - 01/07/20 1130      PT SHORT TERM GOAL #1   Title  Patient will be independent in home exercise program to improve strength/mobility for better functional independence with ADLs.    Time  4    Period  Weeks    Status  New    Target Date   02/04/20        PT Long Term Goals - 01/07/20 1131      PT LONG TERM GOAL #1   Title  Patient will increase six minute walk test distance to >1200 for progression to community ambulator and improve gait ability    Time  8    Period  Weeks    Status  New    Target Date  03/03/20      PT LONG TERM GOAL #2   Title  Patient will increase BLE gross strength to 4+/5 as to improve functional strength for independent gait, increased standing tolerance and increased ADL ability    Time  8    Period  Weeks    Status  New    Target Date  03/03/20      PT LONG TERM GOAL #3   Title  Patient will be able to perform household work/ chores without increase in symptoms.    Time  8    Period  Weeks    Status  New    Target Date  03/03/20      PT LONG TERM GOAL #4   Title  Patient will increase lower extremity functional scale to >60/80 to demonstrate improved functional mobility and increased tolerance with ADLs.    Time  8    Period  Weeks    Status  New    Target Date  03/03/20            Plan - 02/03/20 1000    Clinical Impression Statement   Pt was able to perform all exercises today with CGA.Marland Kitchen Pt has back pain today and performed BLE strength exercises, demonstrating improvements in LE strength and stability.    Pt requires verbal, visual and tactile cues during exercise in order to complete tasks with proper form and technique. Pt would continue to benefit from skilled PT services in order to further strengthen LE's, improve static and dynamic balance,in order to increase functional mobility and decrease risk of falls   Personal Factors and Comorbidities  Age;Comorbidity 1;Comorbidity 2    Examination-Activity Limitations  Stairs;Locomotion Level;Lift;Carry;Squat    Rehab Potential  Fair    PT Frequency  2x / week    PT Duration  8 weeks    PT Treatment/Interventions  Gait training;Therapeutic activities;Functional mobility training;Therapeutic exercise;Balance  training;Patient/family education;Manual techniques       Patient will benefit from skilled therapeutic intervention in order to improve the  following deficits and impairments:  Abnormal gait, Decreased activity tolerance, Decreased strength, Pain, Obesity, Impaired flexibility, Decreased balance, Decreased mobility, Difficulty walking  Visit Diagnosis: Other abnormalities of gait and mobility  Muscle weakness (generalized)     Problem List Patient Active Problem List   Diagnosis Date Noted  . Personal history of tobacco use, presenting hazards to health 11/15/2016    Alanson Puls, PT DPT 02/03/2020, 10:20 AM  [place] MAIN [facility] SERVICES [place], [place], [address] Phone: [phone]   Fax:  [phone]  Name: KATHLEAN PAONE MRN: [id number] Date of Birth: [birth year]

## 2020-02-05 ENCOUNTER — Ambulatory Visit: Payer: Medicare Other | Admitting: Physical Therapy

## 2020-02-12 ENCOUNTER — Ambulatory Visit: Payer: Medicare Other | Admitting: Physical Therapy

## 2020-02-12 ENCOUNTER — Encounter: Payer: Medicare Other | Admitting: Physical Therapy

## 2020-02-17 ENCOUNTER — Ambulatory Visit: Payer: Medicare Other | Admitting: Physical Therapy

## 2020-02-19 ENCOUNTER — Ambulatory Visit: Payer: Medicare Other | Admitting: Physical Therapy

## 2020-02-24 ENCOUNTER — Ambulatory Visit: Payer: Medicare Other | Admitting: Physical Therapy

## 2020-02-26 ENCOUNTER — Ambulatory Visit: Payer: Medicare Other | Admitting: Physical Therapy

## 2020-03-02 ENCOUNTER — Ambulatory Visit: Payer: Medicare Other | Admitting: Physical Therapy

## 2020-03-04 ENCOUNTER — Ambulatory Visit: Payer: Medicare Other | Admitting: Physical Therapy

## 2020-03-09 ENCOUNTER — Ambulatory Visit: Payer: Medicare Other | Admitting: Physical Therapy

## 2020-03-11 ENCOUNTER — Ambulatory Visit: Payer: Medicare Other | Admitting: Physical Therapy

## 2020-03-17 ENCOUNTER — Ambulatory Visit: Payer: Medicare Other | Admitting: Physical Therapy

## 2020-03-19 ENCOUNTER — Ambulatory Visit: Payer: Medicare Other | Admitting: Physical Therapy

## 2020-03-24 ENCOUNTER — Ambulatory Visit: Payer: Medicare Other | Admitting: Physical Therapy

## 2020-03-26 ENCOUNTER — Ambulatory Visit: Payer: Medicare Other | Admitting: Physical Therapy

## 2020-03-31 ENCOUNTER — Ambulatory Visit: Payer: Medicare Other | Admitting: Oncology

## 2020-03-31 ENCOUNTER — Ambulatory Visit: Payer: Medicare Other | Admitting: Physical Therapy

## 2020-03-31 ENCOUNTER — Other Ambulatory Visit: Payer: Medicare Other

## 2020-04-02 ENCOUNTER — Ambulatory Visit: Payer: Medicare Other | Admitting: Physical Therapy

## 2020-04-07 ENCOUNTER — Ambulatory Visit: Payer: Medicare Other | Admitting: Physical Therapy

## 2020-04-09 ENCOUNTER — Ambulatory Visit: Payer: Medicare Other | Admitting: Physical Therapy

## 2020-04-14 ENCOUNTER — Ambulatory Visit: Payer: Medicare Other | Admitting: Physical Therapy

## 2020-04-16 ENCOUNTER — Ambulatory Visit: Payer: Medicare Other | Admitting: Physical Therapy

## 2020-04-21 ENCOUNTER — Ambulatory Visit: Payer: Medicare Other | Admitting: Physical Therapy

## 2020-04-23 ENCOUNTER — Ambulatory Visit: Payer: Medicare Other | Admitting: Physical Therapy

## 2020-08-12 ENCOUNTER — Encounter: Payer: Self-pay | Admitting: *Deleted

## 2020-08-12 ENCOUNTER — Emergency Department: Payer: Medicare Other

## 2020-08-12 ENCOUNTER — Other Ambulatory Visit: Payer: Self-pay

## 2020-08-12 ENCOUNTER — Emergency Department
Admission: EM | Admit: 2020-08-12 | Discharge: 2020-08-13 | Disposition: A | Discharge status: Home / Self Care | Payer: Medicare Other | Attending: Emergency Medicine | Admitting: Emergency Medicine

## 2020-08-12 DIAGNOSIS — Z8544 Personal history of malignant neoplasm of other female genital organs: Secondary | ICD-10-CM | POA: Insufficient documentation

## 2020-08-12 DIAGNOSIS — R221 Localized swelling, mass and lump, neck: Secondary | ICD-10-CM | POA: Insufficient documentation

## 2020-08-12 DIAGNOSIS — K1120 Sialoadenitis, unspecified: Secondary | ICD-10-CM

## 2020-08-12 DIAGNOSIS — F17210 Nicotine dependence, cigarettes, uncomplicated: Secondary | ICD-10-CM | POA: Insufficient documentation

## 2020-08-12 DIAGNOSIS — Z20822 Contact with and (suspected) exposure to covid-19: Secondary | ICD-10-CM | POA: Diagnosis not present

## 2020-08-12 LAB — CBC WITH DIFFERENTIAL/PLATELET
Abs Immature Granulocytes: 0.01 10*3/uL (ref 0.00–0.07)
Basophils Absolute: 0 10*3/uL (ref 0.0–0.1)
Basophils Relative: 1 %
Eosinophils Absolute: 0.1 10*3/uL (ref 0.0–0.5)
Eosinophils Relative: 4 %
HCT: 34.2 % — ABNORMAL LOW (ref 36.0–46.0)
Hemoglobin: 11.3 g/dL — ABNORMAL LOW (ref 12.0–15.0)
Immature Granulocytes: 0 %
Lymphocytes Relative: 20 %
Lymphs Abs: 0.6 10*3/uL — ABNORMAL LOW (ref 0.7–4.0)
MCH: 28.8 pg (ref 26.0–34.0)
MCHC: 33 g/dL (ref 30.0–36.0)
MCV: 87.2 fL (ref 80.0–100.0)
Monocytes Absolute: 0.3 10*3/uL (ref 0.1–1.0)
Monocytes Relative: 10 %
Neutro Abs: 1.9 10*3/uL (ref 1.7–7.7)
Neutrophils Relative %: 65 %
Platelets: 73 10*3/uL — ABNORMAL LOW (ref 150–400)
RBC: 3.92 MIL/uL (ref 3.87–5.11)
RDW: 13.3 % (ref 11.5–15.5)
WBC: 2.9 10*3/uL — ABNORMAL LOW (ref 4.0–10.5)
nRBC: 0 % (ref 0.0–0.2)

## 2020-08-12 LAB — COMPREHENSIVE METABOLIC PANEL
ALT: 18 U/L (ref 0–44)
AST: 24 U/L (ref 15–41)
Albumin: 3.8 g/dL (ref 3.5–5.0)
Alkaline Phosphatase: 110 U/L (ref 38–126)
Anion gap: 8 (ref 5–15)
BUN: 20 mg/dL (ref 8–23)
CO2: 25 mmol/L (ref 22–32)
Calcium: 8.8 mg/dL — ABNORMAL LOW (ref 8.9–10.3)
Chloride: 105 mmol/L (ref 98–111)
Creatinine, Ser: 0.9 mg/dL (ref 0.44–1.00)
GFR, Estimated: >60 mL/min (ref >60)
Glucose, Bld: 181 mg/dL — ABNORMAL HIGH (ref 70–99)
Potassium: 4.2 mmol/L (ref 3.5–5.1)
Sodium: 138 mmol/L (ref 135–145)
Total Bilirubin: 0.8 mg/dL (ref 0.3–1.2)
Total Protein: 6.7 g/dL (ref 6.5–8.1)

## 2020-08-12 MED ORDER — IOHEXOL 300 MG/ML  SOLN
75.0000 mL | Freq: Once | INTRAMUSCULAR | Status: AC | PRN
  Administered 2020-08-12: 75 mL via INTRAVENOUS

## 2020-08-12 NOTE — ED Provider Notes (Signed)
[facility] Emergency Department Provider Note   ____________________________________________   First MD Initiated Contact with Patient 08/12/20 2156     (approximate)  I have reviewed the triage vital signs and the nursing notes.   HISTORY  Chief Complaint neck swelling    HPI Michelle Acevedo is a 69 y.o. female the history of liver transplant, hepatitis C  Patient reports this evening a couple hours prior to arrival she noticed that her front of her neck seems somewhat swollen.  She felt like there a couple of lumps or nodules in her neck, and she was not sure why.  She took 2 Benadryl tablets and feels like the swelling has slightly improved.  No difficulty swallowing no difficulty breathing.  Feels a slight sense of swelling around her throat but it seems to be mostly in the front of her neck  She is on immunosuppressants, she called her transplant coordinator who recommended she come for evaluation  She has never had symptoms like this before.  No fever no chills.  Compliant with her medications.  She does not have any dental pain.  No unusual discharge or foul odor or taste in her mouth.  No difficulty breathing.  No cough.  No chest pain.   Past Medical History:  Diagnosis Date  . Anemia   . Arthritis    KNEE KLEFT  . 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 Chronic hepatitis C with cirrhosis ([place])   . Cirrhosis ([place])   . Emphysema of lung ([facility])    mild  . GERD (gastroesophageal reflux disease)   . Hepatitis C   . History of esophageal varices   . Hx of transfusion of whole blood   . Macular degeneration    LEFT EYE-RECEIVES INJECTION IN LEFT EYE FOR THIS EVERY 9 WEEKS    Patient Active Problem List   Diagnosis Date Noted  . Personal history of tobacco use, presenting hazards to health 11/15/2016    Past Surgical History:  Procedure Laterality Date  . ABDOMINAL HYSTERECTOMY    . BREAST CYST EXCISION Left 1999   5 cysts excised  .  CHOLECYSTECTOMY    . EYE SURGERY Bilateral    Cataract Extraction with IOL  . OPEN REDUCTION INTERNAL FIXATION (ORIF) DISTAL RADIAL FRACTURE Left 12/08/2016   Procedure: OPEN REDUCTION INTERNAL FIXATION (ORIF) DISTAL RADIAL FRACTURE;  Surgeon: Hessie Knows, MD;  Location: [facility] ORS;  Service: Orthopedics;  Laterality: Left;    Prior to Admission medications   Medication Sig Start Date End Date Taking? Authorizing Provider  acetaminophen (TYLENOL) 500 MG tablet Take 500 mg by mouth every 6 (six) hours as needed for moderate pain or headache.    [provider]  furosemide (LASIX) 40 MG tablet Take 40 mg by mouth daily.  11/26/14   [provider]  Ipratropium-Albuterol (COMBIVENT) 20-100 MCG/ACT AERS respimat Inhale 2 puffs into the lungs 3 (three) times daily as needed for shortness of breath. 11/20/18 11/20/19  [provider]  lactulose (CHRONULAC) 10 GM/15ML solution Take 10 g by mouth 3 (three) times daily as needed (for elevated ammonia level).    [provider]  pantoprazole (PROTONIX) 40 MG tablet Take 40 mg by mouth every morning.  11/26/14   [provider]  Potassium 99 MG TABS Take 99 mg by mouth daily.    [provider]  rifaximin (XIFAXAN) 550 MG TABS tablet Take 550 mg by mouth every morning.     [provider]  spironolactone (ALDACTONE) 100 MG tablet Take 100 mg by mouth daily.     [provider]    Allergies Penicillins  Family History  Problem Relation Age of Onset  . Lung cancer Mother   . Breast cancer Mother 55  . Breast cancer Sister 62  . Breast cancer Maternal Aunt   . Breast cancer Maternal Aunt     Social History Social History   Tobacco Use  . Smoking status: Current Every Day Smoker    Packs/day: 1.50    Years: 30.00    Pack years: 45.00    Types: Cigarettes  . Smokeless tobacco: Never Used  . Tobacco comment: 1-2 CIG DAILY  Substance Use Topics  . Alcohol use: Yes    Comment:  occasional wine and beer  . Drug use: No    Review of Systems Constitutional: No fever/chills Eyes: No visual changes. ENT: No sore throat.  Reports swelling to the neck slight discomfort when turning neck left and right across the front where it is swollen Cardiovascular: Denies chest pain. Respiratory: Denies shortness of breath. Gastrointestinal: No abdominal pain.  Somewhat recent liver transplant Genitourinary: Negative for dysuria. Musculoskeletal: Negative for back pain. Skin: Negative for rash. Neurological: Negative for headaches, areas of focal weakness or numbness.  Patient denies any rash or itching.  ____________________________________________   PHYSICAL EXAM:  VITAL SIGNS: ED Triage Vitals  Enc Vitals Group     BP 08/12/20 2125 138/61     Pulse Rate 08/12/20 2125 77     Resp 08/12/20 2125 (!) 23     Temp 08/12/20 2125 98.4 F (36.9 C)     Temp Source 08/12/20 2125 Oral     SpO2 08/12/20 2125 97 %     Weight 08/12/20 2126 168 lb 6.9 oz (76.4 kg)     Height 08/12/20 2126 5\' 2"  (1.575 m)     Head Circumference --      Peak Flow --      Pain Score 08/12/20 2125 4     Pain Loc --      Pain Edu? --      Excl. in [place]? --     Constitutional: Alert and oriented. Well appearing and in no acute distress. Eyes: Conjunctivae are normal. Head: Atraumatic. Nose: No congestion/rhinnorhea. Mouth/Throat: Mucous membranes are moist.  Posterior oropharynx is widely patent Neck: No stridor.  As she does not have any difficulty swallowing reports is slightly uncomfortable in the front of neck when she swallows.  No trouble breathing.  She has notable soft slightly tender swelling on both portions of the anterior neck, there is no overlying erythema or warmth.  The area seem somewhat soft to touch, almost cystic or nodular in nature.  Query if this could represent submandibular glandular swelling or lymphatic versus other etiologies. Cardiovascular: Normal rate, regular  rhythm. Grossly normal heart sounds.  Good peripheral circulation. Respiratory: Normal respiratory effort.  No retractions. Lungs CTAB. Gastrointestinal: Soft and nontender. No distention. Musculoskeletal: No lower extremity tenderness nor edema. Neurologic:  Normal speech and language. No gross focal neurologic deficits are appreciated.  Skin:  Skin is warm, dry and intact. No rash noted. Psychiatric: Mood and affect are normal. Speech and behavior are normal.  ____________________________________________   LABS (all labs ordered are listed, but only abnormal results are displayed)  Labs Reviewed  CBC WITH DIFFERENTIAL/PLATELET - Abnormal; Notable for the following components:      Result Value   WBC 2.9 (*)  Hemoglobin 11.3 (*)    HCT 34.2 (*)    Platelets 73 (*)    Lymphs Abs 0.6 (*)    All other components within normal limits  COMPREHENSIVE METABOLIC PANEL - Abnormal; Notable for the following components:   Glucose, Bld 181 (*)    Calcium 8.8 (*)    All other components within normal limits   ____________________________________________  EKG   ____________________________________________  RADIOLOGY  No results found.  CT study results pending at time of signout ____________________________________________   PROCEDURES  Procedure(s) performed: None  Procedures  Critical Care performed: No  ____________________________________________   INITIAL IMPRESSION / ASSESSMENT AND PLAN / ED COURSE  Pertinent labs & imaging results that were available during my care of the patient were reviewed by me and considered in my medical decision making (see chart for details).   Patient presents for evaluation of rather rapid onset of swelling of the anterior neck.  She is have a history of immunosuppression and liver transplant.  She has mild leukopenia.  She has a history of immunosuppression.  She denies fever, does not have any obvious infectious symptoms but I do have  concern that she may be at high risk for viral or other etiologies of glandular or infectious or other etiologies.  Potential causes such as viral etiologies or bacterial infection considered also potential lymphatic etiologies etc.  We will proceed with CT scan.  Patient understanding of this and agreeable with plan.  She does not wish to be treated with steroids at the advice of her transplant coordinator, we did discuss risks and benefits of this and she is quite strong in her desire not to be treated with steroids unless absolutely necessary-and at this point I am not quite sure of the etiology  She is maintaining her airway well without stridor or evidence of distress.  She is handling secretions without difficulty and swallowing without difficulty ----------------------------------------- 11:08 PM on 08/12/2020 -----------------------------------------  Ongoing care and disposition assigned to Dr. Karma Greaser to follow-up on CT imaging results.  We did discuss the patient's history of immunosuppression and liver transplant as this may be important in consideration of clinical etiology and treatment.      ____________________________________________   FINAL CLINICAL IMPRESSION(S) / ED DIAGNOSES  Final diagnoses:  Neck swelling        Note:  This document was prepared using Dragon voice recognition software and may include unintentional dictation errors       Delman Kitten, MD 08/12/20 2310

## 2020-08-12 NOTE — ED Triage Notes (Signed)
Pt to ED reporting left sided neck swelling and pain starting at 1730 this evening. Pt reports pain in her throat. No changes in voice. No fevers. Pt took benadryl at home with no change. No rash and no know food allergies.   Pt reports she had an eye injection yesterday but this is not a new medication.

## 2020-08-12 NOTE — ED Notes (Signed)
Pt to ED for c/o neck swelling. Pt states that she was cooking at 1800 and noticed her neck was swollen. Pt states that she has not had anything new in environment or new foods. Pt denies taking any ACE inhibitors. Pt states that she did have a CT scan yesterday with contrast, states that she has had previous scans prior without incident.

## 2020-08-12 NOTE — ED Provider Notes (Addendum)
-----------------------------------------   11:01 PM on 08/12/2020 -----------------------------------------  Assuming care from Dr. Jacqualine Code.  In short, ANJENETTE GERBINO is a 69 y.o. female with a chief complaint of neck swelling.  Refer to the original H&P for additional details.  The current plan of care is to follow up CT scan and reassess.    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  Reviewing her labs we find a leukopenia and thrombocytopenia although the platelet count is actually a little bit higher than it has been in the past.  I reassessed the patient and she reports feeling some difficulty swallowing but she is able to tolerate her secretions and breathing without difficulty.  She has tenderness to palpation and some pain with any rotation of her head and neck side to side and flexion extension but no meningeal signs and she is generally stable and well-appearing other than the obvious anterior neck swelling.  I called and spoke by phone with the [facility] transfer center and they will put me in contact with the doctor covering for the transplant service so we can discuss the case and determine if she would benefit from emergent transfer to [facility] versus follow-up as an outpatient.   ----------------------------------------- 1:17 AM on 08/13/2020 -----------------------------------------  I spoke by phone with Dr. Devin Going with the [facility] liver transplant team.  We discussed the case in detail and she felt that as long as the patient was in no distress and did not appear ill/toxic, she can go home and follow-up as an outpatient.  I think that is very appropriate based on the clinical picture.  When I went back into the room to reassess, she is lying in a supine position, not having any respiratory distress nor difficulty swallowing.  Her voice  is clear and she is speaking without difficulty in spite of the anterior neck swelling.  Dr. Angela Adam said it would be okay if she got a dose of Decadron 10 mg IV which I ordered.  I passed along Dr. Corinne Ports recommendations that the patient take no more than 2 g of Tylenol per day and that she not take any anti-inflammatories (NSAIDs).  The patient understands and agrees with the plan and will call her transplant coordinator later this morning to schedule a close follow-up appointment.  We did not specifically discuss antibiotics, but given that the patient has bilateral multifocal disease process rather than a single focus of infection, I think it is much more likely that this is a viral process rather than acute bacterial infection.  Given that she has such close follow-up at the [facility] transplant center I think it is appropriate to hold off on empiric treatment and let them guide the therapy for fear of causing additional unexpected consequences of unnecessary antibiotic therapy.   I gave my usual and customary return precautions.    Hinda Kehr, MD 08/13/20 2706    Hinda Kehr, MD 08/13/20 [phone]

## 2020-08-13 DIAGNOSIS — R221 Localized swelling, mass and lump, neck: Secondary | ICD-10-CM | POA: Diagnosis not present

## 2020-08-13 LAB — RESP PANEL BY RT-PCR (FLU A&B, COVID) ARPGX2
Influenza A by PCR: NEGATIVE
Influenza B by PCR: NEGATIVE
SARS Coronavirus 2 by RT PCR: NEGATIVE

## 2020-08-13 MED ORDER — DEXAMETHASONE SODIUM PHOSPHATE 10 MG/ML IJ SOLN
10.0000 mg | Freq: Once | INTRAMUSCULAR | Status: AC
  Administered 2020-08-13: 10 mg via INTRAVENOUS
  Filled 2020-08-13: qty 1

## 2020-08-13 NOTE — Discharge Instructions (Signed)
As we discussed, your work-up was generally reassuring.  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  We spoke with the [facility] transplant doctor on-call and since you are relatively comfortable and not in distress, she agrees it is appropriate to discharge you home for close follow-up at the transplant clinic.  Please call your transplant coordinator later this morning to schedule the next available follow-up appointment.  If at any point you develop new or worsening symptoms, such as difficulty swallowing, difficulty breathing, worsening pain, fever, etc., please return to the nearest emergency department.

## 2020-11-11 ENCOUNTER — Other Ambulatory Visit: Payer: Self-pay | Admitting: Internal Medicine

## 2020-11-11 DIAGNOSIS — Z1231 Encounter for screening mammogram for malignant neoplasm of breast: Secondary | ICD-10-CM

## 2020-12-09 ENCOUNTER — Telehealth: Payer: Self-pay | Admitting: *Deleted

## 2020-12-09 NOTE — Telephone Encounter (Signed)
Patient scheduled for lung screening CT $/27/2022 11:00 AM, no change in insurance, quit smoking a year ago

## 2020-12-10 ENCOUNTER — Other Ambulatory Visit: Payer: Self-pay | Admitting: *Deleted

## 2020-12-10 DIAGNOSIS — Z87891 Personal history of nicotine dependence: Secondary | ICD-10-CM

## 2020-12-10 DIAGNOSIS — Z122 Encounter for screening for malignant neoplasm of respiratory organs: Secondary | ICD-10-CM

## 2020-12-10 NOTE — Progress Notes (Signed)
Contacted and scheduled for annual lung screening scan. Patient is a former smoker, quit 12/10/19, 45 pack year history.

## 2020-12-28 ENCOUNTER — Other Ambulatory Visit: Payer: Self-pay

## 2020-12-28 ENCOUNTER — Ambulatory Visit
Admission: RE | Admit: 2020-12-28 | Discharge: 2020-12-28 | Disposition: A | Discharge status: Home / Self Care | Payer: Medicare Other | Source: Ambulatory Visit | Attending: Internal Medicine | Admitting: Internal Medicine

## 2020-12-28 DIAGNOSIS — Z1231 Encounter for screening mammogram for malignant neoplasm of breast: Secondary | ICD-10-CM | POA: Insufficient documentation

## 2021-01-06 ENCOUNTER — Ambulatory Visit
Admission: RE | Admit: 2021-01-06 | Discharge: 2021-01-06 | Disposition: A | Discharge status: Home / Self Care | Payer: Medicare Other | Source: Ambulatory Visit | Attending: Oncology | Admitting: Oncology

## 2021-01-06 ENCOUNTER — Other Ambulatory Visit: Payer: Self-pay

## 2021-01-06 DIAGNOSIS — Z87891 Personal history of nicotine dependence: Secondary | ICD-10-CM | POA: Diagnosis not present

## 2021-01-06 DIAGNOSIS — Z122 Encounter for screening for malignant neoplasm of respiratory organs: Secondary | ICD-10-CM | POA: Insufficient documentation

## 2021-01-18 ENCOUNTER — Encounter: Payer: Self-pay | Admitting: *Deleted

## 2021-11-16 ENCOUNTER — Other Ambulatory Visit: Payer: Self-pay | Admitting: Internal Medicine

## 2021-11-16 DIAGNOSIS — Z1231 Encounter for screening mammogram for malignant neoplasm of breast: Secondary | ICD-10-CM

## 2021-12-08 ENCOUNTER — Telehealth: Payer: Medicare Other | Admitting: Physician Assistant

## 2021-12-08 DIAGNOSIS — B9689 Other specified bacterial agents as the cause of diseases classified elsewhere: Secondary | ICD-10-CM | POA: Diagnosis not present

## 2021-12-08 DIAGNOSIS — J208 Acute bronchitis due to other specified organisms: Secondary | ICD-10-CM | POA: Diagnosis not present

## 2021-12-08 MED ORDER — ALBUTEROL SULFATE HFA 108 (90 BASE) MCG/ACT IN AERS: 1.0000 | INHALATION_SPRAY | Freq: Four times a day (QID) | RESPIRATORY_TRACT | 0 refills | Status: AC | PRN

## 2021-12-08 MED ORDER — AZITHROMYCIN 250 MG PO TABS: ORAL_TABLET | ORAL | 1 refills | Status: AC

## 2021-12-08 MED ORDER — AZITHROMYCIN 250 MG PO TABS: ORAL_TABLET | ORAL | 0 refills | Status: DC

## 2021-12-08 NOTE — Patient Instructions (Signed)
?[name], thank you for joining Mar Daring, PA-C for today's virtual visit.  While this provider is not your primary care provider (PCP), if your PCP is located in our provider database this encounter information will be shared with them immediately following your visit. ? ?Consent: ?(Patient) Michelle Acevedo provided verbal consent for this virtual visit at the beginning of the encounter. ? ?Current Medications: ? ?Current Outpatient Medications:  ?  albuterol (VENTOLIN HFA) 108 (90 Base) MCG/ACT inhaler, Inhale 1-2 puffs into the lungs every 6 (six) hours as needed for wheezing or shortness of breath., Disp: 18 g, Rfl: 0 ?  acetaminophen (TYLENOL) 500 MG tablet, Take 500 mg by mouth every 6 (six) hours as needed for moderate pain or headache., Disp: , Rfl:  ?  azithromycin (ZITHROMAX) 250 MG tablet, Take 2 tablets on day 1, then 1 tablet daily on days 2 through 5, Disp: 6 tablet, Rfl: 1 ?  furosemide (LASIX) 40 MG tablet, Take 40 mg by mouth daily. , Disp: , Rfl:  ?  Ipratropium-Albuterol (COMBIVENT) 20-100 MCG/ACT AERS respimat, Inhale 2 puffs into the lungs 3 (three) times daily as needed for shortness of breath., Disp: , Rfl:  ?  lactulose (CHRONULAC) 10 GM/15ML solution, Take 10 g by mouth 3 (three) times daily as needed (for elevated ammonia level)., Disp: , Rfl:  ?  pantoprazole (PROTONIX) 40 MG tablet, Take 40 mg by mouth every morning. , Disp: , Rfl:  ?  Potassium 99 MG TABS, Take 99 mg by mouth daily., Disp: , Rfl:  ?  rifaximin (XIFAXAN) 550 MG TABS tablet, Take 550 mg by mouth every morning. , Disp: , Rfl:  ?  spironolactone (ALDACTONE) 100 MG tablet, Take 100 mg by mouth daily. , Disp: , Rfl:   ? ?Medications ordered in this encounter:  ?Meds ordered this encounter  ?Medications  ? DISCONTD: azithromycin (ZITHROMAX) 250 MG tablet  ?  Sig: Take 2 tablets on day 1, then 1 tablet daily on days 2 through 5  ?  Dispense:  6 tablet  ?  Refill:  0  ?  Order Specific Question:   Supervising  Provider  ?  Answer:   Noemi Chapel [[id number]]  ? azithromycin (ZITHROMAX) 250 MG tablet  ?  Sig: Take 2 tablets on day 1, then 1 tablet daily on days 2 through 5  ?  Dispense:  6 tablet  ?  Refill:  1  ?  Order Specific Question:   Supervising Provider  ?  Answer:   Noemi Chapel [[id number]]  ? albuterol (VENTOLIN HFA) 108 (90 Base) MCG/ACT inhaler  ?  Sig: Inhale 1-2 puffs into the lungs every 6 (six) hours as needed for wheezing or shortness of breath.  ?  Dispense:  18 g  ?  Refill:  0  ?  Order Specific Question:   Supervising Provider  ?  Answer:   Noemi Chapel [[id number]]  ?  ? ?*If you need refills on other medications prior to your next appointment, please contact your pharmacy* ? ?Follow-Up: ?Call back or seek an in-person evaluation if the symptoms worsen or if the condition fails to improve as anticipated. ? ?Other Instructions ?Acute Bronchitis, Adult ?Acute bronchitis is sudden inflammation of the main airways (bronchi) that come off the windpipe (trachea) in the lungs. The swelling causes the airways to get smaller and make more mucus than normal. This can make it hard to breathe and can cause coughing or noisy breathing (wheezing). ?Acute bronchitis  may last several weeks. The cough may last longer. Allergies, asthma, and exposure to smoke may make the condition worse. ?What are the causes? ?This condition can be caused by germs and by substances that irritate the lungs, including: ?Cold and flu viruses. The most common cause of this condition is the virus that causes the common cold. ?Bacteria. This is less common. ?Breathing in substances that irritate the lungs, including: ?Smoke from cigarettes and other forms of tobacco. ?Dust and pollen. ?Fumes from household cleaning products, gases, or burned fuel. ?Indoor or outdoor air pollution. ?What increases the risk? ?The following factors may make you more likely to develop this condition: ?A weak body's defense system, also called the immune system. ?A condition  that affects your lungs and breathing, such as asthma. ?What are the signs or symptoms? ?Common symptoms of this condition include: ?Coughing. This may bring up clear, yellow, or green mucus from your lungs (sputum). ?Wheezing. ?Runny or stuffy nose. ?Having too much mucus in your lungs (chest congestion). ?Shortness of breath. ?Aches and pains, including sore throat or chest. ?How is this diagnosed? ?This condition is usually diagnosed based on: ?Your symptoms and medical history. ?A physical exam. ?You may also have other tests, including tests to rule out other conditions, such as pneumonia. These tests include: ?A test of lung function. ?Test of a mucus sample to look for the presence of bacteria. ?Tests to check the oxygen level in your blood. ?Blood tests. ?Chest X-ray. ?How is this treated? ?Most cases of acute bronchitis clear up over time without treatment. Your health care provider may recommend: ?Drinking more fluids to help thin your mucus so it is easier to cough up. ?Taking inhaled medicine (inhaler) to improve air flow in and out of your lungs. ?Using a vaporizer or a humidifier. These are machines that add water to the air to help you breathe better. ?Taking a medicine that thins mucus and clears congestion (expectorant). ?Taking a medicine that prevents or stops coughing (cough suppressant). ?It is notcommon to take an antibiotic medicine for this condition. ?Follow these instructions at home: ? ?Take over-the-counter and prescription medicines only as told by your health care provider. ?Use an inhaler, vaporizer, or humidifier as told by your health care provider. ?Take two teaspoons (10 mL) of honey at bedtime to lessen coughing at night. ?Drink enough fluid to keep your urine pale yellow. ?Do not use any products that contain nicotine or tobacco. These products include cigarettes, chewing tobacco, and vaping devices, such as e-cigarettes. If you need help quitting, ask your health care  provider. ?Get plenty of rest. ?Return to your normal activities as told by your health care provider. Ask your health care provider what activities are safe for you. ?Keep all follow-up visits. This is important. ?How is this prevented? ?To lower your risk of getting this condition again: ?Wash your hands often with soap and water for at least 20 seconds. If soap and water are not available, use hand sanitizer. ?Avoid contact with people who have cold symptoms. ?Try not to touch your mouth, nose, or eyes with your hands. ?Avoid breathing in smoke or chemical fumes. Breathing smoke or chemical fumes will make your condition worse. ?Get the flu shot every year. ?Contact a health care provider if: ?Your symptoms do not improve after 2 weeks. ?You have trouble coughing up the mucus. ?Your cough keeps you awake at night. ?You have a fever. ?Get help right away if you: ?Cough up blood. ?Feel pain in your  chest. ?Have severe shortness of breath. ?Faint or keep feeling like you are going to faint. ?Have a severe headache. ?Have a fever or chills that get worse. ?These symptoms may represent a serious problem that is an emergency. Do not wait to see if the symptoms will go away. Get medical help right away. Call your local emergency services (911 in the [place] Do not drive yourself to the hospital. ?Summary ?Acute bronchitis is inflammation of the main airways (bronchi) that come off the windpipe (trachea) in the lungs. The swelling causes the airways to get smaller and make more mucus than normal. ?Drinking more fluids can help thin your mucus so it is easier to cough up. ?Take over-the-counter and prescription medicines only as told by your health care provider. ?Do not use any products that contain nicotine or tobacco. These products include cigarettes, chewing tobacco, and vaping devices, such as e-cigarettes. If you need help quitting, ask your health care provider. ?Contact a health care provider if your symptoms do  not improve after 2 weeks. ?This information is not intended to replace advice given to you by your health care provider. Make sure you discuss any questions you have with your health care provider. ?Document

## 2021-12-08 NOTE — Progress Notes (Signed)
?Virtual Visit Consent  ? ?[name], you are scheduled for a virtual visit with a [facility] provider today.   ?  ?Just as with appointments in the office, your consent must be obtained to participate.  Your consent will be active for this visit and any virtual visit you may have with one of our providers in the next 365 days.   ?  ?If you have a MyChart account, a copy of this consent can be sent to you electronically.  All virtual visits are billed to your insurance company just like a traditional visit in the office.   ? ?As this is a virtual visit, video technology does not allow for your provider to perform a traditional examination.  This may limit your provider's ability to fully assess your condition.  If your provider identifies any concerns that need to be evaluated in person or the need to arrange testing (such as labs, EKG, etc.), we will make arrangements to do so.   ?  ?Although advances in technology are sophisticated, we cannot ensure that it will always work on either your end or our end.  If the connection with a video visit is poor, the visit may have to be switched to a telephone visit.  With either a video or telephone visit, we are not always able to ensure that we have a secure connection.    ? ?I need to obtain your verbal consent now.   Are you willing to proceed with your visit today?  ?  ?BEAUTY PLESS has provided verbal consent on 12/08/2021 for a virtual visit (video or telephone). ?  ?Mar Daring, PA-C  ? ?Date: 12/08/2021 10:11 AM ? ? ?Virtual Visit via Video Note  ? ?Michelle Acevedo, connected with  Michelle Acevedo  ([id number], May 11, 1951) on 12/08/21 at 10:00 AM EDT by a video-enabled telemedicine application and verified that I am speaking with the correct person using two identifiers. ? ?Location: ?Patient: Virtual Visit Location Patient: Home ?Provider: Home office ?  ?I discussed the limitations of evaluation and management by telemedicine and the  availability of in person appointments. The patient expressed understanding and agreed to proceed.   ? ?History of Present Illness: ?Michelle Acevedo is a 71 y.o. who identifies as a female who was assigned female at birth, and is being seen today for possible bronchitis. ? ?HPI: Cough ?This is a new problem. The current episode started in the past 7 days. The problem has been gradually worsening. The problem occurs every few minutes. The cough is Productive of sputum. Associated symptoms include chest pain, myalgias, nasal congestion, postnasal drip, rhinorrhea, a sore throat (scratchy today), shortness of breath and wheezing. Pertinent negatives include no chills, ear congestion, ear pain, fever, headaches or sweats. Associated symptoms comments: fatigue. The symptoms are aggravated by lying down. Risk factors for lung disease include smoking/tobacco exposure. She has tried nothing for the symptoms. The treatment provided no relief. Her past medical history is significant for bronchitis.   ?PMH: liver transplant recipient ? ?Problems:  ?Patient Active Problem List  ? Diagnosis Date Noted  ? Personal history of tobacco use, presenting hazards to health 11/15/2016  ?  ?Allergies:  ?Allergies  ?Allergen Reactions  ? Penicillins Rash  ?  Did it involve swelling of the face/tongue/throat, SOB, or low BP? No ?Did it involve sudden or severe rash/hives, skin peeling, or any reaction on the inside of your mouth or nose? No ?Did you need to  seek medical attention at a hospital or doctor's office? No ?When did it last happen?      childhood allergy  ?If all above answers are ?NO?, may proceed with cephalosporin use. ? ?  ? ?Medications:  ?Current Outpatient Medications:  ?  albuterol (VENTOLIN HFA) 108 (90 Base) MCG/ACT inhaler, Inhale 1-2 puffs into the lungs every 6 (six) hours as needed for wheezing or shortness of breath., Disp: 18 g, Rfl: 0 ?  acetaminophen (TYLENOL) 500 MG tablet, Take 500 mg by mouth every 6 (six)  hours as needed for moderate pain or headache., Disp: , Rfl:  ?  azithromycin (ZITHROMAX) 250 MG tablet, Take 2 tablets on day 1, then 1 tablet daily on days 2 through 5, Disp: 6 tablet, Rfl: 1 ?  furosemide (LASIX) 40 MG tablet, Take 40 mg by mouth daily. , Disp: , Rfl:  ?  Ipratropium-Albuterol (COMBIVENT) 20-100 MCG/ACT AERS respimat, Inhale 2 puffs into the lungs 3 (three) times daily as needed for shortness of breath., Disp: , Rfl:  ?  lactulose (CHRONULAC) 10 GM/15ML solution, Take 10 g by mouth 3 (three) times daily as needed (for elevated ammonia level)., Disp: , Rfl:  ?  pantoprazole (PROTONIX) 40 MG tablet, Take 40 mg by mouth every morning. , Disp: , Rfl:  ?  Potassium 99 MG TABS, Take 99 mg by mouth daily., Disp: , Rfl:  ?  rifaximin (XIFAXAN) 550 MG TABS tablet, Take 550 mg by mouth every morning. , Disp: , Rfl:  ?  spironolactone (ALDACTONE) 100 MG tablet, Take 100 mg by mouth daily. , Disp: , Rfl:  ? ?Observations/Objective: ?Patient is well-developed, well-nourished in no acute distress.  ?Resting comfortably at home.  ?Head is normocephalic, atraumatic.  ?No labored breathing.  ?Speech is clear and coherent with logical content.  ?Patient is alert and oriented at baseline.  ? ? ?Assessment and Plan: ?1. Acute bacterial bronchitis ?- azithromycin (ZITHROMAX) 250 MG tablet; Take 2 tablets on day 1, then 1 tablet daily on days 2 through 5  Dispense: 6 tablet; Refill: 1 ?- albuterol (VENTOLIN HFA) 108 (90 Base) MCG/ACT inhaler; Inhale 1-2 puffs into the lungs every 6 (six) hours as needed for wheezing or shortness of breath.  Dispense: 18 g; Refill: 0 ? ?- Suspect bacterial bronchitis in immunocompromised patient ?- Zpack prescribed  ?- Albuterol for wheezing and SOB ?- May continue cough syrup she has at home ?- Steam and humidifier can help ?- Push fluids ?- Rest ?- Seek in person evaluation if not improving or if worsening ? ?Follow Up Instructions: ?I discussed the assessment and treatment plan with  the patient. The patient was provided an opportunity to ask questions and all were answered. The patient agreed with the plan and demonstrated an understanding of the instructions.  A copy of instructions were sent to the patient via MyChart unless otherwise noted below.  ? ?The patient was advised to call back or seek an in-person evaluation if the symptoms worsen or if the condition fails to improve as anticipated. ? ?Time:  ?I spent 10 minutes with the patient via telehealth technology discussing the above problems/concerns.   ? ?Mar Daring, PA-C ? ?

## 2021-12-28 ENCOUNTER — Other Ambulatory Visit: Payer: Self-pay | Admitting: Oncology

## 2021-12-28 DIAGNOSIS — Z87891 Personal history of nicotine dependence: Secondary | ICD-10-CM

## 2021-12-29 ENCOUNTER — Ambulatory Visit
Admission: RE | Admit: 2021-12-29 | Discharge: 2021-12-29 | Disposition: A | Discharge status: Home / Self Care | Payer: Medicare Other | Source: Ambulatory Visit | Attending: Internal Medicine | Admitting: Internal Medicine

## 2021-12-29 DIAGNOSIS — Z1231 Encounter for screening mammogram for malignant neoplasm of breast: Secondary | ICD-10-CM | POA: Diagnosis present

## 2021-12-30 ENCOUNTER — Encounter: Payer: Self-pay | Admitting: Physician Assistant

## 2022-01-03 ENCOUNTER — Other Ambulatory Visit: Payer: Self-pay | Admitting: *Deleted

## 2022-01-03 DIAGNOSIS — Z122 Encounter for screening for malignant neoplasm of respiratory organs: Secondary | ICD-10-CM

## 2022-01-03 DIAGNOSIS — Z87891 Personal history of nicotine dependence: Secondary | ICD-10-CM

## 2022-01-11 ENCOUNTER — Ambulatory Visit
Admission: RE | Admit: 2022-01-11 | Discharge: 2022-01-11 | Disposition: A | Discharge status: Home / Self Care | Payer: Medicare Other | Source: Ambulatory Visit | Attending: Acute Care | Admitting: Acute Care

## 2022-01-11 DIAGNOSIS — Z122 Encounter for screening for malignant neoplasm of respiratory organs: Secondary | ICD-10-CM | POA: Insufficient documentation

## 2022-01-11 DIAGNOSIS — Z87891 Personal history of nicotine dependence: Secondary | ICD-10-CM | POA: Insufficient documentation

## 2022-01-12 ENCOUNTER — Other Ambulatory Visit: Payer: Self-pay | Admitting: Acute Care

## 2022-01-12 DIAGNOSIS — Z87891 Personal history of nicotine dependence: Secondary | ICD-10-CM

## 2022-01-12 DIAGNOSIS — Z122 Encounter for screening for malignant neoplasm of respiratory organs: Secondary | ICD-10-CM

## 2022-08-27 ENCOUNTER — Emergency Department: Payer: Medicare Other

## 2022-08-27 ENCOUNTER — Other Ambulatory Visit: Payer: Self-pay

## 2022-08-27 ENCOUNTER — Emergency Department
Admission: EM | Admit: 2022-08-27 | Discharge: 2022-08-27 | Disposition: A | Discharge status: Home / Self Care | Payer: Medicare Other | Attending: Emergency Medicine | Admitting: Emergency Medicine

## 2022-08-27 DIAGNOSIS — S68022A Partial traumatic metacarpophalangeal amputation of left thumb, initial encounter: Secondary | ICD-10-CM | POA: Insufficient documentation

## 2022-08-27 DIAGNOSIS — Z23 Encounter for immunization: Secondary | ICD-10-CM | POA: Insufficient documentation

## 2022-08-27 DIAGNOSIS — W232XXA Caught, crushed, jammed or pinched between a moving and stationary object, initial encounter: Secondary | ICD-10-CM | POA: Diagnosis not present

## 2022-08-27 DIAGNOSIS — S6992XA Unspecified injury of left wrist, hand and finger(s), initial encounter: Secondary | ICD-10-CM | POA: Diagnosis present

## 2022-08-27 DIAGNOSIS — S68119A Complete traumatic metacarpophalangeal amputation of unspecified finger, initial encounter: Secondary | ICD-10-CM

## 2022-08-27 MED ORDER — OXYCODONE HCL 5 MG PO TABS: 5.0000 mg | ORAL_TABLET | Freq: Three times a day (TID) | ORAL | 0 refills | Status: AC | PRN

## 2022-08-27 MED ORDER — TETANUS-DIPHTH-ACELL PERTUSSIS 5-2.5-18.5 LF-MCG/0.5 IM SUSY
0.5000 mL | PREFILLED_SYRINGE | Freq: Once | INTRAMUSCULAR | Status: AC
  Administered 2022-08-27: 0.5 mL via INTRAMUSCULAR
  Filled 2022-08-27: qty 0.5

## 2022-08-27 MED ORDER — VANCOMYCIN HCL 1750 MG/350ML IV SOLN
1750.0000 mg | Freq: Once | INTRAVENOUS | Status: AC
  Administered 2022-08-27: 1750 mg via INTRAVENOUS
  Filled 2022-08-27 (×2): qty 350

## 2022-08-27 MED ORDER — CLINDAMYCIN HCL 300 MG PO CAPS: 300.0000 mg | ORAL_CAPSULE | Freq: Three times a day (TID) | ORAL | 0 refills | Status: AC

## 2022-08-27 MED ORDER — LIDOCAINE HCL (PF) 1 % IJ SOLN
10.0000 mL | Freq: Once | INTRAMUSCULAR | Status: AC
  Administered 2022-08-27: 10 mL
  Filled 2022-08-27: qty 10

## 2022-08-27 MED ORDER — LIDOCAINE HCL (PF) 1 % IJ SOLN
5.0000 mL | Freq: Once | INTRAMUSCULAR | Status: AC
Start: 2022-08-27 — End: 2022-08-27
  Administered 2022-08-27: 5 mL
  Filled 2022-08-27: qty 5

## 2022-08-27 MED ORDER — TRANEXAMIC ACID FOR EPISTAXIS
500.0000 mg | Freq: Once | TOPICAL | Status: AC
  Administered 2022-08-27: 500 mg via TOPICAL
  Filled 2022-08-27: qty 10

## 2022-08-27 MED ORDER — ONDANSETRON 4 MG PO TBDP
4.0000 mg | ORAL_TABLET | Freq: Once | ORAL | Status: AC
Start: 2022-08-27 — End: 2022-08-27
  Administered 2022-08-27: 4 mg via ORAL
  Filled 2022-08-27: qty 1

## 2022-08-27 MED ORDER — ACETAMINOPHEN 325 MG PO TABS
650.0000 mg | ORAL_TABLET | Freq: Once | ORAL | Status: AC
  Administered 2022-08-27: 650 mg via ORAL
  Filled 2022-08-27: qty 2

## 2022-08-27 MED ORDER — ONDANSETRON HCL 4 MG PO TABS: 4.0000 mg | ORAL_TABLET | Freq: Every day | ORAL | 1 refills | Status: AC | PRN

## 2022-08-27 MED ORDER — OXYCODONE HCL 5 MG PO TABS
5.0000 mg | ORAL_TABLET | Freq: Once | ORAL | Status: AC
  Administered 2022-08-27: 5 mg via ORAL
  Filled 2022-08-27: qty 1

## 2022-08-27 NOTE — ED Provider Triage Note (Signed)
Emergency Medicine Provider Triage Evaluation Note  Michelle Acevedo , a 71 y.o. female  was evaluated in triage.  Pt complains of left thumb injury with car door.  Not UTD with tetanus.    Review of Systems  Positive: Left thumb Avulsion tip.   Negative:   Physical Exam  There were no vitals taken for this visit. Gen:   Awake, no distress   Resp:  Normal effort  MSK:   Avulsion left thumb tip Other:    Medical Decision Making  Medically screening exam initiated at 2:24 PM.  Appropriate orders placed.  TRYNITY SKOUSEN was informed that the remainder of the evaluation will be completed by another provider, this initial triage assessment does not replace that evaluation, and the importance of remaining in the ED until their evaluation is complete.     Johnn Hai, PA-C 08/27/22 1425

## 2022-08-27 NOTE — ED Provider Notes (Addendum)
[facility] Provider Note    Event Date/Time   First MD Initiated Contact with Patient 08/27/22 1649     (approximate)   History   No chief complaint on file.   HPI  Michelle Acevedo is a 71 y.o. female   Past medical history of transplant on immunosuppression, thrombocytopenia, presents to the emergency department with an injury to her left thumb slammed in a door with a partial amputation.  Happened earlier today and sustained no other injuries.  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  History was obtained via the patient.  I reviewed external medical notes including CBC obtained on 08/22/2022 where platelet count was 93      Physical Exam   Triage Vital Signs: ED Triage Vitals  Enc Vitals Group     BP 08/27/22 1427 (!) 135/102     Pulse Rate 08/27/22 1427 76     Resp 08/27/22 1427 18     Temp 08/27/22 1427 97.8 F (36.6 C)     Temp Source 08/27/22 1427 Oral     SpO2 08/27/22 1427 97 %     Weight --      Height --      Head Circumference --      Peak Flow --      Pain Score 08/27/22 1510 7     Pain Loc --      Pain Edu? --      Excl. in [place]? --     Most recent vital signs: Vitals:   08/27/22 1427  BP: (!) 135/102  Pulse: 76  Resp: 18  Temp: 97.8 F (36.6 C)  SpO2: 97%    General: Awake, no distress.  CV:  Good peripheral perfusion.  Resp:  Normal effort.  Abd:  No distention.  Other:  Traumatic amputation of the left thumb distal portion approximately 0.5 cm with half the nail intact, some bony protrusion   ED Results / Procedures / Treatments   Labs (all labs ordered are listed, but only abnormal results are displayed) Labs Reviewed - No data to display     RADIOLOGY I independently reviewed and interpreted x-ray of the hand and see a distal tuft fracture left thumb   PROCEDURES:  Critical Care performed: No  ..Laceration  Repair  Date/Time: 08/27/2022 6:17 PM  Performed by: Lucillie Garfinkel, MD Authorized by: Lucillie Garfinkel, MD   Consent:    Consent obtained:  Verbal   Consent given by:  Patient   Risks, benefits, and alternatives were discussed: yes     Risks discussed:  Pain, need for additional repair, infection, nerve damage, poor cosmetic result, poor wound healing, tendon damage, vascular damage and retained foreign body   Alternatives discussed:  No treatment and delayed treatment Universal protocol:    Procedure explained and questions answered to patient or proxy's satisfaction: yes     Imaging studies available: yes     Patient identity confirmed:  Verbally with patient Anesthesia:    Anesthesia method:  Nerve block   Block anesthetic:  Lidocaine 1% w/o epi   Block injection procedure:  Introduced needle, anatomic landmarks identified, anatomic landmarks palpated, negative aspiration for blood and incremental injection   Block outcome:  Anesthesia achieved Laceration details:    Location:  Finger   Finger location:  L thumb   Length (cm):  2   Laceration depth: Distal fingertip amputation. Exploration:  Hemostasis achieved with:  Direct pressure   Imaging obtained: x-ray     Imaging outcome: foreign body not noted     Wound exploration: wound explored through full range of motion     Wound extent: underlying fracture   Treatment:    Area cleansed with:  Povidone-iodine   Amount of cleaning:  Standard   Irrigation solution:  Sterile saline   Irrigation method:  Syringe   Visualized foreign bodies/material removed: no     Debridement: Rongeur of bone.   Degree of undermining: Rongeur of bone. Skin repair:    Repair method: Open for secondary intention healing. Repair type:    Repair type:  Complex (Rongeur) Post-procedure details:    Dressing:  Antibiotic ointment, non-adherent dressing and sterile dressing   Procedure completion:  Tolerated    MEDICATIONS ORDERED IN  ED: Medications  lidocaine (PF) (XYLOCAINE) 1 % injection 5 mL (has no administration in time range)  vancomycin (VANCOREADY) IVPB 1750 mg/350 mL (has no administration in time range)  lidocaine (PF) (XYLOCAINE) 1 % injection 10 mL (10 mLs Other Given 08/27/22 1703)  Tdap (BOOSTRIX) injection 0.5 mL (0.5 mLs Intramuscular Given 08/27/22 1830)  oxyCODONE (Oxy IR/ROXICODONE) immediate release tablet 5 mg (5 mg Oral Given 08/27/22 1829)  acetaminophen (TYLENOL) tablet 650 mg (650 mg Oral Given 08/27/22 1829)  ondansetron (ZOFRAN-ODT) disintegrating tablet 4 mg (4 mg Oral Given 08/27/22 1829)  tranexamic acid (CYKLOKAPRON) 1000 MG/10ML topical solution 500 mg (500 mg Topical Given 08/27/22 1838)    Consultants:  I spoke with the orthopedic consultant Dr. Posey Pronto regarding care plan for this patient.   IMPRESSION / MDM / ASSESSMENT AND PLAN / ED COURSE  I reviewed the triage vital signs and the nursing notes.                              Differential diagnosis includes, but is not limited to, open fracture to thumb, nailbed injury    MDM: This is a patient with an open fracture to the thumb who is already currently on clindamycin with adequate coverage for this type of injury, complicated by the fact that she is immunosuppressed and has a low platelet count.  Consultation with orthopedic surgeon Dr. Posey Pronto to review injury, imaging, and discuss management of this injury arrange follow-up.  Spoke with Dr. Posey Pronto who recommended a rongeur of the bone to be embedded within the soft tissue.  This procedure was performed and the wound was dressed with Surgicel, TXA gauze, antibiotic ointment impregnated gauze, and dressing.  The patient was informed on pain control, to take antibiotics as prescribed, and to follow-up with Dr. Posey Pronto.  She was given strict return precautions for severe pain or signs of worsening infection.   Patient's presentation is most consistent with acute presentation with  potential threat to life or bodily function.       FINAL CLINICAL IMPRESSION(S) / ED DIAGNOSES   Final diagnoses:  Finger amputation, traumatic, initial encounter     Rx / DC Orders   ED Discharge Orders          Ordered    clindamycin (CLEOCIN) 300 MG capsule  3 times daily        08/27/22 1812    oxyCODONE (ROXICODONE) 5 MG immediate release tablet  Every 8 hours PRN        08/27/22 1814    ondansetron (ZOFRAN) 4 MG tablet  Daily PRN  08/27/22 1815             Note:  This document was prepared using Dragon voice recognition software and may include unintentional dictation errors.    Lucillie Garfinkel, MD 08/27/22 Mallie Mussel    Lucillie Garfinkel, MD 08/27/22 [place]    Lucillie Garfinkel, MD 08/27/22 [phone]

## 2022-08-27 NOTE — ED Triage Notes (Signed)
Pt slammed left thumb in car door and sliced tip of thumb and nail off. Pt is on transplant list for liver and takes anti-rejection meds. Thumb is wrapped with gauze by tech and bleeding is controlled at this time. Pt is AOX4, NAD.

## 2022-08-27 NOTE — Discharge Instructions (Addendum)
Take antibiotics as prescribed.  Take Tylenol 650 mg every 6 hours for pain.  If you have severe or breakthrough pain take oxycodone as prescribed.  Please keep your wound clean and bandaged.  If you see any signs of infection like spreading redness, pus coming from the wound, extreme pain, fevers, chills or any other worsening doctor right away or come back to the emergency department.  Call Dr. Posey Pronto for a follow-up appointment this week for wound recheck.  Thank you for choosing [place] for your health care today!  Please see your primary doctor this week for a follow up appointment.   Sometimes, in the early stages of certain disease courses it is difficult to detect in the emergency department evaluation -- so, it is important that you continue to monitor your symptoms and call your doctor right away or return to the emergency department if you develop any new or worsening symptoms.  Please go to the following website to schedule new (and existing) patient appointments:   [email or web address]  If you do not have a primary doctor try calling the following clinics to establish care:  If you have insurance:  [facility] [phone] [place] [address]   [facility]  [phone] [place] [place] [address]   If you do not have insurance:  [facility]  [phone] [address], [place] [address]   The following is another list of primary care offices in the area who are accepting new patients at this time.  Please reach out to one of them directly and let them know you would like to schedule an appointment to follow up on an Emergency Department visit, and/or to establish a new primary care provider (PCP).  There are likely other primary care clinics in the are who are accepting new patients, but this is an excellent place to start:  [place] physician:  Dr Lavon Paganini [address] [place], [place] [address] [phone]  [facility] Lead Physician: Dr Steele Sizer [address], [place], [place] [address] [phone]  [place] Physician: Dr Park Liter [address] [place], [place] [address] [phone]  [facility] Lead Physician: Dr Dewaine Oats [place], [place], [place] [address] [phone]  [place] [facility] [place] Physician: Dr Halina Maidens [address] [place] [place], [place] [address] [phone]   It was my pleasure to care for you today.   Hoover Brunette Jacelyn Grip, MD

## 2022-08-27 NOTE — Consult Note (Signed)
PHARMACY -  BRIEF ANTIBIOTIC NOTE   Pharmacy has received consult(s) for Vancomycin from an ED provider.  The patient's profile has been reviewed for ht/wt/allergies/indication/available labs.    One time order(s) placed for Vancomycin '1750mg'$  IVPB x 1 dose  Further antibiotics/pharmacy consults should be ordered by admitting physician if indicated.                       Thank you, Aadit Hagood Rodriguez-Guzman PharmD, BCPS 08/27/2022 5:53 PM

## 2022-11-29 ENCOUNTER — Other Ambulatory Visit: Payer: Self-pay | Admitting: Internal Medicine

## 2022-11-29 DIAGNOSIS — Z1231 Encounter for screening mammogram for malignant neoplasm of breast: Secondary | ICD-10-CM

## 2022-12-30 ENCOUNTER — Other Ambulatory Visit: Payer: Self-pay

## 2022-12-30 DIAGNOSIS — R140 Abdominal distension (gaseous): Secondary | ICD-10-CM

## 2022-12-30 DIAGNOSIS — Z944 Liver transplant status: Secondary | ICD-10-CM

## 2023-01-02 ENCOUNTER — Other Ambulatory Visit: Payer: Self-pay | Admitting: Physician Assistant

## 2023-01-02 ENCOUNTER — Ambulatory Visit
Admission: RE | Admit: 2023-01-02 | Discharge: 2023-01-02 | Disposition: A | Discharge status: Home / Self Care | Payer: Medicare Other | Source: Ambulatory Visit | Attending: Internal Medicine | Admitting: Internal Medicine

## 2023-01-02 DIAGNOSIS — Z944 Liver transplant status: Secondary | ICD-10-CM

## 2023-01-02 DIAGNOSIS — Z1231 Encounter for screening mammogram for malignant neoplasm of breast: Secondary | ICD-10-CM | POA: Diagnosis present

## 2023-01-02 DIAGNOSIS — R140 Abdominal distension (gaseous): Secondary | ICD-10-CM

## 2023-01-03 ENCOUNTER — Other Ambulatory Visit: Payer: Self-pay | Admitting: Acute Care

## 2023-01-03 DIAGNOSIS — Z122 Encounter for screening for malignant neoplasm of respiratory organs: Secondary | ICD-10-CM

## 2023-01-03 DIAGNOSIS — Z87891 Personal history of nicotine dependence: Secondary | ICD-10-CM

## 2023-01-04 ENCOUNTER — Ambulatory Visit
Admission: RE | Admit: 2023-01-04 | Discharge: 2023-01-04 | Disposition: A | Discharge status: Home / Self Care | Payer: Medicare Other | Source: Ambulatory Visit | Attending: Physician Assistant | Admitting: Physician Assistant

## 2023-01-04 DIAGNOSIS — R140 Abdominal distension (gaseous): Secondary | ICD-10-CM | POA: Insufficient documentation

## 2023-01-04 DIAGNOSIS — Z944 Liver transplant status: Secondary | ICD-10-CM | POA: Insufficient documentation

## 2023-01-13 ENCOUNTER — Ambulatory Visit
Admission: RE | Admit: 2023-01-13 | Discharge: 2023-01-13 | Disposition: A | Discharge status: Home / Self Care | Payer: Medicare Other | Source: Ambulatory Visit | Attending: Internal Medicine | Admitting: Internal Medicine

## 2023-01-13 DIAGNOSIS — M47814 Spondylosis without myelopathy or radiculopathy, thoracic region: Secondary | ICD-10-CM | POA: Insufficient documentation

## 2023-01-13 DIAGNOSIS — Z944 Liver transplant status: Secondary | ICD-10-CM | POA: Diagnosis not present

## 2023-01-13 DIAGNOSIS — I8510 Secondary esophageal varices without bleeding: Secondary | ICD-10-CM | POA: Insufficient documentation

## 2023-01-13 DIAGNOSIS — Z122 Encounter for screening for malignant neoplasm of respiratory organs: Secondary | ICD-10-CM | POA: Diagnosis not present

## 2023-01-13 DIAGNOSIS — K766 Portal hypertension: Secondary | ICD-10-CM | POA: Diagnosis not present

## 2023-01-13 DIAGNOSIS — J439 Emphysema, unspecified: Secondary | ICD-10-CM | POA: Diagnosis not present

## 2023-01-13 DIAGNOSIS — K7460 Unspecified cirrhosis of liver: Secondary | ICD-10-CM | POA: Diagnosis not present

## 2023-01-13 DIAGNOSIS — I3481 Nonrheumatic mitral (valve) annulus calcification: Secondary | ICD-10-CM | POA: Insufficient documentation

## 2023-01-13 DIAGNOSIS — I700 Atherosclerosis of aorta: Secondary | ICD-10-CM | POA: Diagnosis not present

## 2023-01-13 DIAGNOSIS — Z87891 Personal history of nicotine dependence: Secondary | ICD-10-CM | POA: Insufficient documentation

## 2023-01-13 DIAGNOSIS — I2510 Atherosclerotic heart disease of native coronary artery without angina pectoris: Secondary | ICD-10-CM | POA: Diagnosis not present

## 2023-03-27 ENCOUNTER — Telehealth: Payer: Self-pay | Admitting: *Deleted

## 2023-03-27 DIAGNOSIS — R911 Solitary pulmonary nodule: Secondary | ICD-10-CM

## 2023-03-27 DIAGNOSIS — Z87891 Personal history of nicotine dependence: Secondary | ICD-10-CM

## 2023-03-27 NOTE — Telephone Encounter (Signed)
Spoke with pt and advised of lung screening CT results. One nodule has grown slightly and Dr Jayme Cloud has reviewed the scan and recommends a repeat CT in 6 months . Pt verbalized understanding and is aware we will call her closer to 6 months to schedule. Order placed for 6 mth nodule f/u CT.

## 2023-07-19 ENCOUNTER — Ambulatory Visit
Admission: RE | Admit: 2023-07-19 | Discharge: 2023-07-19 | Disposition: A | Discharge status: Home / Self Care | Payer: Medicare Other | Source: Ambulatory Visit | Attending: Acute Care | Admitting: Acute Care

## 2023-07-19 DIAGNOSIS — R911 Solitary pulmonary nodule: Secondary | ICD-10-CM | POA: Diagnosis present

## 2023-07-19 DIAGNOSIS — Z87891 Personal history of nicotine dependence: Secondary | ICD-10-CM | POA: Insufficient documentation

## 2023-08-17 ENCOUNTER — Other Ambulatory Visit: Payer: Self-pay

## 2023-08-17 DIAGNOSIS — Z87891 Personal history of nicotine dependence: Secondary | ICD-10-CM

## 2023-08-17 DIAGNOSIS — Z122 Encounter for screening for malignant neoplasm of respiratory organs: Secondary | ICD-10-CM

## 2023-11-27 ENCOUNTER — Other Ambulatory Visit: Payer: Self-pay | Admitting: Internal Medicine

## 2023-11-27 DIAGNOSIS — M25562 Pain in left knee: Secondary | ICD-10-CM

## 2023-12-02 ENCOUNTER — Ambulatory Visit
Admission: RE | Admit: 2023-12-02 | Discharge: 2023-12-02 | Disposition: A | Discharge status: Home / Self Care | Source: Ambulatory Visit | Attending: Internal Medicine

## 2023-12-02 DIAGNOSIS — M25562 Pain in left knee: Secondary | ICD-10-CM

## 2023-12-05 ENCOUNTER — Other Ambulatory Visit: Payer: Self-pay | Admitting: Internal Medicine

## 2023-12-05 DIAGNOSIS — Z1231 Encounter for screening mammogram for malignant neoplasm of breast: Secondary | ICD-10-CM

## 2024-01-03 ENCOUNTER — Ambulatory Visit
Admission: RE | Admit: 2024-01-03 | Discharge: 2024-01-03 | Disposition: A | Discharge status: Home / Self Care | Source: Ambulatory Visit | Attending: Internal Medicine | Admitting: Internal Medicine

## 2024-01-03 DIAGNOSIS — Z1231 Encounter for screening mammogram for malignant neoplasm of breast: Secondary | ICD-10-CM | POA: Insufficient documentation

## 2024-01-23 ENCOUNTER — Telehealth: Payer: Self-pay | Admitting: Acute Care

## 2024-01-23 ENCOUNTER — Telehealth: Payer: Self-pay

## 2024-01-23 NOTE — Telephone Encounter (Signed)
 Copied from CRM [id number]. Topic: Clinical - Lab/Test Results >> Jan 23, 2024  2:08 PM Juliaette Ober wrote: Reason for CRM: patient calling back to speak with groce, sarah about test results, please call patient back.   ATC X1. LMTCB

## 2024-01-23 NOTE — Telephone Encounter (Signed)
 I have attempted to call the patient at her request regarding comparison of 2 CT scans that she had done recently.  Patient had her most recent low-dose CT screening scan done at [facility] November 2024.  This scan was read as a lung RADS 2.  Annual screening recommended.   She has recently had an additional scan done at [facility] Jan 12, 2024. This scan was compared to the most recent scan that have been done at [facility] which was done in 2022 and as a result they read the scan as highly suspicious for adenocarcinoma. Reviewing the most recent Dyna CAD history of the nodule concerned, the nodule has been waxing and waning the most recent reading was 8.4 mm.  We can do a follow up scan to reassess this as soon as possible.  I have left a HIPAA compliant message on the patient's VM.

## 2024-01-23 NOTE — Telephone Encounter (Signed)
 Pt came into [facility] pulmonary office asking to speak with someone to review lung screening CT done 07/19/2023 compared to a CT overead that was done by [facility] on 01/12/24 of this same CT. See note in Care everywhere from [facility] on 01/12/24 noting CT cardiothoracic outside interpretation.

## 2024-01-23 NOTE — Telephone Encounter (Signed)
 Provider notified

## 2024-01-23 NOTE — Telephone Encounter (Signed)
PT ret Ms. Michelle Acevedo call. Please try again.

## 2024-01-23 NOTE — Telephone Encounter (Signed)
 I have called the patient back at her request. She has several questions about the last low dose screening scan that was done 07/18/2024 and read 08/16/2024. She received a letter through My Chart with the results 08/16/2024. The scan was read as a LR 2. This scan had been a 6 month follow up scan to the scan  done 01/2024, which had also been read as a LR 2. Because there had been some growth in the nodule from 7.4 mm to 9.2 mm we went against the radiology read and did a 6 moth follow up scan 07/2023. At this review, the nodule of concern had decreased in size from 9.2 mm to 8.4 mm. The scan was read as a LR 2, and we ordered an annual scan.   The patient called today asking why [facility] had not received the results of her scan done in November 2024 until now. The scans are routinely sent to PCP's and the patient's , but the patient has never requested that we send them to [facility]. Looking back through My Chart, the scan was requested by Tonie Franks . 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 These are likely just apparent radiographically but has no clinical evidence of cirrhosis in graft. EGD can also serve to ensure no luminal EV but doubt underlying portal HTN post LT. Will get CT brought to [facility] for interpretation ". They requested the interpretation and over-read for EV.  Upon over read, they determined the long axis read of the right middle lobe nodule, which measured 11.4 mm by the [facility] Radiologist ,( Decreased from 12 + mm in May 2024)  was read as an 13 x 6 mm. 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 The patient states she did not get the My Chart Letter with her results 08/17/2023. It is in EPIC.  She does not understand why the scan was not sent to [facility], but there was never a request that we sent this to [facility]. They determined they wanted to better evaluate it after they noted the EV  notation of the scan upon her 12/2023 Transplant visit.  I will document this in a safety Zone Portal. I will let Dr. Santos Cullens , radiology know, and I will follow up with the patient tomorrow as I attempted to call her today during lunch for no answer, and then returned her call again after 5 pm today.

## 2024-01-23 NOTE — Telephone Encounter (Signed)
 Pt is asking to speak with someone concerning the differences between the 08/2023 LCS we did and the May 2025 CT done at [facility].

## 2024-01-24 NOTE — Telephone Encounter (Signed)
 Duplicate

## 2024-01-25 ENCOUNTER — Telehealth: Payer: Self-pay | Admitting: Acute Care

## 2024-01-25 NOTE — Telephone Encounter (Signed)
Thank you for addressing

## 2024-01-25 NOTE — Telephone Encounter (Signed)
 I have attempted to call the patient to discuss further her 08/02/2023 low-dose CT scan.  There was no answer.  I left a HIPAA compliant message with contact information for the patient to call the office so we can discuss the situation further.  Nothing further is needed

## 2024-01-31 ENCOUNTER — Telehealth: Payer: Self-pay | Admitting: *Deleted

## 2024-01-31 NOTE — Telephone Encounter (Signed)
 See last encounter. PT is ret M. Groce's call.

## 2024-02-01 ENCOUNTER — Other Ambulatory Visit: Payer: Self-pay | Admitting: Internal Medicine

## 2024-02-01 DIAGNOSIS — R0609 Other forms of dyspnea: Secondary | ICD-10-CM

## 2024-02-06 ENCOUNTER — Telehealth (HOSPITAL_COMMUNITY): Payer: Self-pay

## 2024-02-06 NOTE — Telephone Encounter (Signed)
 Called and spoke with pt in regards to PR, pt stated she would like to attend PR at [facility]. Adv pt I will fax her referral to PR.

## 2024-02-06 NOTE — Telephone Encounter (Signed)
 Outside/paper referral received by Dr. Mal Screen from [facility]. Will fax over Physician order and request further documents. Insurance benefits and eligibility to be determined.

## 2024-02-07 ENCOUNTER — Telehealth (HOSPITAL_COMMUNITY): Payer: Self-pay

## 2024-02-07 NOTE — Telephone Encounter (Signed)
 Patient called asking why [facility] has not called her yet to schedule her. Reviewed patient's chart and saw we had received a fax referral from [facility] yesterday, spoken with her, and then immediately sent the referral to [facility] yesterday afternoon. Gave patient [facility]'s number to contact them regarding scheduling.

## 2024-02-14 ENCOUNTER — Encounter: Attending: Thoracic Surgery (Cardiothoracic Vascular Surgery)

## 2024-02-14 ENCOUNTER — Encounter

## 2024-02-14 ENCOUNTER — Other Ambulatory Visit: Payer: Self-pay

## 2024-02-14 DIAGNOSIS — Z87891 Personal history of nicotine dependence: Secondary | ICD-10-CM | POA: Insufficient documentation

## 2024-02-14 DIAGNOSIS — R0609 Other forms of dyspnea: Secondary | ICD-10-CM

## 2024-02-14 DIAGNOSIS — J439 Emphysema, unspecified: Secondary | ICD-10-CM | POA: Insufficient documentation

## 2024-02-14 NOTE — Progress Notes (Signed)
 Patient arrived to orientation in sandals. No walk test was completed, informed patient about program guidelines, and completed paperwork.

## 2024-02-16 ENCOUNTER — Encounter

## 2024-02-16 VITALS — Ht 61.5 in | Wt 166.1 lb

## 2024-02-16 DIAGNOSIS — R0609 Other forms of dyspnea: Secondary | ICD-10-CM | POA: Diagnosis present

## 2024-02-16 DIAGNOSIS — J439 Emphysema, unspecified: Secondary | ICD-10-CM | POA: Diagnosis not present

## 2024-02-16 DIAGNOSIS — Z87891 Personal history of nicotine dependence: Secondary | ICD-10-CM | POA: Diagnosis not present

## 2024-02-16 NOTE — Progress Notes (Signed)
 Daily Session Note  Patient Details  Name: GRIFFIN DEWILDE MRN: [id number] Date of Birth: 1950-12-01 Referring Provider:    Encounter Date: 02/16/2024  Check In:  Session Check In - 02/16/24 1106       Check-In   Supervising physician immediately available to respond to emergencies See telemetry face sheet for immediately available ER MD    Location [facility]-Cardiac & Pulmonary Rehab    Staff Present Sherle Dire, BS, Exercise Physiologist;Smiley Birr Sabra Cramp BS, ACSM CEP, Exercise Physiologist;Kihanna Kamiya RN,BSN,MPA;Maxon Conetta BS, Exercise Physiologist    Virtual Visit No    Medication changes reported     No    Fall or balance concerns reported    No    Tobacco Cessation No Change    Warm-up and Cool-down Performed on first and last piece of equipment    Resistance Training Performed Yes    VAD Patient? No    PAD/SET Patient? No      Pain Assessment   Currently in Pain? No/denies                Social History   Tobacco Use  Smoking Status Former   Current packs/day: 1.50   Average packs/day: 1.5 packs/day for 30.0 years (45.0 ttl pk-yrs)   Types: Cigarettes  Smokeless Tobacco Never  Tobacco Comments   Quit in 2021.    Goals Met:  Independence with exercise equipment Exercise tolerated well No report of concerns or symptoms today Strength training completed today  Goals Unmet:  Not Applicable  Comments: First full day of exercise!  Patient was oriented to gym and equipment including functions, settings, policies, and procedures.  Patient's individual exercise prescription and treatment plan were reviewed.  All starting workloads were established based on the results of the 6 minute walk test done at initial orientation visit.  The plan for exercise progression was also introduced and progression will be customized based on patient's performance and goals.    Dr. Firman Hughes is Medical Director for [facility] Cardiac Rehabilitation.  Dr. Fuad Aleskerov is  Medical Director for [facility] Pulmonary Rehabilitation.

## 2024-02-16 NOTE — Patient Instructions (Signed)
 Patient Instructions  Patient Details  Name: Michelle Acevedo MRN: [id number] Date of Birth: 11-14-1950 Referring Provider:  Little Riff, MD  Below are your personal goals for exercise, nutrition, and risk factors. Our goal is to help you stay on track towards obtaining and maintaining these goals. We will be discussing your progress on these goals with you throughout the program.  Initial Exercise Prescription:  Initial Exercise Prescription - 02/16/24 1100       Date of Initial Exercise RX and Referring Provider   Date 02/16/24    Referring Provider Dr. Derl Flemings      Oxygen   Maintain Oxygen Saturation 88% or higher      Treadmill   MPH 2.8    Grade 0    Minutes 15    METs 2.9      Recumbant Bike   Level 3    RPM 50    Watts 25    Minutes 15    METs 2.9      NuStep   Level 3    SPM 80    Minutes 15    METs 2.9      Rower   Level 1    Watts 25    Minutes 15    METs 2.9      Intensity   THRR 40-80% of Max Heartrate 109-133    Ratings of Perceived Exertion 11-13    Perceived Dyspnea 0-4      Resistance Training   Training Prescription Yes    Weight 3lb    Reps 10-15             Exercise Goals: Frequency: Be able to perform aerobic exercise two to three times per week in program working toward 2-5 days per week of home exercise.  Intensity: Work with a perceived exertion of 11 (fairly light) - 15 (hard) while following your exercise prescription.  We will make changes to your prescription with you as you progress through the program.   Duration: Be able to do 30 to 45 minutes of continuous aerobic exercise in addition to a 5 minute warm-up and a 5 minute cool-down routine.   Nutrition Goals: Your personal nutrition goals will be established when you do your nutrition analysis with the dietician.  The following are general nutrition guidelines to follow: Cholesterol < 200mg /day Sodium < 1500mg /day Fiber: Women over 50 yrs - 21 grams per  day  Personal Goals:  Personal Goals and Risk Factors at Admission - 02/14/24 1342       Core Components/Risk Factors/Patient Goals on Admission    Weight Management Yes;Weight Loss    Intervention Weight Management: Develop a combined nutrition and exercise program designed to reach desired caloric intake, while maintaining appropriate intake of nutrient and fiber, sodium and fats, and appropriate energy expenditure required for the weight goal.;Weight Management: Provide education and appropriate resources to help participant work on and attain dietary goals.;Weight Management/Obesity: Establish reasonable short term and long term weight goals.    Expected Outcomes Short Term: Continue to assess and modify interventions until short term weight is achieved;Long Term: Adherence to nutrition and physical activity/exercise program aimed toward attainment of established weight goal;Weight Loss: Understanding of general recommendations for a balanced deficit meal plan, which promotes 1-2 lb weight loss per week and includes a negative energy balance of [phone] kcal/d;Understanding recommendations for meals to include 15-35% energy as protein, 25-35% energy from fat, 35-60% energy from carbohydrates, less than 200mg  of dietary cholesterol,  20-35 gm of total fiber daily;Understanding of distribution of calorie intake throughout the day with the consumption of 4-5 meals/snacks    Improve shortness of breath with ADL's Yes    Intervention Provide education, individualized exercise plan and daily activity instruction to help decrease symptoms of SOB with activities of daily living.    Expected Outcomes Short Term: Improve cardiorespiratory fitness to achieve a reduction of symptoms when performing ADLs;Long Term: Be able to perform more ADLs without symptoms or delay the onset of symptoms             Exercise Goals and Review:  Exercise Goals     Row Name 02/16/24 1130             Exercise Goals    Increase Physical Activity Yes       Intervention Provide advice, education, support and counseling about physical activity/exercise needs.;Develop an individualized exercise prescription for aerobic and resistive training based on initial evaluation findings, risk stratification, comorbidities and participant's personal goals.       Expected Outcomes Short Term: Attend rehab on a regular basis to increase amount of physical activity.;Long Term: Add in home exercise to make exercise part of routine and to increase amount of physical activity.;Long Term: Exercising regularly at least 3-5 days a week.       Increase Strength and Stamina Yes       Intervention Provide advice, education, support and counseling about physical activity/exercise needs.;Develop an individualized exercise prescription for aerobic and resistive training based on initial evaluation findings, risk stratification, comorbidities and participant's personal goals.       Expected Outcomes Short Term: Increase workloads from initial exercise prescription for resistance, speed, and METs.;Short Term: Perform resistance training exercises routinely during rehab and add in resistance training at home;Long Term: Improve cardiorespiratory fitness, muscular endurance and strength as measured by increased METs and functional capacity ( )       Able to understand and use rate of perceived exertion (RPE) scale Yes       Intervention Provide education and explanation on how to use RPE scale       Expected Outcomes Short Term: Able to use RPE daily in rehab to express subjective intensity level;Long Term:  Able to use RPE to guide intensity level when exercising independently       Able to understand and use Dyspnea scale Yes       Intervention Provide education and explanation on how to use Dyspnea scale       Expected Outcomes Short Term: Able to use Dyspnea scale daily in rehab to express subjective sense of shortness of breath during  exertion;Long Term: Able to use Dyspnea scale to guide intensity level when exercising independently       Knowledge and understanding of Target Heart Rate Range (THRR) Yes       Intervention Provide education and explanation of THRR including how the numbers were predicted and where they are located for reference       Expected Outcomes Short Term: Able to state/look up THRR;Short Term: Able to use daily as guideline for intensity in rehab;Long Term: Able to use THRR to govern intensity when exercising independently       Able to check pulse independently Yes       Intervention Provide education and demonstration on how to check pulse in carotid and radial arteries.;Review the importance of being able to check your own pulse for safety during independent exercise  Expected Outcomes Short Term: Able to explain why pulse checking is important during independent exercise;Long Term: Able to check pulse independently and accurately       Understanding of Exercise Prescription Yes       Intervention Provide education, explanation, and written materials on patient's individual exercise prescription       Expected Outcomes Short Term: Able to explain program exercise prescription;Long Term: Able to explain home exercise prescription to exercise independently                Copy of goals given to participant.

## 2024-02-16 NOTE — Progress Notes (Signed)
 Pulmonary Individual Treatment Plan  Patient Details  Name: Michelle Acevedo MRN: [id number] Date of Birth: Jan 19, 1951 Referring Provider:   Flowsheet Row Pulmonary Rehab from 02/16/2024 in [facility] Cardiac and Pulmonary Rehab  Referring Provider Dr. Derl Flemings       Initial Encounter Date:  Flowsheet Row Pulmonary Rehab from 02/16/2024 in [facility] Cardiac and Pulmonary Rehab  Date 02/16/24       Visit Diagnosis: DOE (dyspnea on exertion)  Patient's Home Medications on Admission:  Current Outpatient Medications:    acetaminophen  (TYLENOL ) 325 MG tablet, Take 325 mg by mouth., Disp: , Rfl:    acetaminophen  (TYLENOL ) 500 MG tablet, Take 500 mg by mouth every 6 (six) hours as needed for moderate pain or headache., Disp: , Rfl:    albuterol  (VENTOLIN  HFA) 108 (90 Base) MCG/ACT inhaler, Inhale 1-2 puffs into the lungs every 6 (six) hours as needed for wheezing or shortness of breath. (Patient not taking: Reported on 02/14/2024), Disp: 18 g, Rfl: 0   albuterol  (VENTOLIN  HFA) 108 (90 Base) MCG/ACT inhaler, Inhale 2 puffs into the lungs., Disp: , Rfl:    Albuterol  Sulfate (PROAIR  RESPICLICK) 108 (90 Base) MCG/ACT AEPB, Inhale 2 puffs into the lungs., Disp: , Rfl:    furosemide (LASIX) 40 MG tablet, Take 40 mg by mouth daily.  (Patient not taking: Reported on 02/14/2024), Disp: , Rfl:    Ipratropium-Albuterol  (COMBIVENT) 20-100 MCG/ACT AERS respimat, Inhale 2 puffs into the lungs 3 (three) times daily as needed for shortness of breath., Disp: , Rfl:    lactulose (CHRONULAC) 10 GM/15ML solution, Take 10 g by mouth 3 (three) times daily as needed (for elevated ammonia level)., Disp: , Rfl:    NON FORMULARY, B3, Disp: , Rfl:    oxyCODONE  (ROXICODONE ) 5 MG immediate release tablet, Take 1 tablet (5 mg total) by mouth every 8 (eight) hours as needed for up to 16 doses for breakthrough pain or severe pain. (Patient not taking: Reported on 02/14/2024), Disp: 16 tablet, Rfl: 0   pantoprazole (PROTONIX) 40 MG tablet,  Take 40 mg by mouth every morning.  (Patient not taking: Reported on 02/14/2024), Disp: , Rfl:    pantoprazole (PROTONIX) 40 MG tablet, Take 40 mg by mouth., Disp: , Rfl:    Potassium 99 MG TABS, Take 99 mg by mouth daily., Disp: , Rfl:    rifaximin (XIFAXAN) 550 MG TABS tablet, Take 550 mg by mouth every morning.  (Patient not taking: Reported on 02/14/2024), Disp: , Rfl:    spironolactone (ALDACTONE) 100 MG tablet, Take 100 mg by mouth daily. , Disp: , Rfl:    tacrolimus (PROGRAF) 5 MG capsule, Take 25 mg by mouth 2 (two) times daily., Disp: , Rfl:    traZODone (DESYREL) 50 MG tablet, Take 50 mg by mouth at bedtime., Disp: , Rfl:   Past Medical History: Past Medical History:  Diagnosis Date   Anemia    Arthritis    KNEE KLEFT   Cancer (HCC)    Labia   Chronic hepatitis C with cirrhosis (HCC)    Cirrhosis (HCC)    Emphysema of lung (HCC)    mild   GERD (gastroesophageal reflux disease)    Hepatitis C    History of esophageal varices    Hx of transfusion of whole blood    Macular degeneration    LEFT EYE-RECEIVES INJECTION IN LEFT EYE FOR THIS EVERY 9 WEEKS    Tobacco Use: Social History   Tobacco Use  Smoking Status Former  Current packs/day: 1.50   Average packs/day: 1.5 packs/day for 30.0 years (45.0 ttl pk-yrs)   Types: Cigarettes  Smokeless Tobacco Never  Tobacco Comments   Quit in 2021.    Labs: Review Flowsheet        No data to display           Pulmonary Assessment Scores:  Pulmonary Assessment Scores     Row Name 02/14/24 1352 02/14/24 1450       ADL UCSD   ADL Phase Entry Entry    SOB Score total -- 3    Rest -- 0    Walk -- 0    Stairs -- 3    Bath -- 0    Dress -- 0    Shop -- 0      CAT Score   CAT Score 21 --             UCSD: Self-administered rating of dyspnea associated with activities of daily living (ADLs) 6-point scale (0 = "not at all" to 5 = "maximal or unable to do because of breathlessness")  Scoring Scores range  from 0 to 120.  Minimally important difference is 5 units  CAT: CAT can identify the health impairment of COPD patients and is better correlated with disease progression.  CAT has a scoring range of zero to 40. The CAT score is classified into four groups of low (less than 10), medium (10 - 20), high (21-30) and very high (31-40) based on the impact level of disease on health status. A CAT score over 10 suggests significant symptoms.  A worsening CAT score could be explained by an exacerbation, poor medication adherence, poor inhaler technique, or progression of COPD or comorbid conditions.  CAT MCID is 2 points  mMRC: mMRC (Modified Medical Research Council) Dyspnea Scale is used to assess the degree of baseline functional disability in patients of respiratory disease due to dyspnea. No minimal important difference is established. A decrease in score of 1 point or greater is considered a positive change.   Pulmonary Function Assessment:  Pulmonary Function Assessment - 02/14/24 1341       Breath   Shortness of Breath Yes;Limiting activity             Exercise Target Goals: Exercise Program Goal: Individual exercise prescription set using results from initial 6 min walk test and THRR while considering  patient's activity barriers and safety.   Exercise Prescription Goal: Initial exercise prescription builds to 30-45 minutes a day of aerobic activity, 2-3 days per week.  Home exercise guidelines will be given to patient during program as part of exercise prescription that the participant will acknowledge.  Education: Aerobic Exercise: - Group verbal and visual presentation on the components of exercise prescription. Introduces F.I.T.T principle from ACSM for exercise prescriptions.  Reviews F.I.T.T. principles of aerobic exercise including progression. Written material given at graduation.   Education: Resistance Exercise: - Group verbal and visual presentation on the components of  exercise prescription. Introduces F.I.T.T principle from ACSM for exercise prescriptions  Reviews F.I.T.T. principles of resistance exercise including progression. Written material given at graduation.    Education: Exercise & Equipment Safety: - Individual verbal instruction and demonstration of equipment use and safety with use of the equipment. Flowsheet Row Pulmonary Rehab from 02/14/2024 in [facility] Cardiac and Pulmonary Rehab  Date 02/14/24  Educator [facility]  Instruction Review Code 1- Verbalizes Understanding       Education: Exercise Physiology & General Exercise Guidelines: - Group  verbal and written instruction with models to review the exercise physiology of the cardiovascular system and associated critical values. Provides general exercise guidelines with specific guidelines to those with heart or lung disease.  Flowsheet Row Pulmonary Rehab from 02/14/2024 in [facility] Cardiac and Pulmonary Rehab  Education need identified 02/14/24       Education: Flexibility, Balance, Mind/Body Relaxation: - Group verbal and visual presentation with interactive activity on the components of exercise prescription. Introduces F.I.T.T principle from ACSM for exercise prescriptions. Reviews F.I.T.T. principles of flexibility and balance exercise training including progression. Also discusses the mind body connection.  Reviews various relaxation techniques to help reduce and manage stress (i.e. Deep breathing, progressive muscle relaxation, and visualization). Balance handout provided to take home. Written material given at graduation.   Activity Barriers & Risk Stratification:  Activity Barriers & Cardiac Risk Stratification - 02/16/24 1121       Activity Barriers & Cardiac Risk Stratification   Activity Barriers Joint Problems;Shortness of Breath             6 Minute Walk:  6 Minute Walk     Row Name 02/16/24 1116         6 Minute Walk   Phase Initial     Distance 1480 feet     Walk Time 6  minutes     # of Rest Breaks 0     MPH 2.8     METS 2.9     RPE 9     Perceived Dyspnea  0     VO2 Peak 10.8     Symptoms No     Resting HR 86 bpm     Resting BP 128/60     Resting Oxygen Saturation  95 %     Exercise Oxygen Saturation  during 6 min walk 92 %     Max Ex. HR 115 bpm     Max Ex. BP 144/68     2 Minute Post BP 124/62       Interval HR   1 Minute HR 100     2 Minute HR 115     3 Minute HR 115     4 Minute HR 71     5 Minute HR 115     6 Minute HR 111     2 Minute Post HR 89     Interval Heart Rate? Yes       Interval Oxygen   Interval Oxygen? Yes     Baseline Oxygen Saturation % 95 %     1 Minute Oxygen Saturation % 95 %     1 Minute Liters of Oxygen 0 L     2 Minute Oxygen Saturation % 98 %     2 Minute Liters of Oxygen 0 L     3 Minute Oxygen Saturation % 93 %     3 Minute Liters of Oxygen 0 L     4 Minute Oxygen Saturation % 92 %     4 Minute Liters of Oxygen 0 L     5 Minute Oxygen Saturation % 97 %     5 Minute Liters of Oxygen 0 L     6 Minute Oxygen Saturation % 97 %     6 Minute Liters of Oxygen 0 L     2 Minute Post Oxygen Saturation % 98 %     2 Minute Post Liters of Oxygen 0 L  Oxygen Initial Assessment:  Oxygen Initial Assessment - 02/14/24 1341       Home Oxygen   Home Oxygen Device None    Sleep Oxygen Prescription None    Home Exercise Oxygen Prescription None    Home Resting Oxygen Prescription None      Initial 6 min Walk   Oxygen Used None      Program Oxygen Prescription   Program Oxygen Prescription None      Intervention   Short Term Goals To learn and exhibit compliance with exercise, home and travel O2 prescription;To learn and understand importance of monitoring SPO2 with pulse oximeter and demonstrate accurate use of the pulse oximeter.;To learn and understand importance of maintaining oxygen saturations>88%;To learn and demonstrate proper pursed lip breathing techniques or other breathing techniques.  ;To learn and demonstrate proper use of respiratory medications    Long  Term Goals Exhibits compliance with exercise, home  and travel O2 prescription;Verbalizes importance of monitoring SPO2 with pulse oximeter and return demonstration;Maintenance of O2 saturations>88%;Exhibits proper breathing techniques, such as pursed lip breathing or other method taught during program session;Compliance with respiratory medication;Demonstrates proper use of MDI's             Oxygen Re-Evaluation:  Oxygen Re-Evaluation     Row Name 02/16/24 1108             Program Oxygen Prescription   Program Oxygen Prescription None         Home Oxygen   Home Oxygen Device None       Sleep Oxygen Prescription None       Home Exercise Oxygen Prescription None       Home Resting Oxygen Prescription None         Goals/Expected Outcomes   Short Term Goals To learn and exhibit compliance with exercise, home and travel O2 prescription;To learn and understand importance of monitoring SPO2 with pulse oximeter and demonstrate accurate use of the pulse oximeter.;To learn and understand importance of maintaining oxygen saturations>88%;To learn and demonstrate proper pursed lip breathing techniques or other breathing techniques. ;To learn and demonstrate proper use of respiratory medications       Long  Term Goals Exhibits compliance with exercise, home  and travel O2 prescription;Verbalizes importance of monitoring SPO2 with pulse oximeter and return demonstration;Maintenance of O2 saturations>88%;Exhibits proper breathing techniques, such as pursed lip breathing or other method taught during program session;Compliance with respiratory medication;Demonstrates proper use of MDI's       Comments Reviewed PLB technique with pt.  Talked about how it works and it's importance in maintaining their exercise saturations.       Goals/Expected Outcomes Short: Become more profiecient at using PLB. Long: Become independent at using  PLB.                Oxygen Discharge (Final Oxygen Re-Evaluation):  Oxygen Re-Evaluation - 02/16/24 1108       Program Oxygen Prescription   Program Oxygen Prescription None      Home Oxygen   Home Oxygen Device None    Sleep Oxygen Prescription None    Home Exercise Oxygen Prescription None    Home Resting Oxygen Prescription None      Goals/Expected Outcomes   Short Term Goals To learn and exhibit compliance with exercise, home and travel O2 prescription;To learn and understand importance of monitoring SPO2 with pulse oximeter and demonstrate accurate use of the pulse oximeter.;To learn and understand importance of maintaining oxygen saturations>88%;To learn and demonstrate proper  pursed lip breathing techniques or other breathing techniques. ;To learn and demonstrate proper use of respiratory medications    Long  Term Goals Exhibits compliance with exercise, home  and travel O2 prescription;Verbalizes importance of monitoring SPO2 with pulse oximeter and return demonstration;Maintenance of O2 saturations>88%;Exhibits proper breathing techniques, such as pursed lip breathing or other method taught during program session;Compliance with respiratory medication;Demonstrates proper use of MDI's    Comments Reviewed PLB technique with pt.  Talked about how it works and it's importance in maintaining their exercise saturations.    Goals/Expected Outcomes Short: Become more profiecient at using PLB. Long: Become independent at using PLB.             Initial Exercise Prescription:  Initial Exercise Prescription - 02/16/24 1100       Date of Initial Exercise RX and Referring Provider   Date 02/16/24    Referring Provider Dr. Derl Flemings      Oxygen   Maintain Oxygen Saturation 88% or higher      Treadmill   MPH 2.8    Grade 0    Minutes 15    METs 2.9      Recumbant Bike   Level 3    RPM 50    Watts 25    Minutes 15    METs 2.9      NuStep   Level 3    SPM 80     Minutes 15    METs 2.9      Rower   Level 1    Watts 25    Minutes 15    METs 2.9      Intensity   THRR 40-80% of Max Heartrate 109-133    Ratings of Perceived Exertion 11-13    Perceived Dyspnea 0-4      Resistance Training   Training Prescription Yes    Weight 3lb    Reps 10-15             Perform Capillary Blood Glucose checks as needed.  Exercise Prescription Changes:   Exercise Prescription Changes     Row Name 02/16/24 1100             Response to Exercise   Blood Pressure (Admit) 128/60       Blood Pressure (Exercise) 144/68       Blood Pressure (Exit) 124/62       Heart Rate (Admit) 86 bpm       Heart Rate (Exercise) 115 bpm       Heart Rate (Exit) 89 bpm       Oxygen Saturation (Admit) 95 %       Oxygen Saturation (Exercise) 92 %       Oxygen Saturation (Exit) 98 %       Rating of Perceived Exertion (Exercise) 9       Perceived Dyspnea (Exercise) 0       Symptoms R knee pain       Comments results                Exercise Comments:   Exercise Comments     Row Name 02/16/24 1107           Exercise Comments First full day of exercise!  Patient was oriented to gym and equipment including functions, settings, policies, and procedures.  Patient's individual exercise prescription and treatment plan were reviewed.  All starting workloads were established based on the results of the 6 minute walk test done  at initial orientation visit.  The plan for exercise progression was also introduced and progression will be customized based on patient's performance and goals.                Exercise Goals and Review:   Exercise Goals     Row Name 02/16/24 1130             Exercise Goals   Increase Physical Activity Yes       Intervention Provide advice, education, support and counseling about physical activity/exercise needs.;Develop an individualized exercise prescription for aerobic and resistive training based on initial evaluation  findings, risk stratification, comorbidities and participant's personal goals.       Expected Outcomes Short Term: Attend rehab on a regular basis to increase amount of physical activity.;Long Term: Add in home exercise to make exercise part of routine and to increase amount of physical activity.;Long Term: Exercising regularly at least 3-5 days a week.       Increase Strength and Stamina Yes       Intervention Provide advice, education, support and counseling about physical activity/exercise needs.;Develop an individualized exercise prescription for aerobic and resistive training based on initial evaluation findings, risk stratification, comorbidities and participant's personal goals.       Expected Outcomes Short Term: Increase workloads from initial exercise prescription for resistance, speed, and METs.;Short Term: Perform resistance training exercises routinely during rehab and add in resistance training at home;Long Term: Improve cardiorespiratory fitness, muscular endurance and strength as measured by increased METs and functional capacity ( )       Able to understand and use rate of perceived exertion (RPE) scale Yes       Intervention Provide education and explanation on how to use RPE scale       Expected Outcomes Short Term: Able to use RPE daily in rehab to express subjective intensity level;Long Term:  Able to use RPE to guide intensity level when exercising independently       Able to understand and use Dyspnea scale Yes       Intervention Provide education and explanation on how to use Dyspnea scale       Expected Outcomes Short Term: Able to use Dyspnea scale daily in rehab to express subjective sense of shortness of breath during exertion;Long Term: Able to use Dyspnea scale to guide intensity level when exercising independently       Knowledge and understanding of Target Heart Rate Range (THRR) Yes       Intervention Provide education and explanation of THRR including how the numbers  were predicted and where they are located for reference       Expected Outcomes Short Term: Able to state/look up THRR;Short Term: Able to use daily as guideline for intensity in rehab;Long Term: Able to use THRR to govern intensity when exercising independently       Able to check pulse independently Yes       Intervention Provide education and demonstration on how to check pulse in carotid and radial arteries.;Review the importance of being able to check your own pulse for safety during independent exercise       Expected Outcomes Short Term: Able to explain why pulse checking is important during independent exercise;Long Term: Able to check pulse independently and accurately       Understanding of Exercise Prescription Yes       Intervention Provide education, explanation, and written materials on patient's individual exercise prescription       Expected  Outcomes Short Term: Able to explain program exercise prescription;Long Term: Able to explain home exercise prescription to exercise independently                Exercise Goals Re-Evaluation :  Exercise Goals Re-Evaluation     Row Name 02/16/24 1107             Exercise Goal Re-Evaluation   Exercise Goals Review Able to understand and use rate of perceived exertion (RPE) scale;Knowledge and understanding of Target Heart Rate Range (THRR);Understanding of Exercise Prescription;Increase Physical Activity;Increase Strength and Stamina;Able to understand and use Dyspnea scale;Able to check pulse independently       Comments Reviewed RPE and dyspnea scale, THR and program prescription with pt today.  Pt voiced understanding and was given a copy of goals to take home.       Expected Outcomes Short: Use RPE daily to regulate intensity. Long: Follow program prescription in THR.                Discharge Exercise Prescription (Final Exercise Prescription Changes):  Exercise Prescription Changes - 02/16/24 1100       Response to  Exercise   Blood Pressure (Admit) 128/60    Blood Pressure (Exercise) 144/68    Blood Pressure (Exit) 124/62    Heart Rate (Admit) 86 bpm    Heart Rate (Exercise) 115 bpm    Heart Rate (Exit) 89 bpm    Oxygen Saturation (Admit) 95 %    Oxygen Saturation (Exercise) 92 %    Oxygen Saturation (Exit) 98 %    Rating of Perceived Exertion (Exercise) 9    Perceived Dyspnea (Exercise) 0    Symptoms R knee pain    Comments results             Nutrition:  Target Goals: Understanding of nutrition guidelines, daily intake of sodium 1500mg , cholesterol 200mg , calories 30% from fat and 7% or less from saturated fats, daily to have 5 or more servings of fruits and vegetables.  Education: All About Nutrition: -Group instruction provided by verbal, written material, interactive activities, discussions, models, and posters to present general guidelines for heart healthy nutrition including fat, fiber, MyPlate, the role of sodium in heart healthy nutrition, utilization of the nutrition label, and utilization of this knowledge for meal planning. Follow up email sent as well. Written material given at graduation.   Biometrics:  Pre Biometrics - 02/16/24 1131       Pre Biometrics   Height 5' 1.5" (1.562 m)    Weight 166 lb 1.6 oz (75.3 kg)    Waist Circumference 39 inches    Hip Circumference 45.5 inches    Waist to Hip Ratio 0.86 %    BMI (Calculated) 30.88    Single Leg Stand 3.5 seconds              Nutrition Therapy Plan and Nutrition Goals:   Nutrition Assessments:  MEDIFICTS Score Key: >=70 Need to make dietary changes  40-70 Heart Healthy Diet <= 40 Therapeutic Level Cholesterol Diet  Flowsheet Row Pulmonary Rehab from 02/16/2024 in [facility] Cardiac and Pulmonary Rehab  Picture Your Plate Total Score on Admission 50      Picture Your Plate Scores: <78 Unhealthy dietary pattern with much room for improvement. 41-50 Dietary pattern unlikely to meet recommendations for  good health and room for improvement. 51-60 More healthful dietary pattern, with some room for improvement.  >60 Healthy dietary pattern, although there may be some specific behaviors  that could be improved.   Nutrition Goals Re-Evaluation:   Nutrition Goals Discharge (Final Nutrition Goals Re-Evaluation):   Psychosocial: Target Goals: Acknowledge presence or absence of significant depression and/or stress, maximize coping skills, provide positive support system. Participant is able to verbalize types and ability to use techniques and skills needed for reducing stress and depression.   Education: Stress, Anxiety, and Depression - Group verbal and visual presentation to define topics covered.  Reviews how body is impacted by stress, anxiety, and depression.  Also discusses healthy ways to reduce stress and to treat/manage anxiety and depression.  Written material given at graduation.   Education: Sleep Hygiene -Provides group verbal and written instruction about how sleep can affect your health.  Define sleep hygiene, discuss sleep cycles and impact of sleep habits. Review good sleep hygiene tips.    Initial Review & Psychosocial Screening:  Initial Psych Review & Screening - 02/14/24 1348       Initial Review   Current issues with Current Sleep Concerns      Family Dynamics   Good Support System? Yes    Comments Michelle Acevedo can look to her sisters, boyfriend, niece and friends for support. She takes medication for sleep and does not take anything else for her mood.      Barriers   Psychosocial barriers to participate in program The patient should benefit from training in stress management and relaxation.      Screening Interventions   Interventions To provide support and resources with identified psychosocial needs;Encouraged to exercise;Provide feedback about the scores to participant    Expected Outcomes Short Term goal: Utilizing psychosocial counselor, staff and physician to assist  with identification of specific Stressors or current issues interfering with healing process. Setting desired goal for each stressor or current issue identified.;Long Term Goal: Stressors or current issues are controlled or eliminated.;Short Term goal: Identification and review with participant of any Quality of Life or Depression concerns found by scoring the questionnaire.;Long Term goal: The participant improves quality of Life and PHQ9 Scores as seen by post scores and/or verbalization of changes             Quality of Life Scores:  Scores of 19 and below usually indicate a poorer quality of life in these areas.  A difference of  2-3 points is a clinically meaningful difference.  A difference of 2-3 points in the total score of the Quality of Life Index has been associated with significant improvement in overall quality of life, self-image, physical symptoms, and general health in studies assessing change in quality of life.  PHQ-9: Review Flowsheet       02/14/2024  Depression screen PHQ 2/9  Decreased Interest 0 0  Down, Depressed, Hopeless 0 0  PHQ - 2 Score 0 0  Altered sleeping 3 3  Tired, decreased energy 1 1  Change in appetite 1 1  Feeling bad or failure about yourself  0 0  Trouble concentrating 0 0  Moving slowly or fidgety/restless 0 0  Suicidal thoughts 0 0  PHQ-9 Score 5 5  Difficult doing work/chores Not difficult at all Not difficult at all    Details       Multiple values from one day are sorted in reverse-chronological order        Interpretation of Total Score  Total Score Depression Severity:  1-4 = Minimal depression, 5-9 = Mild depression, 10-14 = Moderate depression, 15-19 = Moderately severe depression, 20-27 = Severe depression   Psychosocial Evaluation  and Intervention:  Psychosocial Evaluation - 02/14/24 1349       Psychosocial Evaluation & Interventions   Interventions Relaxation education;Encouraged to exercise with the program and follow  exercise prescription;Stress management education    Comments Michelle Acevedo can look to her sisters, boyfriend, niece and friends for support. She takes medication for sleep and does not take anything else for her mood.    Expected Outcomes Short: Start [facility] to help with mood. Long: Maintain a healthy mental state    Continue Psychosocial Services  Follow up required by staff             Psychosocial Re-Evaluation:   Psychosocial Discharge (Final Psychosocial Re-Evaluation):   Education: Education Goals: Education classes will be provided on a weekly basis, covering required topics. Participant will state understanding/return demonstration of topics presented.  Learning Barriers/Preferences:  Learning Barriers/Preferences - 02/14/24 1341       Learning Barriers/Preferences   Learning Barriers Sight    Learning Preferences None             General Pulmonary Education Topics:  Infection Prevention: - Provides verbal and written material to individual with discussion of infection control including proper hand washing and proper equipment cleaning during exercise session. Flowsheet Row Pulmonary Rehab from 02/14/2024 in [facility] Cardiac and Pulmonary Rehab  Date 02/14/24  Educator [facility]  Instruction Review Code 1- Verbalizes Understanding       Falls Prevention: - Provides verbal and written material to individual with discussion of falls prevention and safety. Flowsheet Row Pulmonary Rehab from 02/14/2024 in [facility] Cardiac and Pulmonary Rehab  Date 02/14/24  Educator [facility]  Instruction Review Code 1- Verbalizes Understanding       Chronic Lung Disease Review: - Group verbal instruction with posters, models, PowerPoint presentations and videos,  to review new updates, new respiratory medications, new advancements in procedures and treatments. Providing information on websites and "800" numbers for continued self-education. Includes information about supplement oxygen, available  portable oxygen systems, continuous and intermittent flow rates, oxygen safety, concentrators, and Medicare reimbursement for oxygen. Explanation of Pulmonary Drugs, including class, frequency, complications, importance of spacers, rinsing mouth after steroid MDI's, and proper cleaning methods for nebulizers. Review of basic lung anatomy and physiology related to function, structure, and complications of lung disease. Review of risk factors. Discussion about methods for diagnosing sleep apnea and types of masks and machines for OSA. Includes a review of the use of types of environmental controls: home humidity, furnaces, filters, dust mite/pet prevention, HEPA vacuums. Discussion about weather changes, air quality and the benefits of nasal washing. Instruction on Warning signs, infection symptoms, calling MD promptly, preventive modes, and value of vaccinations. Review of effective airway clearance, coughing and/or vibration techniques. Emphasizing that all should Create an Action Plan. Written material given at graduation. Flowsheet Row Pulmonary Rehab from 02/14/2024 in [facility] Cardiac and Pulmonary Rehab  Education need identified 02/14/24       AED/CPR: - Group verbal and written instruction with the use of models to demonstrate the basic use of the AED with the basic ABC's of resuscitation.    Anatomy and Cardiac Procedures: - Group verbal and visual presentation and models provide information about basic cardiac anatomy and function. Reviews the testing methods done to diagnose heart disease and the outcomes of the test results. Describes the treatment choices: Medical Management, Angioplasty, or Coronary Bypass Surgery for treating various heart conditions including Myocardial Infarction, Angina, Valve Disease, and Cardiac Arrhythmias.  Written material given at graduation.  Medication Safety: - Group verbal and visual instruction to review commonly prescribed medications for heart and lung  disease. Reviews the medication, class of the drug, and side effects. Includes the steps to properly store meds and maintain the prescription regimen.  Written material given at graduation.   Other: -Provides group and verbal instruction on various topics (see comments)   Knowledge Questionnaire Score:  Knowledge Questionnaire Score - 02/14/24 1453       Knowledge Questionnaire Score   Pre Score 14/18              Core Components/Risk Factors/Patient Goals at Admission:  Personal Goals and Risk Factors at Admission - 02/14/24 1342       Core Components/Risk Factors/Patient Goals on Admission    Weight Management Yes;Weight Loss    Intervention Weight Management: Develop a combined nutrition and exercise program designed to reach desired caloric intake, while maintaining appropriate intake of nutrient and fiber, sodium and fats, and appropriate energy expenditure required for the weight goal.;Weight Management: Provide education and appropriate resources to help participant work on and attain dietary goals.;Weight Management/Obesity: Establish reasonable short term and long term weight goals.    Expected Outcomes Short Term: Continue to assess and modify interventions until short term weight is achieved;Long Term: Adherence to nutrition and physical activity/exercise program aimed toward attainment of established weight goal;Weight Loss: Understanding of general recommendations for a balanced deficit meal plan, which promotes 1-2 lb weight loss per week and includes a negative energy balance of [phone] kcal/d;Understanding recommendations for meals to include 15-35% energy as protein, 25-35% energy from fat, 35-60% energy from carbohydrates, less than 200mg  of dietary cholesterol, 20-35 gm of total fiber daily;Understanding of distribution of calorie intake throughout the day with the consumption of 4-5 meals/snacks    Improve shortness of breath with ADL's Yes    Intervention Provide  education, individualized exercise plan and daily activity instruction to help decrease symptoms of SOB with activities of daily living.    Expected Outcomes Short Term: Improve cardiorespiratory fitness to achieve a reduction of symptoms when performing ADLs;Long Term: Be able to perform more ADLs without symptoms or delay the onset of symptoms             Education:Diabetes - Individual verbal and written instruction to review signs/symptoms of diabetes, desired ranges of glucose level fasting, after meals and with exercise. Acknowledge that pre and post exercise glucose checks will be done for 3 sessions at entry of program.   Know Your Numbers and Heart Failure: - Group verbal and visual instruction to discuss disease risk factors for cardiac and pulmonary disease and treatment options.  Reviews associated critical values for Overweight/Obesity, Hypertension, Cholesterol, and Diabetes.  Discusses basics of heart failure: signs/symptoms and treatments.  Introduces Heart Failure Zone chart for action plan for heart failure.  Written material given at graduation.   Core Components/Risk Factors/Patient Goals Review:    Core Components/Risk Factors/Patient Goals at Discharge (Final Review):    ITP Comments:  ITP Comments     Row Name 02/16/24 1107           ITP Comments First full day of exercise!  Patient was oriented to gym and equipment including functions, settings, policies, and procedures.  Patient's individual exercise prescription and treatment plan were reviewed.  All starting workloads were established based on the results of the 6 minute walk test done at initial orientation visit.  The plan for exercise progression was also introduced and progression will be  customized based on patient's performance and goals.                Comments: Initial ITP

## 2024-02-19 ENCOUNTER — Encounter: Admitting: *Deleted

## 2024-02-19 ENCOUNTER — Encounter

## 2024-02-19 DIAGNOSIS — R0609 Other forms of dyspnea: Secondary | ICD-10-CM

## 2024-02-19 NOTE — Progress Notes (Signed)
 Daily Session Note  Patient Details  Name: Michelle Acevedo MRN: [id number] Date of Birth: [birth year] Referring Provider:   Flowsheet Row Pulmonary Rehab from 02/16/2024 in [facility] Cardiac and Pulmonary Rehab  Referring Provider Dr. Derl Flemings       Encounter Date: 02/19/2024  Check In:  Session Check In - 02/19/24 1305       Check-In   Supervising physician immediately available to respond to emergencies See telemetry face sheet for immediately available ER MD    Location [facility]-Cardiac & Pulmonary Rehab    Staff Present Maud Sorenson, RN, BSN, CCRP;Meredith Manson Seitz RN,BSN;Maxon Dalmatia BS, Exercise Physiologist;Kelly Bollinger [facility]    Virtual Visit No    Medication changes reported     No    Fall or balance concerns reported    No    Warm-up and Cool-down Performed on first and last piece of equipment    Resistance Training Performed Yes    VAD Patient? No    PAD/SET Patient? No      Pain Assessment   Currently in Pain? No/denies                Social History   Tobacco Use  Smoking Status Former   Current packs/day: 1.50   Average packs/day: 1.5 packs/day for 30.0 years (45.0 ttl pk-yrs)   Types: Cigarettes  Smokeless Tobacco Never  Tobacco Comments   Quit in 2021.    Goals Met:  Proper associated with RPD/PD & O2 Sat Independence with exercise equipment Exercise tolerated well No report of concerns or symptoms today  Goals Unmet:  Not Applicable  Comments: Pt able to follow exercise prescription today without complaint.  Will continue to monitor for progression.    Dr. Firman Hughes is Medical Director for [facility] Cardiac Rehabilitation.  Dr. Fuad Aleskerov is Medical Director for [facility] Pulmonary Rehabilitation.

## 2024-02-20 ENCOUNTER — Telehealth: Payer: Self-pay | Admitting: Acute Care

## 2024-02-20 NOTE — Telephone Encounter (Signed)
 Patient called in and left VM. Called patient back. She is requesting a call back from Isa Manuel regarding pt's LDCT from 07/2023. Read as an LR2 but when [facility] read the scan the nodule was measured differently. Patient is scheduled for a biopsy on 7/8 as a single anesthetic event if path comes back positive. See phone note from 01/23/2024 for more information. Patient aware Dara Ear NP is in clinic all day today and likely will not be able to speak with Isa Manuel today but potentially tomorrow afternoon. Will forward to Sarah.

## 2024-02-21 ENCOUNTER — Encounter

## 2024-02-21 ENCOUNTER — Encounter
Admission: RE | Admit: 2024-02-21 | Discharge: 2024-02-21 | Disposition: A | Discharge status: Home / Self Care | Source: Ambulatory Visit | Attending: Internal Medicine | Admitting: Internal Medicine

## 2024-02-21 ENCOUNTER — Telehealth: Payer: Self-pay | Admitting: Acute Care

## 2024-02-21 DIAGNOSIS — R0609 Other forms of dyspnea: Secondary | ICD-10-CM | POA: Insufficient documentation

## 2024-02-21 LAB — NM MYOCAR MULTI W/SPECT W/WALL MOTION / EF
Estimated workload: 1
Exercise duration (min): 1 min
Exercise duration (sec): 0 s
MPHR: 145 {beats}/min
Nuc Stress EF: 71 %
Peak HR: 90 {beats}/min
Percent HR: 62 %
Rest HR: 75 {beats}/min
Rest Nuclear Isotope Dose: 10 mCi
SDS: 0
SRS: 5
SSS: 0
ST Depression (mm): 0 mm
Stress Nuclear Isotope Dose: 31.8 mCi
TID: 2.27

## 2024-02-21 MED ORDER — REGADENOSON 0.4 MG/5ML IV SOLN
0.4000 mg | Freq: Once | INTRAVENOUS | Status: AC
  Administered 2024-02-21: 0.4 mg via INTRAVENOUS

## 2024-02-21 MED ORDER — TECHNETIUM TC 99M TETROFOSMIN IV KIT
31.7800 | PACK | Freq: Once | INTRAVENOUS | Status: AC | PRN
Start: 2024-02-21 — End: 2024-02-21
  Administered 2024-02-21: 31.78 via INTRAVENOUS

## 2024-02-21 MED ORDER — TECHNETIUM TC 99M TETROFOSMIN IV KIT
10.0000 | PACK | Freq: Once | INTRAVENOUS | Status: AC | PRN
Start: 2024-02-21 — End: 2024-02-21
  Administered 2024-02-21: 10.03 via INTRAVENOUS

## 2024-02-21 NOTE — Telephone Encounter (Signed)
 I have returned the patient's call . She would like the radiologist who read the scan to call her and explain why the CT Chest done 07/19/2023 and read 08/17/2023 was interpreted differently by the radiologist at [facility] on over-read 01/2024. I have reviewed this information with her before, and she has called back for further explanation. As I did not read the scan, I cannot provide her the answer she is requesting. 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  I will forward a message to Dr. Garon Kaiser to see if he is willing to speak with the patient or the over reading radiologist. I did not provide her with a name or number for him.    Of note the over read was done to reassess esophageal varices in this patient who has had a liver transplant.   Another 20 minutes spent on the phone discussing this with the patient.   Dr. Garon Kaiser, please call me if you need a further explanation regarding this patient.

## 2024-02-23 ENCOUNTER — Encounter: Admitting: *Deleted

## 2024-02-23 DIAGNOSIS — R0609 Other forms of dyspnea: Secondary | ICD-10-CM

## 2024-02-23 NOTE — Progress Notes (Signed)
 Daily Session Note  Patient Details  Name: Michelle Acevedo MRN: [id number] Date of Birth: 05-14-1951 Referring Provider:   Flowsheet Row Pulmonary Rehab from 02/16/2024 in [facility] Cardiac and Pulmonary Rehab  Referring Provider Dr. Derl Flemings    Encounter Date: 02/23/2024  Check In:  Session Check In - 02/23/24 1137       Check-In   Supervising physician immediately available to respond to emergencies See telemetry face sheet for immediately available ER MD    Location [facility]-Cardiac & Pulmonary Rehab    Staff Present Maud Sorenson, RN, BSN, CCRP;Meredith Manson Seitz RN,BSN;Maxon [employer], Exercise Physiologist;Noah Tickle, BS, Exercise Physiologist    Virtual Visit No    Medication changes reported     No    Fall or balance concerns reported    No    Warm-up and Cool-down Performed on first and last piece of equipment    Resistance Training Performed Yes    VAD Patient? No    PAD/SET Patient? No      Pain Assessment   Currently in Pain? No/denies             Social History   Tobacco Use  Smoking Status Former   Current packs/day: 1.50   Average packs/day: 1.5 packs/day for 30.0 years (45.0 ttl pk-yrs)   Types: Cigarettes  Smokeless Tobacco Never  Tobacco Comments   Quit in 2021.    Goals Met:  Proper associated with RPD/PD & O2 Sat Independence with exercise equipment Exercise tolerated well No report of concerns or symptoms today  Goals Unmet:  Not Applicable  Comments: Pt able to follow exercise prescription today without complaint.  Will continue to monitor for progression.    Dr. Firman Hughes is Medical Director for [facility] Cardiac Rehabilitation.  Dr. Fuad Aleskerov is Medical Director for [facility] Pulmonary Rehabilitation.

## 2024-02-26 ENCOUNTER — Encounter

## 2024-02-28 ENCOUNTER — Encounter

## 2024-03-01 ENCOUNTER — Encounter

## 2024-03-04 ENCOUNTER — Encounter: Admitting: *Deleted

## 2024-03-04 DIAGNOSIS — R0609 Other forms of dyspnea: Secondary | ICD-10-CM

## 2024-03-04 NOTE — Progress Notes (Signed)
 Daily Session Note  Patient Details  Name: Michelle Acevedo MRN: [id number] Date of Birth: April 18, 1951 Referring Provider:   Flowsheet Row Pulmonary Rehab from 02/16/2024 in [facility] Cardiac and Pulmonary Rehab  Referring Provider Dr. Dickey Orem    Encounter Date: 03/04/2024  Check In:  Session Check In - 03/04/24 1122       Check-In   Supervising physician immediately available to respond to emergencies See telemetry face sheet for immediately available ER MD    Location [facility]-Cardiac & Pulmonary Rehab    Staff Present Othel Durand, RN, BSN, CCRP;Meredith Tressa RN,BSN;Kelly Liberty BS, ACSM CEP, Exercise Physiologist;Kelly Bollinger [facility]    Virtual Visit No    Medication changes reported     No    Fall or balance concerns reported    No    Warm-up and Cool-down Performed on first and last piece of equipment    Resistance Training Performed Yes    VAD Patient? No    PAD/SET Patient? No      Pain Assessment   Currently in Pain? No/denies             Social History   Tobacco Use  Smoking Status Former   Current packs/day: 1.50   Average packs/day: 1.5 packs/day for 30.0 years (45.0 ttl pk-yrs)   Types: Cigarettes  Smokeless Tobacco Never  Tobacco Comments   Quit in 2021.    Goals Met:  Proper associated with RPD/PD & O2 Sat Independence with exercise equipment Exercise tolerated well No report of concerns or symptoms today  Goals Unmet:  Not Applicable  Comments: Pt able to follow exercise prescription today without complaint.  Will continue to monitor for progression.    Dr. Oneil Pinal is Medical Director for [facility] Cardiac Rehabilitation.  Dr. Fuad Aleskerov is Medical Director for [facility] Pulmonary Rehabilitation.

## 2024-03-06 ENCOUNTER — Encounter

## 2024-03-06 DIAGNOSIS — R0609 Other forms of dyspnea: Secondary | ICD-10-CM

## 2024-03-06 NOTE — Progress Notes (Signed)
 Daily Session Note  Patient Details  Name: Michelle Acevedo MRN: [id number] Date of Birth: 06-08-1951 Referring Provider:   Flowsheet Row Pulmonary Rehab from 02/16/2024 in [facility] Cardiac and Pulmonary Rehab  Referring Provider Dr. Dickey Orem    Encounter Date: 03/06/2024  Check In:  Session Check In - 03/06/24 1057       Check-In   Supervising physician immediately available to respond to emergencies See telemetry face sheet for immediately available ER MD    Location [facility]-Cardiac & Pulmonary Rehab    Staff Present Fairy Plater RCP,RRT,BSRT;Tijuana Scheidegger RN,BSN,MPA;Margaret Best, MS, Exercise Physiologist;Jason Elnor RDN,LDN    Virtual Visit No    Medication changes reported     No    Fall or balance concerns reported    No    Tobacco Cessation No Change    Warm-up and Cool-down Performed on first and last piece of equipment    Resistance Training Performed Yes    VAD Patient? No    PAD/SET Patient? No      Pain Assessment   Currently in Pain? No/denies             Social History   Tobacco Use  Smoking Status Former   Current packs/day: 1.50   Average packs/day: 1.5 packs/day for 30.0 years (45.0 ttl pk-yrs)   Types: Cigarettes  Smokeless Tobacco Never  Tobacco Comments   Quit in 2021.    Goals Met:  Independence with exercise equipment Exercise tolerated well No report of concerns or symptoms today Strength training completed today  Goals Unmet:  Not Applicable  Comments: Pt able to follow exercise prescription today without complaint.  Will continue to monitor for progression.    Dr. Oneil Pinal is Medical Director for [facility] Cardiac Rehabilitation.  Dr. Fuad Aleskerov is Medical Director for [facility] Pulmonary Rehabilitation.

## 2024-03-08 ENCOUNTER — Encounter

## 2024-03-08 DIAGNOSIS — R0609 Other forms of dyspnea: Secondary | ICD-10-CM | POA: Diagnosis not present

## 2024-03-08 NOTE — Progress Notes (Signed)
 Daily Session Note  Patient Details  Name: Michelle Acevedo MRN: [id number] Date of Birth: [birth year] Referring Provider:   Flowsheet Row Pulmonary Rehab from 02/16/2024 in [facility] Cardiac and Pulmonary Rehab  Referring Provider Dr. Dickey Orem    Encounter Date: 03/08/2024  Check In:  Session Check In - 03/08/24 1057       Check-In   Supervising physician immediately available to respond to emergencies See telemetry face sheet for immediately available ER MD    Location [facility]-Cardiac & Pulmonary Rehab    Staff Present Devaughn Jaeger, BS, Exercise Physiologist;Cael Worth RN,BSN,MPA;Maxon Conetta BS, Exercise Physiologist;Joseph Rolinda RCP,RRT,BSRT    Virtual Visit No    Medication changes reported     No    Fall or balance concerns reported    No    Tobacco Cessation No Change    Warm-up and Cool-down Performed on first and last piece of equipment    Resistance Training Performed Yes    VAD Patient? No    PAD/SET Patient? No      Pain Assessment   Currently in Pain? No/denies             Social History   Tobacco Use  Smoking Status Former   Current packs/day: 1.50   Average packs/day: 1.5 packs/day for 30.0 years (45.0 ttl pk-yrs)   Types: Cigarettes  Smokeless Tobacco Never  Tobacco Comments   Quit in 2021.    Goals Met:  Independence with exercise equipment Exercise tolerated well No report of concerns or symptoms today Strength training completed today  Goals Unmet:  Not Applicable  Comments: Pt able to follow exercise prescription today without complaint.  Will continue to monitor for progression.    Dr. Oneil Pinal is Medical Director for [facility] Cardiac Rehabilitation.  Dr. Fuad Aleskerov is Medical Director for [facility] Pulmonary Rehabilitation.

## 2024-03-11 ENCOUNTER — Encounter

## 2024-03-13 ENCOUNTER — Encounter: Attending: Thoracic Surgery (Cardiothoracic Vascular Surgery)

## 2024-03-13 ENCOUNTER — Encounter: Payer: Self-pay | Admitting: *Deleted

## 2024-03-13 DIAGNOSIS — R0609 Other forms of dyspnea: Secondary | ICD-10-CM | POA: Diagnosis present

## 2024-03-13 NOTE — Progress Notes (Signed)
 Pulmonary Individual Treatment Plan  Patient Details  Name: Michelle Acevedo MRN: [id number] Date of Birth: Jun 11, 1951 Referring Provider:   Flowsheet Row Pulmonary Rehab from 02/16/2024 in [facility] Cardiac and Pulmonary Rehab  Referring Provider Dr. Dickey Orem    Initial Encounter Date:  Flowsheet Row Pulmonary Rehab from 02/16/2024 in [facility] Cardiac and Pulmonary Rehab  Date 02/16/24    Visit Diagnosis: DOE (dyspnea on exertion)  Patient's Home Medications on Admission:  Current Outpatient Medications:    acetaminophen  (TYLENOL ) 325 MG tablet, Take 325 mg by mouth., Disp: , Rfl:    acetaminophen  (TYLENOL ) 500 MG tablet, Take 500 mg by mouth every 6 (six) hours as needed for moderate pain or headache., Disp: , Rfl:    albuterol  (VENTOLIN  HFA) 108 (90 Base) MCG/ACT inhaler, Inhale 1-2 puffs into the lungs every 6 (six) hours as needed for wheezing or shortness of breath. (Patient not taking: Reported on 02/14/2024), Disp: 18 g, Rfl: 0   albuterol  (VENTOLIN  HFA) 108 (90 Base) MCG/ACT inhaler, Inhale 2 puffs into the lungs., Disp: , Rfl:    Albuterol  Sulfate (PROAIR  RESPICLICK) 108 (90 Base) MCG/ACT AEPB, Inhale 2 puffs into the lungs., Disp: , Rfl:    furosemide (LASIX) 40 MG tablet, Take 40 mg by mouth daily.  (Patient not taking: Reported on 02/14/2024), Disp: , Rfl:    Ipratropium-Albuterol  (COMBIVENT) 20-100 MCG/ACT AERS respimat, Inhale 2 puffs into the lungs 3 (three) times daily as needed for shortness of breath., Disp: , Rfl:    lactulose (CHRONULAC) 10 GM/15ML solution, Take 10 g by mouth 3 (three) times daily as needed (for elevated ammonia level)., Disp: , Rfl:    NON FORMULARY, B3, Disp: , Rfl:    oxyCODONE  (ROXICODONE ) 5 MG immediate release tablet, Take 1 tablet (5 mg total) by mouth every 8 (eight) hours as needed for up to 16 doses for breakthrough pain or severe pain. (Patient not taking: Reported on 02/14/2024), Disp: 16 tablet, Rfl: 0   pantoprazole (PROTONIX) 40 MG tablet, Take 40 mg  by mouth every morning.  (Patient not taking: Reported on 02/14/2024), Disp: , Rfl:    pantoprazole (PROTONIX) 40 MG tablet, Take 40 mg by mouth., Disp: , Rfl:    Potassium 99 MG TABS, Take 99 mg by mouth daily., Disp: , Rfl:    rifaximin (XIFAXAN) 550 MG TABS tablet, Take 550 mg by mouth every morning.  (Patient not taking: Reported on 02/14/2024), Disp: , Rfl:    spironolactone (ALDACTONE) 100 MG tablet, Take 100 mg by mouth daily. , Disp: , Rfl:    tacrolimus (PROGRAF) 5 MG capsule, Take 25 mg by mouth 2 (two) times daily., Disp: , Rfl:    traZODone (DESYREL) 50 MG tablet, Take 50 mg by mouth at bedtime., Disp: , Rfl:   Past Medical History: Past Medical History:  Diagnosis Date   Anemia    Arthritis    KNEE KLEFT   Cancer (HCC)    Labia   Chronic hepatitis C with cirrhosis (HCC)    Cirrhosis (HCC)    Emphysema of lung (HCC)    mild   GERD (gastroesophageal reflux disease)    Hepatitis C    History of esophageal varices    Hx of transfusion of whole blood    Macular degeneration    LEFT EYE-RECEIVES INJECTION IN LEFT EYE FOR THIS EVERY 9 WEEKS    Tobacco Use: Social History   Tobacco Use  Smoking Status Former   Current packs/day: 1.50   Average  packs/day: 1.5 packs/day for 30.0 years (45.0 ttl pk-yrs)   Types: Cigarettes  Smokeless Tobacco Never  Tobacco Comments   Quit in 2021.    Labs: Review Flowsheet        No data to display           Pulmonary Assessment Scores:  Pulmonary Assessment Scores     Row Name 02/14/24 1352 02/14/24 1450       ADL UCSD   ADL Phase Entry Entry    SOB Score total -- 3    Rest -- 0    Walk -- 0    Stairs -- 3    Bath -- 0    Dress -- 0    Shop -- 0      CAT Score   CAT Score 21 --       UCSD: Self-administered rating of dyspnea associated with activities of daily living (ADLs) 6-point scale (0 = not at all to 5 = maximal or unable to do because of breathlessness)  Scoring Scores range from 0 to 120.   Minimally important difference is 5 units  CAT: CAT can identify the health impairment of COPD patients and is better correlated with disease progression.  CAT has a scoring range of zero to 40. The CAT score is classified into four groups of low (less than 10), medium (10 - 20), high (21-30) and very high (31-40) based on the impact level of disease on health status. A CAT score over 10 suggests significant symptoms.  A worsening CAT score could be explained by an exacerbation, poor medication adherence, poor inhaler technique, or progression of COPD or comorbid conditions.  CAT MCID is 2 points  mMRC: mMRC (Modified Medical Research Council) Dyspnea Scale is used to assess the degree of baseline functional disability in patients of respiratory disease due to dyspnea. No minimal important difference is established. A decrease in score of 1 point or greater is considered a positive change.   Pulmonary Function Assessment:  Pulmonary Function Assessment - 02/14/24 1341       Breath   Shortness of Breath Yes;Limiting activity          Exercise Target Goals: Exercise Program Goal: Individual exercise prescription set using results from initial 6 min walk test and THRR while considering  patient's activity barriers and safety.   Exercise Prescription Goal: Initial exercise prescription builds to 30-45 minutes a day of aerobic activity, 2-3 days per week.  Home exercise guidelines will be given to patient during program as part of exercise prescription that the participant will acknowledge.  Education: Aerobic Exercise: - Group verbal and visual presentation on the components of exercise prescription. Introduces F.I.T.T principle from ACSM for exercise prescriptions.  Reviews F.I.T.T. principles of aerobic exercise including progression. Written material given at graduation.   Education: Resistance Exercise: - Group verbal and visual presentation on the components of exercise  prescription. Introduces F.I.T.T principle from ACSM for exercise prescriptions  Reviews F.I.T.T. principles of resistance exercise including progression. Written material given at graduation.    Education: Exercise & Equipment Safety: - Individual verbal instruction and demonstration of equipment use and safety with use of the equipment. Flowsheet Row Pulmonary Rehab from 02/14/2024 in [facility] Cardiac and Pulmonary Rehab  Date 02/14/24  Educator [facility]  Instruction Review Code 1- Verbalizes Understanding    Education: Exercise Physiology & General Exercise Guidelines: - Group verbal and written instruction with models to review the exercise physiology of the cardiovascular system and associated critical  values. Provides general exercise guidelines with specific guidelines to those with heart or lung disease.  Flowsheet Row Pulmonary Rehab from 02/14/2024 in [facility] Cardiac and Pulmonary Rehab  Education need identified 02/14/24    Education: Flexibility, Balance, Mind/Body Relaxation: - Group verbal and visual presentation with interactive activity on the components of exercise prescription. Introduces F.I.T.T principle from ACSM for exercise prescriptions. Reviews F.I.T.T. principles of flexibility and balance exercise training including progression. Also discusses the mind body connection.  Reviews various relaxation techniques to help reduce and manage stress (i.e. Deep breathing, progressive muscle relaxation, and visualization). Balance handout provided to take home. Written material given at graduation.   Activity Barriers & Risk Stratification:  Activity Barriers & Cardiac Risk Stratification - 02/16/24 1121       Activity Barriers & Cardiac Risk Stratification   Activity Barriers Joint Problems;Shortness of Breath          6 Minute Walk:  6 Minute Walk     Row Name 02/16/24 1116         6 Minute Walk   Phase Initial     Distance 1480 feet     Walk Time 6 minutes     # of Rest  Breaks 0     MPH 2.8     METS 2.9     RPE 9     Perceived Dyspnea  0     VO2 Peak 10.8     Symptoms No     Resting HR 86 bpm     Resting BP 128/60     Resting Oxygen Saturation  95 %     Exercise Oxygen Saturation  during 6 min walk 92 %     Max Ex. HR 115 bpm     Max Ex. BP 144/68     2 Minute Post BP 124/62       Interval HR   1 Minute HR 100     2 Minute HR 115     3 Minute HR 115     4 Minute HR 71     5 Minute HR 115     6 Minute HR 111     2 Minute Post HR 89     Interval Heart Rate? Yes       Interval Oxygen   Interval Oxygen? Yes     Baseline Oxygen Saturation % 95 %     1 Minute Oxygen Saturation % 95 %     1 Minute Liters of Oxygen 0 L     2 Minute Oxygen Saturation % 98 %     2 Minute Liters of Oxygen 0 L     3 Minute Oxygen Saturation % 93 %     3 Minute Liters of Oxygen 0 L     4 Minute Oxygen Saturation % 92 %     4 Minute Liters of Oxygen 0 L     5 Minute Oxygen Saturation % 97 %     5 Minute Liters of Oxygen 0 L     6 Minute Oxygen Saturation % 97 %     6 Minute Liters of Oxygen 0 L     2 Minute Post Oxygen Saturation % 98 %     2 Minute Post Liters of Oxygen 0 L       Oxygen Initial Assessment:  Oxygen Initial Assessment - 02/14/24 1341       Home Oxygen   Home Oxygen Device None    Sleep  Oxygen Prescription None    Home Exercise Oxygen Prescription None    Home Resting Oxygen Prescription None      Initial 6 min Walk   Oxygen Used None      Program Oxygen Prescription   Program Oxygen Prescription None      Intervention   Short Term Goals To learn and exhibit compliance with exercise, home and travel O2 prescription;To learn and understand importance of monitoring SPO2 with pulse oximeter and demonstrate accurate use of the pulse oximeter.;To learn and understand importance of maintaining oxygen saturations>88%;To learn and demonstrate proper pursed lip breathing techniques or other breathing techniques. ;To learn and demonstrate proper  use of respiratory medications    Long  Term Goals Exhibits compliance with exercise, home  and travel O2 prescription;Verbalizes importance of monitoring SPO2 with pulse oximeter and return demonstration;Maintenance of O2 saturations>88%;Exhibits proper breathing techniques, such as pursed lip breathing or other method taught during program session;Compliance with respiratory medication;Demonstrates proper use of MDI's          Oxygen Re-Evaluation:  Oxygen Re-Evaluation     Row Name 02/16/24 1108 03/08/24 1116           Program Oxygen Prescription   Program Oxygen Prescription None None        Home Oxygen   Home Oxygen Device None None      Sleep Oxygen Prescription None None      Home Exercise Oxygen Prescription None None      Home Resting Oxygen Prescription None None        Goals/Expected Outcomes   Short Term Goals To learn and exhibit compliance with exercise, home and travel O2 prescription;To learn and understand importance of monitoring SPO2 with pulse oximeter and demonstrate accurate use of the pulse oximeter.;To learn and understand importance of maintaining oxygen saturations>88%;To learn and demonstrate proper pursed lip breathing techniques or other breathing techniques. ;To learn and demonstrate proper use of respiratory medications To learn and demonstrate proper pursed lip breathing techniques or other breathing techniques.       Long  Term Goals Exhibits compliance with exercise, home  and travel O2 prescription;Verbalizes importance of monitoring SPO2 with pulse oximeter and return demonstration;Maintenance of O2 saturations>88%;Exhibits proper breathing techniques, such as pursed lip breathing or other method taught during program session;Compliance with respiratory medication;Demonstrates proper use of MDI's Exhibits proper breathing techniques, such as pursed lip breathing or other method taught during program session      Comments Reviewed PLB technique with pt.   Talked about how it works and it's importance in maintaining their exercise saturations. Informed patient how to perform the Pursed Lipped breathing technique. Told patient to Inhale through the nose and out the mouth with pursed lips to keep their airways open, help oxygenate them better, practice when at rest or doing strenuous activity. Patient Verbalizes understanding of technique and will work on and be reiterated during [facility].      Goals/Expected Outcomes Short: Become more profiecient at using PLB. Long: Become independent at using PLB. Short: use PLB with exertion. Long: use PLB on exertion proficiently and independently.         Oxygen Discharge (Final Oxygen Re-Evaluation):  Oxygen Re-Evaluation - 03/08/24 1116       Program Oxygen Prescription   Program Oxygen Prescription None      Home Oxygen   Home Oxygen Device None    Sleep Oxygen Prescription None    Home Exercise Oxygen Prescription None    Home Resting  Oxygen Prescription None      Goals/Expected Outcomes   Short Term Goals To learn and demonstrate proper pursed lip breathing techniques or other breathing techniques.     Long  Term Goals Exhibits proper breathing techniques, such as pursed lip breathing or other method taught during program session    Comments Informed patient how to perform the Pursed Lipped breathing technique. Told patient to Inhale through the nose and out the mouth with pursed lips to keep their airways open, help oxygenate them better, practice when at rest or doing strenuous activity. Patient Verbalizes understanding of technique and will work on and be reiterated during [facility].    Goals/Expected Outcomes Short: use PLB with exertion. Long: use PLB on exertion proficiently and independently.          Initial Exercise Prescription:  Initial Exercise Prescription - 02/16/24 1100       Date of Initial Exercise RX and Referring Provider   Date 02/16/24    Referring Provider Dr. Dickey Orem      Oxygen   Maintain Oxygen Saturation 88% or higher      Treadmill   MPH 2.8    Grade 0    Minutes 15    METs 2.9      Recumbant Bike   Level 3    RPM 50    Watts 25    Minutes 15    METs 2.9      NuStep   Level 3    SPM 80    Minutes 15    METs 2.9      Rower   Level 1    Watts 25    Minutes 15    METs 2.9      Intensity   THRR 40-80% of Max Heartrate 109-133    Ratings of Perceived Exertion 11-13    Perceived Dyspnea 0-4      Resistance Training   Training Prescription Yes    Weight 3lb    Reps 10-15          Perform Capillary Blood Glucose checks as needed.  Exercise Prescription Changes:   Exercise Prescription Changes     Row Name 02/16/24 1100 02/28/24 1400 03/12/24 1300         Response to Exercise   Blood Pressure (Admit) 128/60 118/72 134/60     Blood Pressure (Exercise) 144/68 136/72 146/60     Blood Pressure (Exit) 124/62 120/68 118/62     Heart Rate (Admit) 86 bpm 79 bpm 85 bpm     Heart Rate (Exercise) 115 bpm 103 bpm 108 bpm     Heart Rate (Exit) 89 bpm 91 bpm 97 bpm     Oxygen Saturation (Admit) 95 % 94 % 96 %     Oxygen Saturation (Exercise) 92 % 93 % 90 %     Oxygen Saturation (Exit) 98 % 96 % 94 %     Rating of Perceived Exertion (Exercise) 9 11 12      Perceived Dyspnea (Exercise) 0 0 1     Symptoms R knee pain none none     Comments results first 2 weeks of exercise --     Duration -- Progress to 30 minutes of  aerobic without signs/symptoms of physical distress Continue with 30 min of aerobic exercise without signs/symptoms of physical distress.     Intensity -- THRR unchanged THRR unchanged       Progression   Progression -- Continue to progress workloads to  maintain intensity without signs/symptoms of physical distress. Continue to progress workloads to maintain intensity without signs/symptoms of physical distress.     Average METs -- 2.89 2.94       Resistance Training   Training Prescription -- Yes Yes      Weight -- 3 4 lb     Reps -- 10-15 10-15       Interval Training   Interval Training -- No No       Treadmill   MPH -- 2.3 2.5     Grade -- 0 0     Minutes -- 15 15     METs -- 2.76 2.91       Recumbant Bike   Level -- 2 3     Watts -- 25 30     Minutes -- 15 15     METs -- 3.04 3.03       NuStep   Level -- -- 1  T6 nustep: level 2     Minutes -- -- 15     METs -- -- --  T6 nustep: 3.3 mets       Biostep-RELP   Level -- 1 --     Minutes -- 15 --     METs -- 3 --       Oxygen   Maintain Oxygen Saturation -- 88% or higher 88% or higher        Exercise Comments:   Exercise Comments     Row Name 02/16/24 1107           Exercise Comments First full day of exercise!  Patient was oriented to gym and equipment including functions, settings, policies, and procedures.  Patient's individual exercise prescription and treatment plan were reviewed.  All starting workloads were established based on the results of the 6 minute walk test done at initial orientation visit.  The plan for exercise progression was also introduced and progression will be customized based on patient's performance and goals.          Exercise Goals and Review:   Exercise Goals     Row Name 02/16/24 1130             Exercise Goals   Increase Physical Activity Yes       Intervention Provide advice, education, support and counseling about physical activity/exercise needs.;Develop an individualized exercise prescription for aerobic and resistive training based on initial evaluation findings, risk stratification, comorbidities and participant's personal goals.       Expected Outcomes Short Term: Attend rehab on a regular basis to increase amount of physical activity.;Long Term: Add in home exercise to make exercise part of routine and to increase amount of physical activity.;Long Term: Exercising regularly at least 3-5 days a week.       Increase Strength and Stamina Yes       Intervention Provide  advice, education, support and counseling about physical activity/exercise needs.;Develop an individualized exercise prescription for aerobic and resistive training based on initial evaluation findings, risk stratification, comorbidities and participant's personal goals.       Expected Outcomes Short Term: Increase workloads from initial exercise prescription for resistance, speed, and METs.;Short Term: Perform resistance training exercises routinely during rehab and add in resistance training at home;Long Term: Improve cardiorespiratory fitness, muscular endurance and strength as measured by increased METs and functional capacity ( )       Able to understand and use rate of perceived exertion (RPE) scale Yes  Intervention Provide education and explanation on how to use RPE scale       Expected Outcomes Short Term: Able to use RPE daily in rehab to express subjective intensity level;Long Term:  Able to use RPE to guide intensity level when exercising independently       Able to understand and use Dyspnea scale Yes       Intervention Provide education and explanation on how to use Dyspnea scale       Expected Outcomes Short Term: Able to use Dyspnea scale daily in rehab to express subjective sense of shortness of breath during exertion;Long Term: Able to use Dyspnea scale to guide intensity level when exercising independently       Knowledge and understanding of Target Heart Rate Range (THRR) Yes       Intervention Provide education and explanation of THRR including how the numbers were predicted and where they are located for reference       Expected Outcomes Short Term: Able to state/look up THRR;Short Term: Able to use daily as guideline for intensity in rehab;Long Term: Able to use THRR to govern intensity when exercising independently       Able to check pulse independently Yes       Intervention Provide education and demonstration on how to check pulse in carotid and radial arteries.;Review  the importance of being able to check your own pulse for safety during independent exercise       Expected Outcomes Short Term: Able to explain why pulse checking is important during independent exercise;Long Term: Able to check pulse independently and accurately       Understanding of Exercise Prescription Yes       Intervention Provide education, explanation, and written materials on patient's individual exercise prescription       Expected Outcomes Short Term: Able to explain program exercise prescription;Long Term: Able to explain home exercise prescription to exercise independently          Exercise Goals Re-Evaluation :  Exercise Goals Re-Evaluation     Row Name 02/16/24 1107 02/28/24 1449 03/12/24 1359         Exercise Goal Re-Evaluation   Exercise Goals Review Able to understand and use rate of perceived exertion (RPE) scale;Knowledge and understanding of Target Heart Rate Range (THRR);Understanding of Exercise Prescription;Increase Physical Activity;Increase Strength and Stamina;Able to understand and use Dyspnea scale;Able to check pulse independently Increase Strength and Stamina;Increase Physical Activity;Understanding of Exercise Prescription Increase Strength and Stamina;Increase Physical Activity;Understanding of Exercise Prescription     Comments Reviewed RPE and dyspnea scale, THR and program prescription with pt today.  Pt voiced understanding and was given a copy of goals to take home. Michelle Acevedo is off to a good start in the program. She was able to attend her first 2 sessions during this review period. During her sessions she was able to use the treadmill at a speed of 2. and no incline, and the recumbent bike at level 2. We will continue to monitor her progress in the program. Michelle Acevedo is doing well in rehab. She increased her workload on the treadmill to a speed of 2.5 mph with no incline. She also improved to level 3 on the recumbent bike and increased to 4 lb handweights for  resistance training. We will continue to monitor her progress in the program.     Expected Outcomes Short: Use RPE daily to regulate intensity. Long: Follow program prescription in THR. Short: Continue to follow exercise prescription. Long: Continue exercise to  improve strength and stamina. Short: Continue to progressively increase treadmill workload. Long: Continue exercise to improve strength and stamina.        Discharge Exercise Prescription (Final Exercise Prescription Changes):  Exercise Prescription Changes - 03/12/24 1300       Response to Exercise   Blood Pressure (Admit) 134/60    Blood Pressure (Exercise) 146/60    Blood Pressure (Exit) 118/62    Heart Rate (Admit) 85 bpm    Heart Rate (Exercise) 108 bpm    Heart Rate (Exit) 97 bpm    Oxygen Saturation (Admit) 96 %    Oxygen Saturation (Exercise) 90 %    Oxygen Saturation (Exit) 94 %    Rating of Perceived Exertion (Exercise) 12    Perceived Dyspnea (Exercise) 1    Symptoms none    Duration Continue with 30 min of aerobic exercise without signs/symptoms of physical distress.    Intensity THRR unchanged      Progression   Progression Continue to progress workloads to maintain intensity without signs/symptoms of physical distress.    Average METs 2.94      Resistance Training   Training Prescription Yes    Weight 4 lb    Reps 10-15      Interval Training   Interval Training No      Treadmill   MPH 2.5    Grade 0    Minutes 15    METs 2.91      Recumbant Bike   Level 3    Watts 30    Minutes 15    METs 3.03      NuStep   Level 1   T6 nustep: level 2   Minutes 15    METs --   T6 nustep: 3.3 mets     Oxygen   Maintain Oxygen Saturation 88% or higher          Nutrition:  Target Goals: Understanding of nutrition guidelines, daily intake of sodium 1500mg , cholesterol 200mg , calories 30% from fat and 7% or less from saturated fats, daily to have 5 or more servings of fruits and  vegetables.  Education: All About Nutrition: -Group instruction provided by verbal, written material, interactive activities, discussions, models, and posters to present general guidelines for heart healthy nutrition including fat, fiber, MyPlate, the role of sodium in heart healthy nutrition, utilization of the nutrition label, and utilization of this knowledge for meal planning. Follow up email sent as well. Written material given at graduation.   Biometrics:  Pre Biometrics - 02/16/24 1131       Pre Biometrics   Height 5' 1.5 (1.562 m)    Weight 166 lb 1.6 oz (75.3 kg)    Waist Circumference 39 inches    Hip Circumference 45.5 inches    Waist to Hip Ratio 0.86 %    BMI (Calculated) 30.88    Single Leg Stand 3.5 seconds           Nutrition Therapy Plan and Nutrition Goals:   Nutrition Assessments:  MEDIFICTS Score Key: >=70 Need to make dietary changes  40-70 Heart Healthy Diet <= 40 Therapeutic Level Cholesterol Diet  Flowsheet Row Pulmonary Rehab from 02/16/2024 in [facility] Cardiac and Pulmonary Rehab  Picture Your Plate Total Score on Admission 50   Picture Your Plate Scores: <59 Unhealthy dietary pattern with much room for improvement. 41-50 Dietary pattern unlikely to meet recommendations for good health and room for improvement. 51-60 More healthful dietary pattern, with some room for improvement.  >  60 Healthy dietary pattern, although there may be some specific behaviors that could be improved.   Nutrition Goals Re-Evaluation:  Nutrition Goals Re-Evaluation     Row Name 03/08/24 1119             Goals   Comment Patient was informed on why it is important to maintain a balanced diet when dealing with Respiratory issues. Explained that it takes a lot of energy to breath and when they are short of breath often they will need to have a good diet to help keep up with the calories they are expending for breathing.       Expected Outcome Short: Choose and plan  snacks accordingly to patients caloric intake to improve breathing. Long: Maintain a diet independently that meets their caloric intake to aid in daily shortness of breath.          Nutrition Goals Discharge (Final Nutrition Goals Re-Evaluation):  Nutrition Goals Re-Evaluation - 03/08/24 1119       Goals   Comment Patient was informed on why it is important to maintain a balanced diet when dealing with Respiratory issues. Explained that it takes a lot of energy to breath and when they are short of breath often they will need to have a good diet to help keep up with the calories they are expending for breathing.    Expected Outcome Short: Choose and plan snacks accordingly to patients caloric intake to improve breathing. Long: Maintain a diet independently that meets their caloric intake to aid in daily shortness of breath.          Psychosocial: Target Goals: Acknowledge presence or absence of significant depression and/or stress, maximize coping skills, provide positive support system. Participant is able to verbalize types and ability to use techniques and skills needed for reducing stress and depression.   Education: Stress, Anxiety, and Depression - Group verbal and visual presentation to define topics covered.  Reviews how body is impacted by stress, anxiety, and depression.  Also discusses healthy ways to reduce stress and to treat/manage anxiety and depression.  Written material given at graduation.   Education: Sleep Hygiene -Provides group verbal and written instruction about how sleep can affect your health.  Define sleep hygiene, discuss sleep cycles and impact of sleep habits. Review good sleep hygiene tips.    Initial Review & Psychosocial Screening:  Initial Psych Review & Screening - 02/14/24 1348       Initial Review   Current issues with Current Sleep Concerns      Family Dynamics   Good Support System? Yes    Comments Michelle Acevedo can look to her sisters, boyfriend,  niece and friends for support. She takes medication for sleep and does not take anything else for her mood.      Barriers   Psychosocial barriers to participate in program The patient should benefit from training in stress management and relaxation.      Screening Interventions   Interventions To provide support and resources with identified psychosocial needs;Encouraged to exercise;Provide feedback about the scores to participant    Expected Outcomes Short Term goal: Utilizing psychosocial counselor, staff and physician to assist with identification of specific Stressors or current issues interfering with healing process. Setting desired goal for each stressor or current issue identified.;Long Term Goal: Stressors or current issues are controlled or eliminated.;Short Term goal: Identification and review with participant of any Quality of Life or Depression concerns found by scoring the questionnaire.;Long Term goal: The participant improves quality  of Life and PHQ9 Scores as seen by post scores and/or verbalization of changes          Quality of Life Scores:  Scores of 19 and below usually indicate a poorer quality of life in these areas.  A difference of  2-3 points is a clinically meaningful difference.  A difference of 2-3 points in the total score of the Quality of Life Index has been associated with significant improvement in overall quality of life, self-image, physical symptoms, and general health in studies assessing change in quality of life.  PHQ-9: Review Flowsheet       03/08/2024 02/14/2024  Depression screen PHQ 2/9  Decreased Interest 0 0 0  Down, Depressed, Hopeless 0 0 0  PHQ - 2 Score 0 0 0  Altered sleeping 3 3 3   Tired, decreased energy 1 1 1   Change in appetite 1 1 1   Feeling bad or failure about yourself  0 0 0  Trouble concentrating 0 0 0  Moving slowly or fidgety/restless 0 0 0  Suicidal thoughts 0 0 0  PHQ-9 Score 5 5 5   Difficult doing work/chores Not  difficult at all Not difficult at all Not difficult at all    Details       Multiple values from one day are sorted in reverse-chronological order        Interpretation of Total Score  Total Score Depression Severity:  1-4 = Minimal depression, 5-9 = Mild depression, 10-14 = Moderate depression, 15-19 = Moderately severe depression, 20-27 = Severe depression   Psychosocial Evaluation and Intervention:  Psychosocial Evaluation - 02/14/24 1349       Psychosocial Evaluation & Interventions   Interventions Relaxation education;Encouraged to exercise with the program and follow exercise prescription;Stress management education    Comments Michelle Acevedo can look to her sisters, boyfriend, niece and friends for support. She takes medication for sleep and does not take anything else for her mood.    Expected Outcomes Short: Start [facility] to help with mood. Long: Maintain a healthy mental state    Continue Psychosocial Services  Follow up required by staff          Psychosocial Re-Evaluation:  Psychosocial Re-Evaluation     Row Name 03/08/24 1120             Psychosocial Re-Evaluation   Current issues with Current Sleep Concerns       Comments Reviewed patient health questionnaire (PHQ-9) with patient for follow up. Previously, patients score indicated signs/symptoms of depression.  Reviewed to see if patient is improving symptom wise while in program.  Score stayed the same and patient states that it is because she has trouble sleeping. .       Expected Outcomes Short: Continue to attend [facility] regularly for regular exercise and social engagement. Long: Continue to improve symptoms and manage a positive mental state.       Interventions Encouraged to attend Pulmonary Rehabilitation for the exercise       Continue Psychosocial Services  Follow up required by staff          Psychosocial Discharge (Final Psychosocial Re-Evaluation):  Psychosocial Re-Evaluation - 03/08/24 1120        Psychosocial Re-Evaluation   Current issues with Current Sleep Concerns    Comments Reviewed patient health questionnaire (PHQ-9) with patient for follow up. Previously, patients score indicated signs/symptoms of depression.  Reviewed to see if patient is improving symptom wise while in program.  Score stayed the same and  patient states that it is because she has trouble sleeping. .    Expected Outcomes Short: Continue to attend [facility] regularly for regular exercise and social engagement. Long: Continue to improve symptoms and manage a positive mental state.    Interventions Encouraged to attend Pulmonary Rehabilitation for the exercise    Continue Psychosocial Services  Follow up required by staff          Education: Education Goals: Education classes will be provided on a weekly basis, covering required topics. Participant will state understanding/return demonstration of topics presented.  Learning Barriers/Preferences:  Learning Barriers/Preferences - 02/14/24 1341       Learning Barriers/Preferences   Learning Barriers Sight    Learning Preferences None          General Pulmonary Education Topics:  Infection Prevention: - Provides verbal and written material to individual with discussion of infection control including proper hand washing and proper equipment cleaning during exercise session. Flowsheet Row Pulmonary Rehab from 02/14/2024 in [facility] Cardiac and Pulmonary Rehab  Date 02/14/24  Educator [facility]  Instruction Review Code 1- Verbalizes Understanding    Falls Prevention: - Provides verbal and written material to individual with discussion of falls prevention and safety. Flowsheet Row Pulmonary Rehab from 02/14/2024 in [facility] Cardiac and Pulmonary Rehab  Date 02/14/24  Educator [facility]  Instruction Review Code 1- Verbalizes Understanding    Chronic Lung Disease Review: - Group verbal instruction with posters, models, PowerPoint presentations and videos,  to review new  updates, new respiratory medications, new advancements in procedures and treatments. Providing information on websites and 800 numbers for continued self-education. Includes information about supplement oxygen, available portable oxygen systems, continuous and intermittent flow rates, oxygen safety, concentrators, and Medicare reimbursement for oxygen. Explanation of Pulmonary Drugs, including class, frequency, complications, importance of spacers, rinsing mouth after steroid MDI's, and proper cleaning methods for nebulizers. Review of basic lung anatomy and physiology related to function, structure, and complications of lung disease. Review of risk factors. Discussion about methods for diagnosing sleep apnea and types of masks and machines for OSA. Includes a review of the use of types of environmental controls: home humidity, furnaces, filters, dust mite/pet prevention, HEPA vacuums. Discussion about weather changes, air quality and the benefits of nasal washing. Instruction on Warning signs, infection symptoms, calling MD promptly, preventive modes, and value of vaccinations. Review of effective airway clearance, coughing and/or vibration techniques. Emphasizing that all should Create an Action Plan. Written material given at graduation. Flowsheet Row Pulmonary Rehab from 02/14/2024 in [facility] Cardiac and Pulmonary Rehab  Education need identified 02/14/24    AED/CPR: - Group verbal and written instruction with the use of models to demonstrate the basic use of the AED with the basic ABC's of resuscitation.    Anatomy and Cardiac Procedures: - Group verbal and visual presentation and models provide information about basic cardiac anatomy and function. Reviews the testing methods done to diagnose heart disease and the outcomes of the test results. Describes the treatment choices: Medical Management, Angioplasty, or Coronary Bypass Surgery for treating various heart conditions including Myocardial  Infarction, Angina, Valve Disease, and Cardiac Arrhythmias.  Written material given at graduation.   Medication Safety: - Group verbal and visual instruction to review commonly prescribed medications for heart and lung disease. Reviews the medication, class of the drug, and side effects. Includes the steps to properly store meds and maintain the prescription regimen.  Written material given at graduation.   Other: -Provides group and verbal instruction on various topics (  see comments)   Knowledge Questionnaire Score:  Knowledge Questionnaire Score - 02/14/24 1453       Knowledge Questionnaire Score   Pre Score 14/18           Core Components/Risk Factors/Patient Goals at Admission:  Personal Goals and Risk Factors at Admission - 02/14/24 1342       Core Components/Risk Factors/Patient Goals on Admission    Weight Management Yes;Weight Loss    Intervention Weight Management: Develop a combined nutrition and exercise program designed to reach desired caloric intake, while maintaining appropriate intake of nutrient and fiber, sodium and fats, and appropriate energy expenditure required for the weight goal.;Weight Management: Provide education and appropriate resources to help participant work on and attain dietary goals.;Weight Management/Obesity: Establish reasonable short term and long term weight goals.    Expected Outcomes Short Term: Continue to assess and modify interventions until short term weight is achieved;Long Term: Adherence to nutrition and physical activity/exercise program aimed toward attainment of established weight goal;Weight Loss: Understanding of general recommendations for a balanced deficit meal plan, which promotes 1-2 lb weight loss per week and includes a negative energy balance of [phone] kcal/d;Understanding recommendations for meals to include 15-35% energy as protein, 25-35% energy from fat, 35-60% energy from carbohydrates, less than 200mg  of dietary  cholesterol, 20-35 gm of total fiber daily;Understanding of distribution of calorie intake throughout the day with the consumption of 4-5 meals/snacks    Improve shortness of breath with ADL's Yes    Intervention Provide education, individualized exercise plan and daily activity instruction to help decrease symptoms of SOB with activities of daily living.    Expected Outcomes Short Term: Improve cardiorespiratory fitness to achieve a reduction of symptoms when performing ADLs;Long Term: Be able to perform more ADLs without symptoms or delay the onset of symptoms          Education:Diabetes - Individual verbal and written instruction to review signs/symptoms of diabetes, desired ranges of glucose level fasting, after meals and with exercise. Acknowledge that pre and post exercise glucose checks will be done for 3 sessions at entry of program.   Know Your Numbers and Heart Failure: - Group verbal and visual instruction to discuss disease risk factors for cardiac and pulmonary disease and treatment options.  Reviews associated critical values for Overweight/Obesity, Hypertension, Cholesterol, and Diabetes.  Discusses basics of heart failure: signs/symptoms and treatments.  Introduces Heart Failure Zone chart for action plan for heart failure.  Written material given at graduation.   Core Components/Risk Factors/Patient Goals Review:   Goals and Risk Factor Review     Row Name 03/08/24 1118             Core Components/Risk Factors/Patient Goals Review   Personal Goals Review Improve shortness of breath with ADL's       Review Spoke to patient about their shortness of breath and what they can do to improve. Patient has been informed of breathing techniques when starting the program. Patient is informed to tell staff if they have had any med changes and that certain meds they are taking or not taking can be causing shortness of breath.       Expected Outcomes Short: Attend [facility] regularly  to improve shortness of breath with ADL's. Long: maintain independence with ADL's          Core Components/Risk Factors/Patient Goals at Discharge (Final Review):   Goals and Risk Factor Review - 03/08/24 1118       Core Components/Risk Factors/Patient  Goals Review   Personal Goals Review Improve shortness of breath with ADL's    Review Spoke to patient about their shortness of breath and what they can do to improve. Patient has been informed of breathing techniques when starting the program. Patient is informed to tell staff if they have had any med changes and that certain meds they are taking or not taking can be causing shortness of breath.    Expected Outcomes Short: Attend [facility] regularly to improve shortness of breath with ADL's. Long: maintain independence with ADL's          ITP Comments:  ITP Comments     Row Name 02/16/24 1107 03/13/24 1100         ITP Comments First full day of exercise!  Patient was oriented to gym and equipment including functions, settings, policies, and procedures.  Patient's individual exercise prescription and treatment plan were reviewed.  All starting workloads were established based on the results of the 6 minute walk test done at initial orientation visit.  The plan for exercise progression was also introduced and progression will be customized based on patient's performance and goals. 30 Day review completed. Medical Director ITP review done, changes made as directed, and signed approval by Medical Director.         Comments: 30 day review

## 2024-03-13 NOTE — Progress Notes (Signed)
 Daily Session Note  Patient Details  Name: Michelle Acevedo MRN: [id number] Date of Birth: 10-21-1950 Referring Provider:   Flowsheet Row Pulmonary Rehab from 02/16/2024 in [facility] Cardiac and Pulmonary Rehab  Referring Provider Dr. Dickey Orem    Encounter Date: 03/13/2024  Check In:  Session Check In - 03/13/24 1059       Check-In   Supervising physician immediately available to respond to emergencies See telemetry face sheet for immediately available ER MD    Location [facility]-Cardiac & Pulmonary Rehab    Staff Present Rollene Paterson, MS, Exercise Physiologist;Jason Elnor RDN,LDN;Susanne Bice, RN, BSN, CCRP;Priyah Schmuck RN,BSN,MPA;[employer]    Virtual Visit No    Medication changes reported     No    Fall or balance concerns reported    No    Tobacco Cessation No Change    Warm-up and Cool-down Performed on first and last piece of equipment    Resistance Training Performed Yes    VAD Patient? No    PAD/SET Patient? No      Pain Assessment   Currently in Pain? No/denies             Social History   Tobacco Use  Smoking Status Former   Current packs/day: 1.50   Average packs/day: 1.5 packs/day for 30.0 years (45.0 ttl pk-yrs)   Types: Cigarettes  Smokeless Tobacco Never  Tobacco Comments   Quit in 2021.    Goals Met:  Independence with exercise equipment Exercise tolerated well No report of concerns or symptoms today Strength training completed today  Goals Unmet:  Not Applicable  Comments: Pt able to follow exercise prescription today without complaint.  Will continue to monitor for progression.    Dr. Oneil Pinal is Medical Director for [facility] Cardiac Rehabilitation.  Dr. Fuad Aleskerov is Medical Director for [facility] Pulmonary Rehabilitation.

## 2024-03-18 ENCOUNTER — Encounter

## 2024-03-20 ENCOUNTER — Encounter

## 2024-03-22 ENCOUNTER — Encounter

## 2024-03-25 ENCOUNTER — Encounter

## 2024-03-27 ENCOUNTER — Encounter

## 2024-03-29 ENCOUNTER — Encounter

## 2024-04-01 ENCOUNTER — Encounter

## 2024-04-03 ENCOUNTER — Encounter

## 2024-04-05 ENCOUNTER — Encounter

## 2024-04-08 ENCOUNTER — Encounter

## 2024-04-10 ENCOUNTER — Encounter

## 2024-04-10 DIAGNOSIS — R0609 Other forms of dyspnea: Secondary | ICD-10-CM

## 2024-04-10 NOTE — Progress Notes (Signed)
 Pulmonary Individual Treatment Plan  Patient Details  Name: Michelle Acevedo MRN: [id number] Date of Birth: 1951-02-15 Referring Provider:   Flowsheet Row Pulmonary Rehab from 02/16/2024 in [facility] Cardiac and Pulmonary Rehab  Referring Provider Dr. Dickey Orem    Initial Encounter Date:  Flowsheet Row Pulmonary Rehab from 02/16/2024 in [facility] Cardiac and Pulmonary Rehab  Date 02/16/24    Visit Diagnosis: DOE (dyspnea on exertion)  Patient's Home Medications on Admission:  Current Outpatient Medications:    acetaminophen  (TYLENOL ) 325 MG tablet, Take 325 mg by mouth., Disp: , Rfl:    acetaminophen  (TYLENOL ) 500 MG tablet, Take 500 mg by mouth every 6 (six) hours as needed for moderate pain or headache., Disp: , Rfl:    albuterol  (VENTOLIN  HFA) 108 (90 Base) MCG/ACT inhaler, Inhale 1-2 puffs into the lungs every 6 (six) hours as needed for wheezing or shortness of breath. (Patient not taking: Reported on 02/14/2024), Disp: 18 g, Rfl: 0   albuterol  (VENTOLIN  HFA) 108 (90 Base) MCG/ACT inhaler, Inhale 2 puffs into the lungs., Disp: , Rfl:    Albuterol  Sulfate (PROAIR  RESPICLICK) 108 (90 Base) MCG/ACT AEPB, Inhale 2 puffs into the lungs., Disp: , Rfl:    furosemide (LASIX) 40 MG tablet, Take 40 mg by mouth daily.  (Patient not taking: Reported on 02/14/2024), Disp: , Rfl:    Ipratropium-Albuterol  (COMBIVENT) 20-100 MCG/ACT AERS respimat, Inhale 2 puffs into the lungs 3 (three) times daily as needed for shortness of breath., Disp: , Rfl:    lactulose (CHRONULAC) 10 GM/15ML solution, Take 10 g by mouth 3 (three) times daily as needed (for elevated ammonia level)., Disp: , Rfl:    NON FORMULARY, B3, Disp: , Rfl:    oxyCODONE  (ROXICODONE ) 5 MG immediate release tablet, Take 1 tablet (5 mg total) by mouth every 8 (eight) hours as needed for up to 16 doses for breakthrough pain or severe pain. (Patient not taking: Reported on 02/14/2024), Disp: 16 tablet, Rfl: 0   pantoprazole (PROTONIX) 40 MG tablet, Take 40 mg  by mouth every morning.  (Patient not taking: Reported on 02/14/2024), Disp: , Rfl:    pantoprazole (PROTONIX) 40 MG tablet, Take 40 mg by mouth., Disp: , Rfl:    Potassium 99 MG TABS, Take 99 mg by mouth daily., Disp: , Rfl:    rifaximin (XIFAXAN) 550 MG TABS tablet, Take 550 mg by mouth every morning.  (Patient not taking: Reported on 02/14/2024), Disp: , Rfl:    spironolactone (ALDACTONE) 100 MG tablet, Take 100 mg by mouth daily. , Disp: , Rfl:    tacrolimus (PROGRAF) 5 MG capsule, Take 25 mg by mouth 2 (two) times daily., Disp: , Rfl:    traZODone (DESYREL) 50 MG tablet, Take 50 mg by mouth at bedtime., Disp: , Rfl:   Past Medical History: Past Medical History:  Diagnosis Date   Anemia    Arthritis    KNEE KLEFT   Cancer (HCC)    Labia   Chronic hepatitis C with cirrhosis (HCC)    Cirrhosis (HCC)    Emphysema of lung (HCC)    mild   GERD (gastroesophageal reflux disease)    Hepatitis C    History of esophageal varices    Hx of transfusion of whole blood    Macular degeneration    LEFT EYE-RECEIVES INJECTION IN LEFT EYE FOR THIS EVERY 9 WEEKS    Tobacco Use: Social History   Tobacco Use  Smoking Status Former   Current packs/day: 1.50   Average  packs/day: 1.5 packs/day for 30.0 years (45.0 ttl pk-yrs)   Types: Cigarettes  Smokeless Tobacco Never  Tobacco Comments   Quit in 2021.    Labs: Review Flowsheet        No data to display           Pulmonary Assessment Scores:  Pulmonary Assessment Scores     Row Name 02/14/24 1352 02/14/24 1450       ADL UCSD   ADL Phase Entry Entry    SOB Score total -- 3    Rest -- 0    Walk -- 0    Stairs -- 3    Bath -- 0    Dress -- 0    Shop -- 0      CAT Score   CAT Score 21 --       UCSD: Self-administered rating of dyspnea associated with activities of daily living (ADLs) 6-point scale (0 = not at all to 5 = maximal or unable to do because of breathlessness)  Scoring Scores range from 0 to 120.   Minimally important difference is 5 units  CAT: CAT can identify the health impairment of COPD patients and is better correlated with disease progression.  CAT has a scoring range of zero to 40. The CAT score is classified into four groups of low (less than 10), medium (10 - 20), high (21-30) and very high (31-40) based on the impact level of disease on health status. A CAT score over 10 suggests significant symptoms.  A worsening CAT score could be explained by an exacerbation, poor medication adherence, poor inhaler technique, or progression of COPD or comorbid conditions.  CAT MCID is 2 points  mMRC: mMRC (Modified Medical Research Council) Dyspnea Scale is used to assess the degree of baseline functional disability in patients of respiratory disease due to dyspnea. No minimal important difference is established. A decrease in score of 1 point or greater is considered a positive change.   Pulmonary Function Assessment:  Pulmonary Function Assessment - 02/14/24 1341       Breath   Shortness of Breath Yes;Limiting activity          Exercise Target Goals: Exercise Program Goal: Individual exercise prescription set using results from initial 6 min walk test and THRR while considering  patient's activity barriers and safety.   Exercise Prescription Goal: Initial exercise prescription builds to 30-45 minutes a day of aerobic activity, 2-3 days per week.  Home exercise guidelines will be given to patient during program as part of exercise prescription that the participant will acknowledge.  Education: Aerobic Exercise: - Group verbal and visual presentation on the components of exercise prescription. Introduces F.I.T.T principle from ACSM for exercise prescriptions.  Reviews F.I.T.T. principles of aerobic exercise including progression. Written material given at graduation.   Education: Resistance Exercise: - Group verbal and visual presentation on the components of exercise  prescription. Introduces F.I.T.T principle from ACSM for exercise prescriptions  Reviews F.I.T.T. principles of resistance exercise including progression. Written material given at graduation.    Education: Exercise & Equipment Safety: - Individual verbal instruction and demonstration of equipment use and safety with use of the equipment. Flowsheet Row Pulmonary Rehab from 02/14/2024 in [facility] Cardiac and Pulmonary Rehab  Date 02/14/24  Educator [facility]  Instruction Review Code 1- Verbalizes Understanding    Education: Exercise Physiology & General Exercise Guidelines: - Group verbal and written instruction with models to review the exercise physiology of the cardiovascular system and associated critical  values. Provides general exercise guidelines with specific guidelines to those with heart or lung disease.  Flowsheet Row Pulmonary Rehab from 02/14/2024 in [facility] Cardiac and Pulmonary Rehab  Education need identified 02/14/24    Education: Flexibility, Balance, Mind/Body Relaxation: - Group verbal and visual presentation with interactive activity on the components of exercise prescription. Introduces F.I.T.T principle from ACSM for exercise prescriptions. Reviews F.I.T.T. principles of flexibility and balance exercise training including progression. Also discusses the mind body connection.  Reviews various relaxation techniques to help reduce and manage stress (i.e. Deep breathing, progressive muscle relaxation, and visualization). Balance handout provided to take home. Written material given at graduation.   Activity Barriers & Risk Stratification:  Activity Barriers & Cardiac Risk Stratification - 02/16/24 1121       Activity Barriers & Cardiac Risk Stratification   Activity Barriers Joint Problems;Shortness of Breath          6 Minute Walk:  6 Minute Walk     Row Name 02/16/24 1116         6 Minute Walk   Phase Initial     Distance 1480 feet     Walk Time 6 minutes     # of Rest  Breaks 0     MPH 2.8     METS 2.9     RPE 9     Perceived Dyspnea  0     VO2 Peak 10.8     Symptoms No     Resting HR 86 bpm     Resting BP 128/60     Resting Oxygen Saturation  95 %     Exercise Oxygen Saturation  during 6 min walk 92 %     Max Ex. HR 115 bpm     Max Ex. BP 144/68     2 Minute Post BP 124/62       Interval HR   1 Minute HR 100     2 Minute HR 115     3 Minute HR 115     4 Minute HR 71     5 Minute HR 115     6 Minute HR 111     2 Minute Post HR 89     Interval Heart Rate? Yes       Interval Oxygen   Interval Oxygen? Yes     Baseline Oxygen Saturation % 95 %     1 Minute Oxygen Saturation % 95 %     1 Minute Liters of Oxygen 0 L     2 Minute Oxygen Saturation % 98 %     2 Minute Liters of Oxygen 0 L     3 Minute Oxygen Saturation % 93 %     3 Minute Liters of Oxygen 0 L     4 Minute Oxygen Saturation % 92 %     4 Minute Liters of Oxygen 0 L     5 Minute Oxygen Saturation % 97 %     5 Minute Liters of Oxygen 0 L     6 Minute Oxygen Saturation % 97 %     6 Minute Liters of Oxygen 0 L     2 Minute Post Oxygen Saturation % 98 %     2 Minute Post Liters of Oxygen 0 L       Oxygen Initial Assessment:  Oxygen Initial Assessment - 02/14/24 1341       Home Oxygen   Home Oxygen Device None    Sleep  Oxygen Prescription None    Home Exercise Oxygen Prescription None    Home Resting Oxygen Prescription None      Initial 6 min Walk   Oxygen Used None      Program Oxygen Prescription   Program Oxygen Prescription None      Intervention   Short Term Goals To learn and exhibit compliance with exercise, home and travel O2 prescription;To learn and understand importance of monitoring SPO2 with pulse oximeter and demonstrate accurate use of the pulse oximeter.;To learn and understand importance of maintaining oxygen saturations>88%;To learn and demonstrate proper pursed lip breathing techniques or other breathing techniques. ;To learn and demonstrate proper  use of respiratory medications    Long  Term Goals Exhibits compliance with exercise, home  and travel O2 prescription;Verbalizes importance of monitoring SPO2 with pulse oximeter and return demonstration;Maintenance of O2 saturations>88%;Exhibits proper breathing techniques, such as pursed lip breathing or other method taught during program session;Compliance with respiratory medication;Demonstrates proper use of MDI's          Oxygen Re-Evaluation:  Oxygen Re-Evaluation     Row Name 02/16/24 1108 03/08/24 1116           Program Oxygen Prescription   Program Oxygen Prescription None None        Home Oxygen   Home Oxygen Device None None      Sleep Oxygen Prescription None None      Home Exercise Oxygen Prescription None None      Home Resting Oxygen Prescription None None        Goals/Expected Outcomes   Short Term Goals To learn and exhibit compliance with exercise, home and travel O2 prescription;To learn and understand importance of monitoring SPO2 with pulse oximeter and demonstrate accurate use of the pulse oximeter.;To learn and understand importance of maintaining oxygen saturations>88%;To learn and demonstrate proper pursed lip breathing techniques or other breathing techniques. ;To learn and demonstrate proper use of respiratory medications To learn and demonstrate proper pursed lip breathing techniques or other breathing techniques.       Long  Term Goals Exhibits compliance with exercise, home  and travel O2 prescription;Verbalizes importance of monitoring SPO2 with pulse oximeter and return demonstration;Maintenance of O2 saturations>88%;Exhibits proper breathing techniques, such as pursed lip breathing or other method taught during program session;Compliance with respiratory medication;Demonstrates proper use of MDI's Exhibits proper breathing techniques, such as pursed lip breathing or other method taught during program session      Comments Reviewed PLB technique with pt.   Talked about how it works and it's importance in maintaining their exercise saturations. Informed patient how to perform the Pursed Lipped breathing technique. Told patient to Inhale through the nose and out the mouth with pursed lips to keep their airways open, help oxygenate them better, practice when at rest or doing strenuous activity. Patient Verbalizes understanding of technique and will work on and be reiterated during [facility].      Goals/Expected Outcomes Short: Become more profiecient at using PLB. Long: Become independent at using PLB. Short: use PLB with exertion. Long: use PLB on exertion proficiently and independently.         Oxygen Discharge (Final Oxygen Re-Evaluation):  Oxygen Re-Evaluation - 03/08/24 1116       Program Oxygen Prescription   Program Oxygen Prescription None      Home Oxygen   Home Oxygen Device None    Sleep Oxygen Prescription None    Home Exercise Oxygen Prescription None    Home Resting  Oxygen Prescription None      Goals/Expected Outcomes   Short Term Goals To learn and demonstrate proper pursed lip breathing techniques or other breathing techniques.     Long  Term Goals Exhibits proper breathing techniques, such as pursed lip breathing or other method taught during program session    Comments Informed patient how to perform the Pursed Lipped breathing technique. Told patient to Inhale through the nose and out the mouth with pursed lips to keep their airways open, help oxygenate them better, practice when at rest or doing strenuous activity. Patient Verbalizes understanding of technique and will work on and be reiterated during [facility].    Goals/Expected Outcomes Short: use PLB with exertion. Long: use PLB on exertion proficiently and independently.          Initial Exercise Prescription:  Initial Exercise Prescription - 02/16/24 1100       Date of Initial Exercise RX and Referring Provider   Date 02/16/24    Referring Provider Dr. Dickey Orem      Oxygen   Maintain Oxygen Saturation 88% or higher      Treadmill   MPH 2.8    Grade 0    Minutes 15    METs 2.9      Recumbant Bike   Level 3    RPM 50    Watts 25    Minutes 15    METs 2.9      NuStep   Level 3    SPM 80    Minutes 15    METs 2.9      Rower   Level 1    Watts 25    Minutes 15    METs 2.9      Intensity   THRR 40-80% of Max Heartrate 109-133    Ratings of Perceived Exertion 11-13    Perceived Dyspnea 0-4      Resistance Training   Training Prescription Yes    Weight 3lb    Reps 10-15          Perform Capillary Blood Glucose checks as needed.  Exercise Prescription Changes:   Exercise Prescription Changes     Row Name 02/16/24 1100 02/28/24 1400 03/12/24 1300         Response to Exercise   Blood Pressure (Admit) 128/60 118/72 134/60     Blood Pressure (Exercise) 144/68 136/72 146/60     Blood Pressure (Exit) 124/62 120/68 118/62     Heart Rate (Admit) 86 bpm 79 bpm 85 bpm     Heart Rate (Exercise) 115 bpm 103 bpm 108 bpm     Heart Rate (Exit) 89 bpm 91 bpm 97 bpm     Oxygen Saturation (Admit) 95 % 94 % 96 %     Oxygen Saturation (Exercise) 92 % 93 % 90 %     Oxygen Saturation (Exit) 98 % 96 % 94 %     Rating of Perceived Exertion (Exercise) 9 11 12      Perceived Dyspnea (Exercise) 0 0 1     Symptoms R knee pain none none     Comments results first 2 weeks of exercise --     Duration -- Progress to 30 minutes of  aerobic without signs/symptoms of physical distress Continue with 30 min of aerobic exercise without signs/symptoms of physical distress.     Intensity -- THRR unchanged THRR unchanged       Progression   Progression -- Continue to progress workloads to  maintain intensity without signs/symptoms of physical distress. Continue to progress workloads to maintain intensity without signs/symptoms of physical distress.     Average METs -- 2.89 2.94       Resistance Training   Training Prescription -- Yes Yes      Weight -- 3 4 lb     Reps -- 10-15 10-15       Interval Training   Interval Training -- No No       Treadmill   MPH -- 2.3 2.5     Grade -- 0 0     Minutes -- 15 15     METs -- 2.76 2.91       Recumbant Bike   Level -- 2 3     Watts -- 25 30     Minutes -- 15 15     METs -- 3.04 3.03       NuStep   Level -- -- 1  T6 nustep: level 2     Minutes -- -- 15     METs -- -- --  T6 nustep: 3.3 mets       Biostep-RELP   Level -- 1 --     Minutes -- 15 --     METs -- 3 --       Oxygen   Maintain Oxygen Saturation -- 88% or higher 88% or higher        Exercise Comments:   Exercise Comments     Row Name 02/16/24 1107           Exercise Comments First full day of exercise!  Patient was oriented to gym and equipment including functions, settings, policies, and procedures.  Patient's individual exercise prescription and treatment plan were reviewed.  All starting workloads were established based on the results of the 6 minute walk test done at initial orientation visit.  The plan for exercise progression was also introduced and progression will be customized based on patient's performance and goals.          Exercise Goals and Review:   Exercise Goals     Row Name 02/16/24 1130             Exercise Goals   Increase Physical Activity Yes       Intervention Provide advice, education, support and counseling about physical activity/exercise needs.;Develop an individualized exercise prescription for aerobic and resistive training based on initial evaluation findings, risk stratification, comorbidities and participant's personal goals.       Expected Outcomes Short Term: Attend rehab on a regular basis to increase amount of physical activity.;Long Term: Add in home exercise to make exercise part of routine and to increase amount of physical activity.;Long Term: Exercising regularly at least 3-5 days a week.       Increase Strength and Stamina Yes       Intervention Provide  advice, education, support and counseling about physical activity/exercise needs.;Develop an individualized exercise prescription for aerobic and resistive training based on initial evaluation findings, risk stratification, comorbidities and participant's personal goals.       Expected Outcomes Short Term: Increase workloads from initial exercise prescription for resistance, speed, and METs.;Short Term: Perform resistance training exercises routinely during rehab and add in resistance training at home;Long Term: Improve cardiorespiratory fitness, muscular endurance and strength as measured by increased METs and functional capacity ( )       Able to understand and use rate of perceived exertion (RPE) scale Yes  Intervention Provide education and explanation on how to use RPE scale       Expected Outcomes Short Term: Able to use RPE daily in rehab to express subjective intensity level;Long Term:  Able to use RPE to guide intensity level when exercising independently       Able to understand and use Dyspnea scale Yes       Intervention Provide education and explanation on how to use Dyspnea scale       Expected Outcomes Short Term: Able to use Dyspnea scale daily in rehab to express subjective sense of shortness of breath during exertion;Long Term: Able to use Dyspnea scale to guide intensity level when exercising independently       Knowledge and understanding of Target Heart Rate Range (THRR) Yes       Intervention Provide education and explanation of THRR including how the numbers were predicted and where they are located for reference       Expected Outcomes Short Term: Able to state/look up THRR;Short Term: Able to use daily as guideline for intensity in rehab;Long Term: Able to use THRR to govern intensity when exercising independently       Able to check pulse independently Yes       Intervention Provide education and demonstration on how to check pulse in carotid and radial arteries.;Review  the importance of being able to check your own pulse for safety during independent exercise       Expected Outcomes Short Term: Able to explain why pulse checking is important during independent exercise;Long Term: Able to check pulse independently and accurately       Understanding of Exercise Prescription Yes       Intervention Provide education, explanation, and written materials on patient's individual exercise prescription       Expected Outcomes Short Term: Able to explain program exercise prescription;Long Term: Able to explain home exercise prescription to exercise independently          Exercise Goals Re-Evaluation :  Exercise Goals Re-Evaluation     Row Name 02/16/24 1107 02/28/24 1449 03/12/24 1359         Exercise Goal Re-Evaluation   Exercise Goals Review Able to understand and use rate of perceived exertion (RPE) scale;Knowledge and understanding of Target Heart Rate Range (THRR);Understanding of Exercise Prescription;Increase Physical Activity;Increase Strength and Stamina;Able to understand and use Dyspnea scale;Able to check pulse independently Increase Strength and Stamina;Increase Physical Activity;Understanding of Exercise Prescription Increase Strength and Stamina;Increase Physical Activity;Understanding of Exercise Prescription     Comments Reviewed RPE and dyspnea scale, THR and program prescription with pt today.  Pt voiced understanding and was given a copy of goals to take home. Rhoda is off to a good start in the program. She was able to attend her first 2 sessions during this review period. During her sessions she was able to use the treadmill at a speed of 2. and no incline, and the recumbent bike at level 2. We will continue to monitor her progress in the program. Rhoda is doing well in rehab. She increased her workload on the treadmill to a speed of 2.5 mph with no incline. She also improved to level 3 on the recumbent bike and increased to 4 lb handweights for  resistance training. We will continue to monitor her progress in the program.     Expected Outcomes Short: Use RPE daily to regulate intensity. Long: Follow program prescription in THR. Short: Continue to follow exercise prescription. Long: Continue exercise to  improve strength and stamina. Short: Continue to progressively increase treadmill workload. Long: Continue exercise to improve strength and stamina.        Discharge Exercise Prescription (Final Exercise Prescription Changes):  Exercise Prescription Changes - 03/12/24 1300       Response to Exercise   Blood Pressure (Admit) 134/60    Blood Pressure (Exercise) 146/60    Blood Pressure (Exit) 118/62    Heart Rate (Admit) 85 bpm    Heart Rate (Exercise) 108 bpm    Heart Rate (Exit) 97 bpm    Oxygen Saturation (Admit) 96 %    Oxygen Saturation (Exercise) 90 %    Oxygen Saturation (Exit) 94 %    Rating of Perceived Exertion (Exercise) 12    Perceived Dyspnea (Exercise) 1    Symptoms none    Duration Continue with 30 min of aerobic exercise without signs/symptoms of physical distress.    Intensity THRR unchanged      Progression   Progression Continue to progress workloads to maintain intensity without signs/symptoms of physical distress.    Average METs 2.94      Resistance Training   Training Prescription Yes    Weight 4 lb    Reps 10-15      Interval Training   Interval Training No      Treadmill   MPH 2.5    Grade 0    Minutes 15    METs 2.91      Recumbant Bike   Level 3    Watts 30    Minutes 15    METs 3.03      NuStep   Level 1   T6 nustep: level 2   Minutes 15    METs --   T6 nustep: 3.3 mets     Oxygen   Maintain Oxygen Saturation 88% or higher          Nutrition:  Target Goals: Understanding of nutrition guidelines, daily intake of sodium 1500mg , cholesterol 200mg , calories 30% from fat and 7% or less from saturated fats, daily to have 5 or more servings of fruits and  vegetables.  Education: All About Nutrition: -Group instruction provided by verbal, written material, interactive activities, discussions, models, and posters to present general guidelines for heart healthy nutrition including fat, fiber, MyPlate, the role of sodium in heart healthy nutrition, utilization of the nutrition label, and utilization of this knowledge for meal planning. Follow up email sent as well. Written material given at graduation.   Biometrics:  Pre Biometrics - 02/16/24 1131       Pre Biometrics   Height 5' 1.5 (1.562 m)    Weight 166 lb 1.6 oz (75.3 kg)    Waist Circumference 39 inches    Hip Circumference 45.5 inches    Waist to Hip Ratio 0.86 %    BMI (Calculated) 30.88    Single Leg Stand 3.5 seconds           Nutrition Therapy Plan and Nutrition Goals:   Nutrition Assessments:  MEDIFICTS Score Key: >=70 Need to make dietary changes  40-70 Heart Healthy Diet <= 40 Therapeutic Level Cholesterol Diet  Flowsheet Row Pulmonary Rehab from 02/16/2024 in [facility] Cardiac and Pulmonary Rehab  Picture Your Plate Total Score on Admission 50   Picture Your Plate Scores: <59 Unhealthy dietary pattern with much room for improvement. 41-50 Dietary pattern unlikely to meet recommendations for good health and room for improvement. 51-60 More healthful dietary pattern, with some room for improvement.  >  60 Healthy dietary pattern, although there may be some specific behaviors that could be improved.   Nutrition Goals Re-Evaluation:  Nutrition Goals Re-Evaluation     Row Name 03/08/24 1119             Goals   Comment Patient was informed on why it is important to maintain a balanced diet when dealing with Respiratory issues. Explained that it takes a lot of energy to breath and when they are short of breath often they will need to have a good diet to help keep up with the calories they are expending for breathing.       Expected Outcome Short: Choose and plan  snacks accordingly to patients caloric intake to improve breathing. Long: Maintain a diet independently that meets their caloric intake to aid in daily shortness of breath.          Nutrition Goals Discharge (Final Nutrition Goals Re-Evaluation):  Nutrition Goals Re-Evaluation - 03/08/24 1119       Goals   Comment Patient was informed on why it is important to maintain a balanced diet when dealing with Respiratory issues. Explained that it takes a lot of energy to breath and when they are short of breath often they will need to have a good diet to help keep up with the calories they are expending for breathing.    Expected Outcome Short: Choose and plan snacks accordingly to patients caloric intake to improve breathing. Long: Maintain a diet independently that meets their caloric intake to aid in daily shortness of breath.          Psychosocial: Target Goals: Acknowledge presence or absence of significant depression and/or stress, maximize coping skills, provide positive support system. Participant is able to verbalize types and ability to use techniques and skills needed for reducing stress and depression.   Education: Stress, Anxiety, and Depression - Group verbal and visual presentation to define topics covered.  Reviews how body is impacted by stress, anxiety, and depression.  Also discusses healthy ways to reduce stress and to treat/manage anxiety and depression.  Written material given at graduation.   Education: Sleep Hygiene -Provides group verbal and written instruction about how sleep can affect your health.  Define sleep hygiene, discuss sleep cycles and impact of sleep habits. Review good sleep hygiene tips.    Initial Review & Psychosocial Screening:  Initial Psych Review & Screening - 02/14/24 1348       Initial Review   Current issues with Current Sleep Concerns      Family Dynamics   Good Support System? Yes    Comments Rhoda can look to her sisters, boyfriend,  niece and friends for support. She takes medication for sleep and does not take anything else for her mood.      Barriers   Psychosocial barriers to participate in program The patient should benefit from training in stress management and relaxation.      Screening Interventions   Interventions To provide support and resources with identified psychosocial needs;Encouraged to exercise;Provide feedback about the scores to participant    Expected Outcomes Short Term goal: Utilizing psychosocial counselor, staff and physician to assist with identification of specific Stressors or current issues interfering with healing process. Setting desired goal for each stressor or current issue identified.;Long Term Goal: Stressors or current issues are controlled or eliminated.;Short Term goal: Identification and review with participant of any Quality of Life or Depression concerns found by scoring the questionnaire.;Long Term goal: The participant improves quality  of Life and PHQ9 Scores as seen by post scores and/or verbalization of changes          Quality of Life Scores:  Scores of 19 and below usually indicate a poorer quality of life in these areas.  A difference of  2-3 points is a clinically meaningful difference.  A difference of 2-3 points in the total score of the Quality of Life Index has been associated with significant improvement in overall quality of life, self-image, physical symptoms, and general health in studies assessing change in quality of life.  PHQ-9: Review Flowsheet       03/08/2024 02/14/2024  Depression screen PHQ 2/9  Decreased Interest 0 0 0  Down, Depressed, Hopeless 0 0 0  PHQ - 2 Score 0 0 0  Altered sleeping 3 3 3   Tired, decreased energy 1 1 1   Change in appetite 1 1 1   Feeling bad or failure about yourself  0 0 0  Trouble concentrating 0 0 0  Moving slowly or fidgety/restless 0 0 0  Suicidal thoughts 0 0 0  PHQ-9 Score 5 5 5   Difficult doing work/chores Not  difficult at all Not difficult at all Not difficult at all    Details       Multiple values from one day are sorted in reverse-chronological order        Interpretation of Total Score  Total Score Depression Severity:  1-4 = Minimal depression, 5-9 = Mild depression, 10-14 = Moderate depression, 15-19 = Moderately severe depression, 20-27 = Severe depression   Psychosocial Evaluation and Intervention:  Psychosocial Evaluation - 02/14/24 1349       Psychosocial Evaluation & Interventions   Interventions Relaxation education;Encouraged to exercise with the program and follow exercise prescription;Stress management education    Comments Rhoda can look to her sisters, boyfriend, niece and friends for support. She takes medication for sleep and does not take anything else for her mood.    Expected Outcomes Short: Start [facility] to help with mood. Long: Maintain a healthy mental state    Continue Psychosocial Services  Follow up required by staff          Psychosocial Re-Evaluation:  Psychosocial Re-Evaluation     Row Name 03/08/24 1120             Psychosocial Re-Evaluation   Current issues with Current Sleep Concerns       Comments Reviewed patient health questionnaire (PHQ-9) with patient for follow up. Previously, patients score indicated signs/symptoms of depression.  Reviewed to see if patient is improving symptom wise while in program.  Score stayed the same and patient states that it is because she has trouble sleeping. .       Expected Outcomes Short: Continue to attend [facility] regularly for regular exercise and social engagement. Long: Continue to improve symptoms and manage a positive mental state.       Interventions Encouraged to attend Pulmonary Rehabilitation for the exercise       Continue Psychosocial Services  Follow up required by staff          Psychosocial Discharge (Final Psychosocial Re-Evaluation):  Psychosocial Re-Evaluation - 03/08/24 1120        Psychosocial Re-Evaluation   Current issues with Current Sleep Concerns    Comments Reviewed patient health questionnaire (PHQ-9) with patient for follow up. Previously, patients score indicated signs/symptoms of depression.  Reviewed to see if patient is improving symptom wise while in program.  Score stayed the same and  patient states that it is because she has trouble sleeping. .    Expected Outcomes Short: Continue to attend [facility] regularly for regular exercise and social engagement. Long: Continue to improve symptoms and manage a positive mental state.    Interventions Encouraged to attend Pulmonary Rehabilitation for the exercise    Continue Psychosocial Services  Follow up required by staff          Education: Education Goals: Education classes will be provided on a weekly basis, covering required topics. Participant will state understanding/return demonstration of topics presented.  Learning Barriers/Preferences:  Learning Barriers/Preferences - 02/14/24 1341       Learning Barriers/Preferences   Learning Barriers Sight    Learning Preferences None          General Pulmonary Education Topics:  Infection Prevention: - Provides verbal and written material to individual with discussion of infection control including proper hand washing and proper equipment cleaning during exercise session. Flowsheet Row Pulmonary Rehab from 02/14/2024 in [facility] Cardiac and Pulmonary Rehab  Date 02/14/24  Educator [facility]  Instruction Review Code 1- Verbalizes Understanding    Falls Prevention: - Provides verbal and written material to individual with discussion of falls prevention and safety. Flowsheet Row Pulmonary Rehab from 02/14/2024 in [facility] Cardiac and Pulmonary Rehab  Date 02/14/24  Educator [facility]  Instruction Review Code 1- Verbalizes Understanding    Chronic Lung Disease Review: - Group verbal instruction with posters, models, PowerPoint presentations and videos,  to review new  updates, new respiratory medications, new advancements in procedures and treatments. Providing information on websites and 800 numbers for continued self-education. Includes information about supplement oxygen, available portable oxygen systems, continuous and intermittent flow rates, oxygen safety, concentrators, and Medicare reimbursement for oxygen. Explanation of Pulmonary Drugs, including class, frequency, complications, importance of spacers, rinsing mouth after steroid MDI's, and proper cleaning methods for nebulizers. Review of basic lung anatomy and physiology related to function, structure, and complications of lung disease. Review of risk factors. Discussion about methods for diagnosing sleep apnea and types of masks and machines for OSA. Includes a review of the use of types of environmental controls: home humidity, furnaces, filters, dust mite/pet prevention, HEPA vacuums. Discussion about weather changes, air quality and the benefits of nasal washing. Instruction on Warning signs, infection symptoms, calling MD promptly, preventive modes, and value of vaccinations. Review of effective airway clearance, coughing and/or vibration techniques. Emphasizing that all should Create an Action Plan. Written material given at graduation. Flowsheet Row Pulmonary Rehab from 02/14/2024 in [facility] Cardiac and Pulmonary Rehab  Education need identified 02/14/24    AED/CPR: - Group verbal and written instruction with the use of models to demonstrate the basic use of the AED with the basic ABC's of resuscitation.    Anatomy and Cardiac Procedures: - Group verbal and visual presentation and models provide information about basic cardiac anatomy and function. Reviews the testing methods done to diagnose heart disease and the outcomes of the test results. Describes the treatment choices: Medical Management, Angioplasty, or Coronary Bypass Surgery for treating various heart conditions including Myocardial  Infarction, Angina, Valve Disease, and Cardiac Arrhythmias.  Written material given at graduation.   Medication Safety: - Group verbal and visual instruction to review commonly prescribed medications for heart and lung disease. Reviews the medication, class of the drug, and side effects. Includes the steps to properly store meds and maintain the prescription regimen.  Written material given at graduation.   Other: -Provides group and verbal instruction on various topics (  see comments)   Knowledge Questionnaire Score:  Knowledge Questionnaire Score - 02/14/24 1453       Knowledge Questionnaire Score   Pre Score 14/18           Core Components/Risk Factors/Patient Goals at Admission:  Personal Goals and Risk Factors at Admission - 02/14/24 1342       Core Components/Risk Factors/Patient Goals on Admission    Weight Management Yes;Weight Loss    Intervention Weight Management: Develop a combined nutrition and exercise program designed to reach desired caloric intake, while maintaining appropriate intake of nutrient and fiber, sodium and fats, and appropriate energy expenditure required for the weight goal.;Weight Management: Provide education and appropriate resources to help participant work on and attain dietary goals.;Weight Management/Obesity: Establish reasonable short term and long term weight goals.    Expected Outcomes Short Term: Continue to assess and modify interventions until short term weight is achieved;Long Term: Adherence to nutrition and physical activity/exercise program aimed toward attainment of established weight goal;Weight Loss: Understanding of general recommendations for a balanced deficit meal plan, which promotes 1-2 lb weight loss per week and includes a negative energy balance of [id number] kcal/d;Understanding recommendations for meals to include 15-35% energy as protein, 25-35% energy from fat, 35-60% energy from carbohydrates, less than 200mg  of dietary  cholesterol, 20-35 gm of total fiber daily;Understanding of distribution of calorie intake throughout the day with the consumption of 4-5 meals/snacks    Improve shortness of breath with ADL's Yes    Intervention Provide education, individualized exercise plan and daily activity instruction to help decrease symptoms of SOB with activities of daily living.    Expected Outcomes Short Term: Improve cardiorespiratory fitness to achieve a reduction of symptoms when performing ADLs;Long Term: Be able to perform more ADLs without symptoms or delay the onset of symptoms          Education:Diabetes - Individual verbal and written instruction to review signs/symptoms of diabetes, desired ranges of glucose level fasting, after meals and with exercise. Acknowledge that pre and post exercise glucose checks will be done for 3 sessions at entry of program.   Know Your Numbers and Heart Failure: - Group verbal and visual instruction to discuss disease risk factors for cardiac and pulmonary disease and treatment options.  Reviews associated critical values for Overweight/Obesity, Hypertension, Cholesterol, and Diabetes.  Discusses basics of heart failure: signs/symptoms and treatments.  Introduces Heart Failure Zone chart for action plan for heart failure.  Written material given at graduation.   Core Components/Risk Factors/Patient Goals Review:   Goals and Risk Factor Review     Row Name 03/08/24 1118             Core Components/Risk Factors/Patient Goals Review   Personal Goals Review Improve shortness of breath with ADL's       Review Spoke to patient about their shortness of breath and what they can do to improve. Patient has been informed of breathing techniques when starting the program. Patient is informed to tell staff if they have had any med changes and that certain meds they are taking or not taking can be causing shortness of breath.       Expected Outcomes Short: Attend [facility] regularly  to improve shortness of breath with ADL's. Long: maintain independence with ADL's          Core Components/Risk Factors/Patient Goals at Discharge (Final Review):   Goals and Risk Factor Review - 03/08/24 1118       Core Components/Risk Factors/Patient  Goals Review   Personal Goals Review Improve shortness of breath with ADL's    Review Spoke to patient about their shortness of breath and what they can do to improve. Patient has been informed of breathing techniques when starting the program. Patient is informed to tell staff if they have had any med changes and that certain meds they are taking or not taking can be causing shortness of breath.    Expected Outcomes Short: Attend [facility] regularly to improve shortness of breath with ADL's. Long: maintain independence with ADL's          ITP Comments:  ITP Comments     Row Name 02/16/24 1107 03/13/24 1100 04/10/24 1008       ITP Comments First full day of exercise!  Patient was oriented to gym and equipment including functions, settings, policies, and procedures.  Patient's individual exercise prescription and treatment plan were reviewed.  All starting workloads were established based on the results of the 6 minute walk test done at initial orientation visit.  The plan for exercise progression was also introduced and progression will be customized based on patient's performance and goals. 30 Day review completed. Medical Director ITP review done, changes made as directed, and signed approval by Medical Director. 30 Day review completed. Medical Director ITP review done, changes made as directed, and signed approval by Medical Director.        Comments: 30 day review

## 2024-04-12 ENCOUNTER — Encounter

## 2024-04-15 ENCOUNTER — Encounter

## 2024-04-16 ENCOUNTER — Telehealth: Payer: Self-pay

## 2024-04-16 NOTE — Telephone Encounter (Signed)
 We called Pt today to inform her that we had received clearance for her to return to rehab. Pt voiced understanding and will return on 04/17/2024.

## 2024-04-17 ENCOUNTER — Encounter

## 2024-04-19 ENCOUNTER — Encounter

## 2024-04-22 ENCOUNTER — Encounter

## 2024-04-22 ENCOUNTER — Encounter: Attending: Internal Medicine

## 2024-04-22 DIAGNOSIS — R0609 Other forms of dyspnea: Secondary | ICD-10-CM | POA: Insufficient documentation

## 2024-04-22 NOTE — Progress Notes (Signed)
 Daily Session Note  Patient Details  Name: Michelle Acevedo MRN: [id number] Date of Birth: August 07, 1951 Referring Provider:   Flowsheet Row Pulmonary Rehab from 02/16/2024 in [facility] Cardiac and Pulmonary Rehab  Referring Provider Dr. Dickey Orem    Encounter Date: 04/22/2024  Check In:  Session Check In - 04/22/24 1054       Check-In   Staff Present Burnard Davenport RN,BSN,MPA;Maxon Conetta BS, Exercise Physiologist;Joseph Roosevelt [facility] BS, ACSM CEP, Exercise Physiologist    Virtual Visit No    Medication changes reported     Yes    Comments added 100 mg gabapentin    Fall or balance concerns reported    No    Tobacco Cessation No Change    Warm-up and Cool-down Performed on first and last piece of equipment    Resistance Training Performed Yes    VAD Patient? No    PAD/SET Patient? No      Pain Assessment   Currently in Pain? No/denies             Social History   Tobacco Use  Smoking Status Former   Current packs/day: 1.50   Average packs/day: 1.5 packs/day for 30.0 years (45.0 ttl pk-yrs)   Types: Cigarettes  Smokeless Tobacco Never  Tobacco Comments   Quit in 2021.    Goals Met:  Proper associated with RPD/PD & O2 Sat Independence with exercise equipment Using PLB without cueing & demonstrates good technique Exercise tolerated well No report of concerns or symptoms today Strength training completed today  Goals Unmet:  Not Applicable  Comments: Pt able to follow exercise prescription today without complaint.  Will continue to monitor for progression.    Dr. Oneil Pinal is Medical Director for [facility] Cardiac Rehabilitation.  Dr. Fuad Aleskerov is Medical Director for [facility] Pulmonary Rehabilitation.

## 2024-04-24 ENCOUNTER — Encounter

## 2024-04-24 DIAGNOSIS — R0609 Other forms of dyspnea: Secondary | ICD-10-CM

## 2024-04-24 NOTE — Progress Notes (Signed)
 Daily Session Note  Patient Details  Name: Michelle Acevedo MRN: [id number] Date of Birth: 11/05/1950 Referring Provider:   Flowsheet Row Pulmonary Rehab from 02/16/2024 in [facility] Cardiac and Pulmonary Rehab  Referring Provider Dr. Dickey Orem    Encounter Date: 04/24/2024  Check In:  Session Check In - 04/24/24 1103       Check-In   Supervising physician immediately available to respond to emergencies See telemetry face sheet for immediately available ER MD    Location [facility]-Cardiac & Pulmonary Rehab    Staff Present Fairy Plater RCP,RRT,BSRT;Margaret Best, MS, Exercise Physiologist;Noah Tickle, BS, Exercise Physiologist;Kelly Bollinger [facility]    Virtual Visit No    Medication changes reported     No    Fall or balance concerns reported    No    Tobacco Cessation No Change    Warm-up and Cool-down Performed on first and last piece of equipment    Resistance Training Performed Yes    VAD Patient? No    PAD/SET Patient? No      Pain Assessment   Currently in Pain? No/denies             Social History   Tobacco Use  Smoking Status Former   Current packs/day: 1.50   Average packs/day: 1.5 packs/day for 30.0 years (45.0 ttl pk-yrs)   Types: Cigarettes  Smokeless Tobacco Never  Tobacco Comments   Quit in 2021.    Goals Met:  Proper associated with RPD/PD & O2 Sat Independence with exercise equipment Using PLB without cueing & demonstrates good technique Exercise tolerated well No report of concerns or symptoms today Strength training completed today  Goals Unmet:  Not Applicable  Comments: Pt able to follow exercise prescription today without complaint.  Will continue to monitor for progression.    Dr. Oneil Pinal is Medical Director for [facility] Cardiac Rehabilitation.  Dr. Fuad Aleskerov is Medical Director for [facility] Pulmonary Rehabilitation.

## 2024-04-26 ENCOUNTER — Encounter

## 2024-04-26 DIAGNOSIS — R0609 Other forms of dyspnea: Secondary | ICD-10-CM

## 2024-04-26 NOTE — Progress Notes (Signed)
 Daily Session Note  Patient Details  Name: Michelle Acevedo MRN: [id number] Date of Birth: 20-Jun-1951 Referring Provider:   Flowsheet Row Pulmonary Rehab from 02/16/2024 in [facility] Cardiac and Pulmonary Rehab  Referring Provider Dr. Dickey Orem    Encounter Date: 04/26/2024  Check In:  Session Check In - 04/26/24 1055       Check-In   Supervising physician immediately available to respond to emergencies See telemetry face sheet for immediately available ER MD    Location [facility]-Cardiac & Pulmonary Rehab    Staff Present Burnard Davenport RN,BSN,MPA;Joseph Hood RCP,RRT,BSRT;Maxon Conetta BS, Exercise Physiologist;Noah Tickle, BS, Exercise Physiologist    Virtual Visit No    Medication changes reported     No    Fall or balance concerns reported    No    Tobacco Cessation No Change    Warm-up and Cool-down Performed on first and last piece of equipment    Resistance Training Performed Yes    VAD Patient? No    PAD/SET Patient? No      Pain Assessment   Currently in Pain? No/denies             Social History   Tobacco Use  Smoking Status Former   Current packs/day: 1.50   Average packs/day: 1.5 packs/day for 30.0 years (45.0 ttl pk-yrs)   Types: Cigarettes  Smokeless Tobacco Never  Tobacco Comments   Quit in 2021.    Goals Met:  Proper associated with RPD/PD & O2 Sat Independence with exercise equipment Using PLB without cueing & demonstrates good technique Exercise tolerated well No report of concerns or symptoms today Strength training completed today  Goals Unmet:  Not Applicable  Comments: Pt able to follow exercise prescription today without complaint.  Will continue to monitor for progression.    Dr. Oneil Pinal is Medical Director for [facility] Cardiac Rehabilitation.  Dr. Fuad Aleskerov is Medical Director for [facility] Pulmonary Rehabilitation.

## 2024-04-29 ENCOUNTER — Encounter

## 2024-05-01 ENCOUNTER — Encounter

## 2024-05-01 DIAGNOSIS — R0609 Other forms of dyspnea: Secondary | ICD-10-CM

## 2024-05-01 NOTE — Progress Notes (Signed)
 Daily Session Note  Patient Details  Name: Michelle Acevedo MRN: [id number] Date of Birth: November 25, 1950 Referring Provider:   Flowsheet Row Pulmonary Rehab from 02/16/2024 in [facility] Cardiac and Pulmonary Rehab  Referring Provider Dr. Dickey Orem    Encounter Date: 05/01/2024  Check In:  Session Check In - 05/01/24 1054       Check-In   Supervising physician immediately available to respond to emergencies See telemetry face sheet for immediately available ER MD    Location [facility]-Cardiac & Pulmonary Rehab    Staff Present Burnard Davenport RN,BSN,MPA;Joseph Hood RCP,RRT,BSRT;Noah Tickle, [place], Exercise Physiologist;Jason Elnor RDN,LDN;Laura Cates RN,BSN    Virtual Visit No    Medication changes reported     No    Fall or balance concerns reported    No    Tobacco Cessation No Change    Warm-up and Cool-down Performed on first and last piece of equipment    Resistance Training Performed Yes    VAD Patient? No    PAD/SET Patient? No      Pain Assessment   Currently in Pain? No/denies             Social History   Tobacco Use  Smoking Status Former   Current packs/day: 1.50   Average packs/day: 1.5 packs/day for 30.0 years (45.0 ttl pk-yrs)   Types: Cigarettes  Smokeless Tobacco Never  Tobacco Comments   Quit in 2021.    Goals Met:  Proper associated with RPD/PD & O2 Sat Independence with exercise equipment Using PLB without cueing & demonstrates good technique Exercise tolerated well No report of concerns or symptoms today Strength training completed today  Goals Unmet:  Not Applicable  Comments: Pt able to follow exercise prescription today without complaint.  Will continue to monitor for progression.    Dr. Oneil Pinal is Medical Director for [facility] Cardiac Rehabilitation.  Dr. Fuad Aleskerov is Medical Director for [facility] Pulmonary Rehabilitation.

## 2024-05-03 ENCOUNTER — Encounter

## 2024-05-06 ENCOUNTER — Encounter

## 2024-05-06 DIAGNOSIS — R0609 Other forms of dyspnea: Secondary | ICD-10-CM

## 2024-05-06 NOTE — Progress Notes (Signed)
 Daily Session Note  Patient Details  Name: Michelle Acevedo MRN: [id number] Date of Birth: 1950-11-23 Referring Provider:   Flowsheet Row Pulmonary Rehab from 02/16/2024 in [facility]  Referring Provider Dr. Dickey Orem    Encounter Date: 05/06/2024  Check In:  Session Check In - 05/06/24 1052       Check-In   Supervising physician immediately available to respond to emergencies See telemetry face sheet for immediately available ER MD    Location [facility]-Cardiac & Pul[facility]    Staff Present Burnard Davenport RN,BSN,MPA;Joseph Rolinda RCP,RRT,BSRT;Laura Cates RN,BSN;Donie Lemelin Dyane BS, ACSM CEP, Exercise Physiologist    Virtual Visit No    Medication changes reported     No    Fall or balance concerns reported    No    Tobacco Cessation No Change    Warm-up and Cool-down Performed on first and last piece of equipment    Resistance Training Performed Yes    VAD Patient? No    PAD/SET Patient? No      Pain Assessment   Currently in Pain? No/denies             Social History   Tobacco Use  Smoking Status Former   Current packs/day: 1.50   Average packs/day: 1.5 packs/day for 30.0 years (45.0 ttl pk-yrs)   Types: Cigarettes  Smokeless Tobacco Never  Tobacco Comments   Quit in 2021.    Goals Met:  Proper associated with RPD/PD & O2 Sat Independence with exercise equipment Using PLB without cueing & demonstrates good technique Exercise tolerated well No report of concerns or symptoms today Strength training completed today  Goals Unmet:  Not Applicable  Comments: Pt able to follow exercise prescription today without complaint.  Will continue to monitor for progression.    Dr. Oneil Pinal is Medical Director for [facility] Cardiac Rehabilitation.  Dr. Fuad Aleskerov is Medical Director for [facility] Pulmonary Rehabilitation.

## 2024-05-08 ENCOUNTER — Encounter

## 2024-05-08 DIAGNOSIS — R0609 Other forms of dyspnea: Secondary | ICD-10-CM

## 2024-05-08 NOTE — Progress Notes (Signed)
 Pulmonary Individual Treatment Plan  Patient Details  Name: Michelle Acevedo MRN: [id number] Date of Birth: Aug 04, 1951 Referring Provider:   Flowsheet Row Pulmonary Rehab from 02/16/2024 in [facility] Cardiac and Pulmonary Rehab  Referring Provider Dr. Dickey Orem    Initial Encounter Date:  Flowsheet Row Pulmonary Rehab from 02/16/2024 in [facility] Cardiac and Pulmonary Rehab  Date 02/16/24    Visit Diagnosis: DOE (dyspnea on exertion)  Patient's Home Medications on Admission:  Current Outpatient Medications:    acetaminophen  (TYLENOL ) 325 MG tablet, Take 325 mg by mouth., Disp: , Rfl:    acetaminophen  (TYLENOL ) 500 MG tablet, Take 500 mg by mouth every 6 (six) hours as needed for moderate pain or headache., Disp: , Rfl:    albuterol  (VENTOLIN  HFA) 108 (90 Base) MCG/ACT inhaler, Inhale 1-2 puffs into the lungs every 6 (six) hours as needed for wheezing or shortness of breath. (Patient not taking: Reported on 02/14/2024), Disp: 18 g, Rfl: 0   albuterol  (VENTOLIN  HFA) 108 (90 Base) MCG/ACT inhaler, Inhale 2 puffs into the lungs., Disp: , Rfl:    Albuterol  Sulfate (PROAIR  RESPICLICK) 108 (90 Base) MCG/ACT AEPB, Inhale 2 puffs into the lungs., Disp: , Rfl:    furosemide (LASIX) 40 MG tablet, Take 40 mg by mouth daily.  (Patient not taking: Reported on 02/14/2024), Disp: , Rfl:    Ipratropium-Albuterol  (COMBIVENT) 20-100 MCG/ACT AERS respimat, Inhale 2 puffs into the lungs 3 (three) times daily as needed for shortness of breath., Disp: , Rfl:    lactulose (CHRONULAC) 10 GM/15ML solution, Take 10 g by mouth 3 (three) times daily as needed (for elevated ammonia level)., Disp: , Rfl:    NON FORMULARY, B3, Disp: , Rfl:    oxyCODONE  (ROXICODONE ) 5 MG immediate release tablet, Take 1 tablet (5 mg total) by mouth every 8 (eight) hours as needed for up to 16 doses for breakthrough pain or severe pain. (Patient not taking: Reported on 02/14/2024), Disp: 16 tablet, Rfl: 0   pantoprazole (PROTONIX) 40 MG tablet, Take 40 mg  by mouth every morning.  (Patient not taking: Reported on 02/14/2024), Disp: , Rfl:    pantoprazole (PROTONIX) 40 MG tablet, Take 40 mg by mouth., Disp: , Rfl:    Potassium 99 MG TABS, Take 99 mg by mouth daily., Disp: , Rfl:    rifaximin (XIFAXAN) 550 MG TABS tablet, Take 550 mg by mouth every morning.  (Patient not taking: Reported on 02/14/2024), Disp: , Rfl:    spironolactone (ALDACTONE) 100 MG tablet, Take 100 mg by mouth daily. , Disp: , Rfl:    tacrolimus (PROGRAF) 5 MG capsule, Take 25 mg by mouth 2 (two) times daily., Disp: , Rfl:    traZODone (DESYREL) 50 MG tablet, Take 50 mg by mouth at bedtime., Disp: , Rfl:   Past Medical History: Past Medical History:  Diagnosis Date   Anemia    Arthritis    KNEE KLEFT   Cancer (HCC)    Labia   Chronic hepatitis C with cirrhosis (HCC)    Cirrhosis (HCC)    Emphysema of lung (HCC)    mild   GERD (gastroesophageal reflux disease)    Hepatitis C    History of esophageal varices    Hx of transfusion of whole blood    Macular degeneration    LEFT EYE-RECEIVES INJECTION IN LEFT EYE FOR THIS EVERY 9 WEEKS    Tobacco Use: Social History   Tobacco Use  Smoking Status Former   Current packs/day: 1.50   Average  packs/day: 1.5 packs/day for 30.0 years (45.0 ttl pk-yrs)   Types: Cigarettes  Smokeless Tobacco Never  Tobacco Comments   Quit in 2021.    Labs: Review Flowsheet        No data to display           Pulmonary Assessment Scores:  Pulmonary Assessment Scores     Row Name 02/14/24 1352 02/14/24 1450       ADL UCSD   ADL Phase Entry Entry    SOB Score total -- 3    Rest -- 0    Walk -- 0    Stairs -- 3    Bath -- 0    Dress -- 0    Shop -- 0      CAT Score   CAT Score 21 --       UCSD: Self-administered rating of dyspnea associated with activities of daily living (ADLs) 6-point scale (0 = not at all to 5 = maximal or unable to do because of breathlessness)  Scoring Scores range from 0 to 120.   Minimally important difference is 5 units  CAT: CAT can identify the health impairment of COPD patients and is better correlated with disease progression.  CAT has a scoring range of zero to 40. The CAT score is classified into four groups of low (less than 10), medium (10 - 20), high (21-30) and very high (31-40) based on the impact level of disease on health status. A CAT score over 10 suggests significant symptoms.  A worsening CAT score could be explained by an exacerbation, poor medication adherence, poor inhaler technique, or progression of COPD or comorbid conditions.  CAT MCID is 2 points  mMRC: mMRC (Modified Medical Research Council) Dyspnea Scale is used to assess the degree of baseline functional disability in patients of respiratory disease due to dyspnea. No minimal important difference is established. A decrease in score of 1 point or greater is considered a positive change.   Pulmonary Function Assessment:  Pulmonary Function Assessment - 02/14/24 1341       Breath   Shortness of Breath Yes;Limiting activity          Exercise Target Goals: Exercise Program Goal: Individual exercise prescription set using results from initial 6 min walk test and THRR while considering  patient's activity barriers and safety.   Exercise Prescription Goal: Initial exercise prescription builds to 30-45 minutes a day of aerobic activity, 2-3 days per week.  Home exercise guidelines will be given to patient during program as part of exercise prescription that the participant will acknowledge.  Education: Aerobic Exercise: - Group verbal and visual presentation on the components of exercise prescription. Introduces F.I.T.T principle from ACSM for exercise prescriptions.  Reviews F.I.T.T. principles of aerobic exercise including progression. Written material provided at class time.   Education: Resistance Exercise: - Group verbal and visual presentation on the components of exercise  prescription. Introduces F.I.T.T principle from ACSM for exercise prescriptions  Reviews F.I.T.T. principles of resistance exercise including progression. Written material provided at class time.    Education: Exercise & Equipment Safety: - Individual verbal instruction and demonstration of equipment use and safety with use of the equipment. Flowsheet Row Pulmonary Rehab from 02/14/2024 in [facility] Cardiac and Pulmonary Rehab  Date 02/14/24  Educator [facility]  Instruction Review Code 1- Verbalizes Understanding    Education: Exercise Physiology & General Exercise Guidelines: - Group verbal and written instruction with models to review the exercise physiology of the cardiovascular system and  associated critical values. Provides general exercise guidelines with specific guidelines to those with heart or lung disease.  Flowsheet Row Pulmonary Rehab from 02/14/2024 in [facility] Cardiac and Pulmonary Rehab  Education need identified 02/14/24    Education: Flexibility, Balance, Mind/Body Relaxation: - Group verbal and visual presentation with interactive activity on the components of exercise prescription. Introduces F.I.T.T principle from ACSM for exercise prescriptions. Reviews F.I.T.T. principles of flexibility and balance exercise training including progression. Also discusses the mind body connection.  Reviews various relaxation techniques to help reduce and manage stress (i.e. Deep breathing, progressive muscle relaxation, and visualization). Balance handout provided to take home. Written material provided at class time.   Activity Barriers & Risk Stratification:  Activity Barriers & Cardiac Risk Stratification - 02/16/24 1121       Activity Barriers & Cardiac Risk Stratification   Activity Barriers Joint Problems;Shortness of Breath          6 Minute Walk:  6 Minute Walk     Row Name 02/16/24 1116         6 Minute Walk   Phase Initial     Distance 1480 feet     Walk Time 6 minutes     # of  Rest Breaks 0     MPH 2.8     METS 2.9     RPE 9     Perceived Dyspnea  0     VO2 Peak 10.8     Symptoms No     Resting HR 86 bpm     Resting BP 128/60     Resting Oxygen Saturation  95 %     Exercise Oxygen Saturation  during 6 min walk 92 %     Max Ex. HR 115 bpm     Max Ex. BP 144/68     2 Minute Post BP 124/62       Interval HR   1 Minute HR 100     2 Minute HR 115     3 Minute HR 115     4 Minute HR 71     5 Minute HR 115     6 Minute HR 111     2 Minute Post HR 89     Interval Heart Rate? Yes       Interval Oxygen   Interval Oxygen? Yes     Baseline Oxygen Saturation % 95 %     1 Minute Oxygen Saturation % 95 %     1 Minute Liters of Oxygen 0 L     2 Minute Oxygen Saturation % 98 %     2 Minute Liters of Oxygen 0 L     3 Minute Oxygen Saturation % 93 %     3 Minute Liters of Oxygen 0 L     4 Minute Oxygen Saturation % 92 %     4 Minute Liters of Oxygen 0 L     5 Minute Oxygen Saturation % 97 %     5 Minute Liters of Oxygen 0 L     6 Minute Oxygen Saturation % 97 %     6 Minute Liters of Oxygen 0 L     2 Minute Post Oxygen Saturation % 98 %     2 Minute Post Liters of Oxygen 0 L       Oxygen Initial Assessment:  Oxygen Initial Assessment - 02/14/24 1341       Home Oxygen   Home Oxygen Device None  Sleep Oxygen Prescription None    Home Exercise Oxygen Prescription None    Home Resting Oxygen Prescription None      Initial 6 min Walk   Oxygen Used None      Program Oxygen Prescription   Program Oxygen Prescription None      Intervention   Short Term Goals To learn and exhibit compliance with exercise, home and travel O2 prescription;To learn and understand importance of monitoring SPO2 with pulse oximeter and demonstrate accurate use of the pulse oximeter.;To learn and understand importance of maintaining oxygen saturations>88%;To learn and demonstrate proper pursed lip breathing techniques or other breathing techniques. ;To learn and demonstrate  proper use of respiratory medications    Long  Term Goals Exhibits compliance with exercise, home  and travel O2 prescription;Verbalizes importance of monitoring SPO2 with pulse oximeter and return demonstration;Maintenance of O2 saturations>88%;Exhibits proper breathing techniques, such as pursed lip breathing or other method taught during program session;Compliance with respiratory medication;Demonstrates proper use of MDI's          Oxygen Re-Evaluation:  Oxygen Re-Evaluation     Row Name 02/16/24 1108 03/08/24 1116 04/24/24 1110         Program Oxygen Prescription   Program Oxygen Prescription None None None       Home Oxygen   Home Oxygen Device None None None     Sleep Oxygen Prescription None None None     Home Exercise Oxygen Prescription None None None     Home Resting Oxygen Prescription None None None       Goals/Expected Outcomes   Short Term Goals To learn and exhibit compliance with exercise, home and travel O2 prescription;To learn and understand importance of monitoring SPO2 with pulse oximeter and demonstrate accurate use of the pulse oximeter.;To learn and understand importance of maintaining oxygen saturations>88%;To learn and demonstrate proper pursed lip breathing techniques or other breathing techniques. ;To learn and demonstrate proper use of respiratory medications To learn and demonstrate proper pursed lip breathing techniques or other breathing techniques.  To learn and understand importance of maintaining oxygen saturations>88%;To learn and understand importance of monitoring SPO2 with pulse oximeter and demonstrate accurate use of the pulse oximeter.     Long  Term Goals Exhibits compliance with exercise, home  and travel O2 prescription;Verbalizes importance of monitoring SPO2 with pulse oximeter and return demonstration;Maintenance of O2 saturations>88%;Exhibits proper breathing techniques, such as pursed lip breathing or other method taught during program  session;Compliance with respiratory medication;Demonstrates proper use of MDI's Exhibits proper breathing techniques, such as pursed lip breathing or other method taught during program session Verbalizes importance of monitoring SPO2 with pulse oximeter and return demonstration;Maintenance of O2 saturations>88%     Comments Reviewed PLB technique with pt.  Talked about how it works and it's importance in maintaining their exercise saturations. Informed patient how to perform the Pursed Lipped breathing technique. Told patient to Inhale through the nose and out the mouth with pursed lips to keep their airways open, help oxygenate them better, practice when at rest or doing strenuous activity. Patient Verbalizes understanding of technique and will work on and be reiterated during [facility]. She does not have a pulse oximeter to check her oxygen saturation at home. Informed her where to get one and explained why it is important to have one. Reviewed that oxygen saturations should be 88 percent and above.     Goals/Expected Outcomes Short: Become more profiecient at using PLB. Long: Become independent at using PLB. Short: use  PLB with exertion. Long: use PLB on exertion proficiently and independently. Short: monitor oxygen at home with exertion. Long: maintain oxygen saturations above 88 percent independently.        Oxygen Discharge (Final Oxygen Re-Evaluation):  Oxygen Re-Evaluation - 04/24/24 1110       Program Oxygen Prescription   Program Oxygen Prescription None      Home Oxygen   Home Oxygen Device None    Sleep Oxygen Prescription None    Home Exercise Oxygen Prescription None    Home Resting Oxygen Prescription None      Goals/Expected Outcomes   Short Term Goals To learn and understand importance of maintaining oxygen saturations>88%;To learn and understand importance of monitoring SPO2 with pulse oximeter and demonstrate accurate use of the pulse oximeter.    Long  Term Goals Verbalizes  importance of monitoring SPO2 with pulse oximeter and return demonstration;Maintenance of O2 saturations>88%    Comments She does not have a pulse oximeter to check her oxygen saturation at home. Informed her where to get one and explained why it is important to have one. Reviewed that oxygen saturations should be 88 percent and above.    Goals/Expected Outcomes Short: monitor oxygen at home with exertion. Long: maintain oxygen saturations above 88 percent independently.          Initial Exercise Prescription:  Initial Exercise Prescription - 02/16/24 1100       Date of Initial Exercise RX and Referring Provider   Date 02/16/24    Referring Provider Dr. Dickey Orem      Oxygen   Maintain Oxygen Saturation 88% or higher      Treadmill   MPH 2.8    Grade 0    Minutes 15    METs 2.9      Recumbant Bike   Level 3    RPM 50    Watts 25    Minutes 15    METs 2.9      NuStep   Level 3    SPM 80    Minutes 15    METs 2.9      Rower   Level 1    Watts 25    Minutes 15    METs 2.9      Intensity   THRR 40-80% of Max Heartrate 109-133    Ratings of Perceived Exertion 11-13    Perceived Dyspnea 0-4      Resistance Training   Training Prescription Yes    Weight 3lb    Reps 10-15          Perform Capillary Blood Glucose checks as needed.  Exercise Prescription Changes:   Exercise Prescription Changes     Row Name 02/16/24 1100 02/28/24 1400 03/12/24 1300         Response to Exercise   Blood Pressure (Admit) 128/60 118/72 134/60     Blood Pressure (Exercise) 144/68 136/72 146/60     Blood Pressure (Exit) 124/62 120/68 118/62     Heart Rate (Admit) 86 bpm 79 bpm 85 bpm     Heart Rate (Exercise) 115 bpm 103 bpm 108 bpm     Heart Rate (Exit) 89 bpm 91 bpm 97 bpm     Oxygen Saturation (Admit) 95 % 94 % 96 %     Oxygen Saturation (Exercise) 92 % 93 % 90 %     Oxygen Saturation (Exit) 98 % 96 % 94 %     Rating of Perceived Exertion (Exercise) 9 11 12  Perceived Dyspnea (Exercise) 0 0 1     Symptoms R knee pain none none     Comments results first 2 weeks of exercise --     Duration -- Progress to 30 minutes of  aerobic without signs/symptoms of physical distress Continue with 30 min of aerobic exercise without signs/symptoms of physical distress.     Intensity -- THRR unchanged THRR unchanged       Progression   Progression -- Continue to progress workloads to maintain intensity without signs/symptoms of physical distress. Continue to progress workloads to maintain intensity without signs/symptoms of physical distress.     Average METs -- 2.89 2.94       Resistance Training   Training Prescription -- Yes Yes     Weight -- 3 4 lb     Reps -- 10-15 10-15       Interval Training   Interval Training -- No No       Treadmill   MPH -- 2.3 2.5     Grade -- 0 0     Minutes -- 15 15     METs -- 2.76 2.91       Recumbant Bike   Level -- 2 3     Watts -- 25 30     Minutes -- 15 15     METs -- 3.04 3.03       NuStep   Level -- -- 1  T6 nustep: level 2     Minutes -- -- 15     METs -- -- --  T6 nustep: 3.3 mets       Biostep-RELP   Level -- 1 --     Minutes -- 15 --     METs -- 3 --       Oxygen   Maintain Oxygen Saturation -- 88% or higher 88% or higher        Exercise Comments:   Exercise Comments     Row Name 02/16/24 1107           Exercise Comments First full day of exercise!  Patient was oriented to gym and equipment including functions, settings, policies, and procedures.  Patient's individual exercise prescription and treatment plan were reviewed.  All starting workloads were established based on the results of the 6 minute walk test done at initial orientation visit.  The plan for exercise progression was also introduced and progression will be customized based on patient's performance and goals.          Exercise Goals and Review:   Exercise Goals     Row Name 02/16/24 1130             Exercise  Goals   Increase Physical Activity Yes       Intervention Provide advice, education, support and counseling about physical activity/exercise needs.;Develop an individualized exercise prescription for aerobic and resistive training based on initial evaluation findings, risk stratification, comorbidities and participant's personal goals.       Expected Outcomes Short Term: Attend rehab on a regular basis to increase amount of physical activity.;Long Term: Add in home exercise to make exercise part of routine and to increase amount of physical activity.;Long Term: Exercising regularly at least 3-5 days a week.       Increase Strength and Stamina Yes       Intervention Provide advice, education, support and counseling about physical activity/exercise needs.;Develop an individualized exercise prescription for aerobic and resistive training based on initial evaluation findings,  risk stratification, comorbidities and participant's personal goals.       Expected Outcomes Short Term: Increase workloads from initial exercise prescription for resistance, speed, and METs.;Short Term: Perform resistance training exercises routinely during rehab and add in resistance training at home;Long Term: Improve cardiorespiratory fitness, muscular endurance and strength as measured by increased METs and functional capacity ( )       Able to understand and use rate of perceived exertion (RPE) scale Yes       Intervention Provide education and explanation on how to use RPE scale       Expected Outcomes Short Term: Able to use RPE daily in rehab to express subjective intensity level;Long Term:  Able to use RPE to guide intensity level when exercising independently       Able to understand and use Dyspnea scale Yes       Intervention Provide education and explanation on how to use Dyspnea scale       Expected Outcomes Short Term: Able to use Dyspnea scale daily in rehab to express subjective sense of shortness of breath during  exertion;Long Term: Able to use Dyspnea scale to guide intensity level when exercising independently       Knowledge and understanding of Target Heart Rate Range (THRR) Yes       Intervention Provide education and explanation of THRR including how the numbers were predicted and where they are located for reference       Expected Outcomes Short Term: Able to state/look up THRR;Short Term: Able to use daily as guideline for intensity in rehab;Long Term: Able to use THRR to govern intensity when exercising independently       Able to check pulse independently Yes       Intervention Provide education and demonstration on how to check pulse in carotid and radial arteries.;Review the importance of being able to check your own pulse for safety during independent exercise       Expected Outcomes Short Term: Able to explain why pulse checking is important during independent exercise;Long Term: Able to check pulse independently and accurately       Understanding of Exercise Prescription Yes       Intervention Provide education, explanation, and written materials on patient's individual exercise prescription       Expected Outcomes Short Term: Able to explain program exercise prescription;Long Term: Able to explain home exercise prescription to exercise independently          Exercise Goals Re-Evaluation :  Exercise Goals Re-Evaluation     Row Name 02/16/24 1107 02/28/24 1449 03/12/24 1359 04/25/24 1404       Exercise Goal Re-Evaluation   Exercise Goals Review Able to understand and use rate of perceived exertion (RPE) scale;Knowledge and understanding of Target Heart Rate Range (THRR);Understanding of Exercise Prescription;Increase Physical Activity;Increase Strength and Stamina;Able to understand and use Dyspnea scale;Able to check pulse independently Increase Strength and Stamina;Increase Physical Activity;Understanding of Exercise Prescription Increase Strength and Stamina;Increase Physical  Activity;Understanding of Exercise Prescription Increase Strength and Stamina;Increase Physical Activity;Understanding of Exercise Prescription    Comments Reviewed RPE and dyspnea scale, THR and program prescription with pt today.  Pt voiced understanding and was given a copy of goals to take home. Michelle Acevedo is off to a good start in the program. She was able to attend her first 2 sessions during this review period. During her sessions she was able to use the treadmill at a speed of 2. and no incline, and the recumbent bike at  level 2. We will continue to monitor her progress in the program. Michelle Acevedo is doing well in rehab. She increased her workload on the treadmill to a speed of 2.5 mph with no incline. She also improved to level 3 on the recumbent bike and increased to 4 lb handweights for resistance training. We will continue to monitor her progress in the program. Michelle Acevedo has not attended rehab since the last review due to personal reasons. We will continue to monitor her progress when she returns to the program.    Expected Outcomes Short: Use RPE daily to regulate intensity. Long: Follow program prescription in THR. Short: Continue to follow exercise prescription. Long: Continue exercise to improve strength and stamina. Short: Continue to progressively increase treadmill workload. Long: Continue exercise to improve strength and stamina. Short: Return to rehab when appropriate. Long: Graduate.       Discharge Exercise Prescription (Final Exercise Prescription Changes):  Exercise Prescription Changes - 03/12/24 1300       Response to Exercise   Blood Pressure (Admit) 134/60    Blood Pressure (Exercise) 146/60    Blood Pressure (Exit) 118/62    Heart Rate (Admit) 85 bpm    Heart Rate (Exercise) 108 bpm    Heart Rate (Exit) 97 bpm    Oxygen Saturation (Admit) 96 %    Oxygen Saturation (Exercise) 90 %    Oxygen Saturation (Exit) 94 %    Rating of Perceived Exertion (Exercise) 12    Perceived  Dyspnea (Exercise) 1    Symptoms none    Duration Continue with 30 min of aerobic exercise without signs/symptoms of physical distress.    Intensity THRR unchanged      Progression   Progression Continue to progress workloads to maintain intensity without signs/symptoms of physical distress.    Average METs 2.94      Resistance Training   Training Prescription Yes    Weight 4 lb    Reps 10-15      Interval Training   Interval Training No      Treadmill   MPH 2.5    Grade 0    Minutes 15    METs 2.91      Recumbant Bike   Level 3    Watts 30    Minutes 15    METs 3.03      NuStep   Level 1   T6 nustep: level 2   Minutes 15    METs --   T6 nustep: 3.3 mets     Oxygen   Maintain Oxygen Saturation 88% or higher          Nutrition:  Target Goals: Understanding of nutrition guidelines, daily intake of sodium 1500mg , cholesterol 200mg , calories 30% from fat and 7% or less from saturated fats, daily to have 5 or more servings of fruits and vegetables.  Education: Nutrition 1 -Group instruction provided by verbal, written material, interactive activities, discussions, models, and posters to present general guidelines for heart healthy nutrition including macronutrients, label reading, and promoting whole foods over processed counterparts. Education serves as Pensions consultant of discussion of heart healthy eating for all. Written material provided at class time.     Education: Nutrition 2 -Group instruction provided by verbal, written material, interactive activities, discussions, models, and posters to present general guidelines for heart healthy nutrition including sodium, cholesterol, and saturated fat. Providing guidance of habit forming to improve blood pressure, cholesterol, and body weight. Written material provided at class time.     Biometrics:  Pre Biometrics - 02/16/24 1131       Pre Biometrics   Height 5' 1.5 (1.562 m)    Weight 166 lb 1.6 oz (75.3 kg)     Waist Circumference 39 inches    Hip Circumference 45.5 inches    Waist to Hip Ratio 0.86 %    BMI (Calculated) 30.88    Single Leg Stand 3.5 seconds           Nutrition Therapy Plan and Nutrition Goals:   Nutrition Assessments:  MEDIFICTS Score Key: >=70 Need to make dietary changes  40-70 Heart Healthy Diet <= 40 Therapeutic Level Cholesterol Diet  Flowsheet Row Pulmonary Rehab from 02/16/2024 in [facility] Cardiac [facility] Pulmonary Rehab  Picture Your Plate Total Score on Admission 50   Picture Your Plate Scores: <59 Unhealthy dietary pattern with much room for improvement. 41-50 Dietary pattern unlikely to meet recommendations for good health and room for improvement. 51-60 More healthful dietary pattern, with some room for improvement.  >60 Healthy dietary pattern, although there may be some specific behaviors that could be improved.   Nutrition Goals Re-Evaluation:  Nutrition Goals Re-Evaluation     Row Name 03/08/24 1119 04/26/24 1115           Goals   Comment Patient was informed on why it is important to maintain a balanced diet when dealing with Respiratory issues. Explained that it takes a lot of energy to breath and when they are short of breath often they will need to have a good diet to help keep up with the calories they are expending for breathing. Patient deferred RD appointment.      Expected Outcome Short: Choose and plan snacks accordingly to patients caloric intake to improve breathing. Long: Maintain a diet independently that meets their caloric intake to aid in daily shortness of breath. --         Nutrition Goals Discharge (Final Nutrition Goals Re-Evaluation):  Nutrition Goals Re-Evaluation - 04/26/24 1115       Goals   Comment Patient deferred RD appointment.          Psychosocial: Target Goals: Acknowledge presence or absence of significant depression and/or stress, maximize coping skills, provide positive support system. Participant is able to  verbalize types and ability to use techniques and skills needed for reducing stress and depression.   Education: Stress, Anxiety, and Depression - Group verbal and visual presentation to define topics covered.  Reviews how body is impacted by stress, anxiety, and depression.  Also discusses healthy ways to reduce stress and to treat/manage anxiety and depression.  Written material provided at class time.   Education: Sleep Hygiene -Provides group verbal and written instruction about how sleep can affect your health.  Define sleep hygiene, discuss sleep cycles and impact of sleep habits. Review good sleep hygiene tips.    Initial Review & Psychosocial Screening:  Initial Psych Review & Screening - 02/14/24 1348       Initial Review   Current issues with Current Sleep Concerns      Family Dynamics   Good Support System? Yes    Comments Michelle Acevedo can look to her sisters, boyfriend, niece and friends for support. She takes medication for sleep and does not take anything else for her mood.      Barriers   Psychosocial barriers to participate in program The patient should benefit from training in stress management and relaxation.      Screening Interventions   Interventions To provide support and  resources with identified psychosocial needs;Encouraged to exercise;Provide feedback about the scores to participant    Expected Outcomes Short Term goal: Utilizing psychosocial counselor, staff and physician to assist with identification of specific Stressors or current issues interfering with healing process. Setting desired goal for each stressor or current issue identified.;Long Term Goal: Stressors or current issues are controlled or eliminated.;Short Term goal: Identification and review with participant of any Quality of Life or Depression concerns found by scoring the questionnaire.;Long Term goal: The participant improves quality of Life and PHQ9 Scores as seen by post scores and/or verbalization of  changes          Quality of Life Scores:  Scores of 19 and below usually indicate a poorer quality of life in these areas.  A difference of  2-3 points is a clinically meaningful difference.  A difference of 2-3 points in the total score of the Quality of Life Index has been associated with significant improvement in overall quality of life, self-image, physical symptoms, and general health in studies assessing change in quality of life.  PHQ-9: Review Flowsheet       04/26/2024 03/08/2024 02/14/2024  Depression screen PHQ 2/9  Decreased Interest 0 0 0 0  Down, Depressed, Hopeless 0 0 0 0  PHQ - 2 Score 0 0 0 0  Altered sleeping 2 3 3 3   Tired, decreased energy 3 1 1 1   Change in appetite 3 1 1 1   Feeling bad or failure about yourself  0 0 0 0  Trouble concentrating 0 0 0 0  Moving slowly or fidgety/restless 0 0 0 0  Suicidal thoughts 0 0 0 0  PHQ-9 Score 8 5 5 5   Difficult doing work/chores Not difficult at all Not difficult at all Not difficult at all Not difficult at all    Details       Multiple values from one day are sorted in reverse-chronological order        Interpretation of Total Score  Total Score Depression Severity:  1-4 = Minimal depression, 5-9 = Mild depression, 10-14 = Moderate depression, 15-19 = Moderately severe depression, 20-27 = Severe depression   Psychosocial Evaluation and Intervention:  Psychosocial Evaluation - 02/14/24 1349       Psychosocial Evaluation & Interventions   Interventions Relaxation education;Encouraged to exercise with the program and follow exercise prescription;Stress management education    Comments Michelle Acevedo can look to her sisters, boyfriend, niece and friends for support. She takes medication for sleep and does not take anything else for her mood.    Expected Outcomes Short: Start [facility] to help with mood. Long: Maintain a healthy mental state    Continue Psychosocial Services  Follow up required by staff           Psychosocial Re-Evaluation:  Psychosocial Re-Evaluation     Row Name 03/08/24 1120 04/26/24 1121           Psychosocial Re-Evaluation   Current issues with Current Sleep Concerns Current Sleep Concerns      Comments Reviewed patient health questionnaire (PHQ-9) with patient for follow up. Previously, patients score indicated signs/symptoms of depression.  Reviewed to see if patient is improving symptom wise while in program.  Score stayed the same and patient states that it is because she has trouble sleeping. . Reviewed patient health questionnaire (PHQ-9) with patient for follow up. Previously, patients score indicated signs/symptoms of depression.  Reviewed to see if patient is improving symptom wise while in program.  Score  decreased and patient states that it is because she has trouble sleeping still and is bored. Michelle Acevedo states she had alot of hobbies and wants to do those sorts of things again.      Expected Outcomes Short: Continue to attend [facility] regularly for regular exercise and social engagement. Long: Continue to improve symptoms and manage a positive mental state. Short: work towards a new hobby. Long: maintain a healthy mental state.      Interventions Encouraged to attend Pulmonary Rehabilitation for the exercise Encouraged to attend Pulmonary Rehabilitation for the exercise      Continue Psychosocial Services  Follow up required by staff Follow up required by staff         Psychosocial Discharge (Final Psychosocial Re-Evaluation):  Psychosocial Re-Evaluation - 04/26/24 1121       Psychosocial Re-Evaluation   Current issues with Current Sleep Concerns    Comments Reviewed patient health questionnaire (PHQ-9) with patient for follow up. Previously, patients score indicated signs/symptoms of depression.  Reviewed to see if patient is improving symptom wise while in program.  Score decreased and patient states that it is because she has trouble sleeping still and is  bored. Michelle Acevedo states she had alot of hobbies and wants to do those sorts of things again.    Expected Outcomes Short: work towards a new hobby. Long: maintain a healthy mental state.    Interventions Encouraged to attend Pulmonary Rehabilitation for the exercise    Continue Psychosocial Services  Follow up required by staff          Education: Education Goals: Education classes will be provided on a weekly basis, covering required topics. Participant will state understanding/return demonstration of topics presented.  Learning Barriers/Preferences:  Learning Barriers/Preferences - 02/14/24 1341       Learning Barriers/Preferences   Learning Barriers Sight    Learning Preferences None          General Pulmonary Education Topics:  Infection Prevention: - Provides verbal and written material to individual with discussion of infection control including proper hand washing and proper equipment cleaning during exercise session. Flowsheet Row Pulmonary Rehab from 02/14/2024 in [facility] Cardiac and Pulmonary Rehab  Date 02/14/24  Educator [facility]  Instruction Review Code 1- Verbalizes Understanding    Falls Prevention: - Provides verbal and written material to individual with discussion of falls prevention and safety. Flowsheet Row Pulmonary Rehab from 02/14/2024 in [facility] Cardiac and Pulmonary Rehab  Date 02/14/24  Educator [facility]  Instruction Review Code 1- Verbalizes Understanding    Chronic Lung Disease Review: - Group verbal instruction with posters, models, PowerPoint presentations and videos,  to review new updates, new respiratory medications, new advancements in procedures and treatments. Providing information on websites and 800 numbers for continued self-education. Includes information about supplement oxygen, available portable oxygen systems, continuous and intermittent flow rates, oxygen safety, concentrators, and Medicare reimbursement for oxygen. Explanation of Pulmonary Drugs,  including class, frequency, complications, importance of spacers, rinsing mouth after steroid MDI's, and proper cleaning methods for nebulizers. Review of basic lung anatomy and physiology related to function, structure, and complications of lung disease. Review of risk factors. Discussion about methods for diagnosing sleep apnea and types of masks and machines for OSA. Includes a review of the use of types of environmental controls: home humidity, furnaces, filters, dust mite/pet prevention, HEPA vacuums. Discussion about weather changes, air quality and the benefits of nasal washing. Instruction on Warning signs, infection symptoms, calling MD promptly, preventive modes, and value of vaccinations. Review  of effective airway clearance, coughing and/or vibration techniques. Emphasizing that all should Create an Action Plan. Written material provided at class time. Flowsheet Row Pulmonary Rehab from 02/14/2024 in [facility] Cardiac and Pulmonary Rehab  Education need identified 02/14/24    AED/CPR: - Group verbal and written instruction with the use of models to demonstrate the basic use of the AED with the basic ABC's of resuscitation.    Tests and Procedures:  - Group verbal and visual presentation and models provide information about basic cardiac anatomy and function. Reviews the testing methods done to diagnose heart disease and the outcomes of the test results. Describes the treatment choices: Medical Management, Angioplasty, or Coronary Bypass Surgery for treating various heart conditions including Myocardial Infarction, Angina, Valve Disease, and Cardiac Arrhythmias.  Written material provided at class time.   Medication Safety: - Group verbal and visual instruction to review commonly prescribed medications for heart and lung disease. Reviews the medication, class of the drug, and side effects. Includes the steps to properly store meds and maintain the prescription regimen.  Written material given at  graduation.   Other: -Provides group and verbal instruction on various topics (see comments)   Knowledge Questionnaire Score:  Knowledge Questionnaire Score - 02/14/24 1453       Knowledge Questionnaire Score   Pre Score 14/18           Core Components/Risk Factors/Patient Goals at Admission:  Personal Goals and Risk Factors at Admission - 02/14/24 1342       Core Components/Risk Factors/Patient Goals on Admission    Weight Management Yes;Weight Loss    Intervention Weight Management: Develop a combined nutrition and exercise program designed to reach desired caloric intake, while maintaining appropriate intake of nutrient and fiber, sodium and fats, and appropriate energy expenditure required for the weight goal.;Weight Management: Provide education and appropriate resources to help participant work on and attain dietary goals.;Weight Management/Obesity: Establish reasonable short term and long term weight goals.    Expected Outcomes Short Term: Continue to assess and modify interventions until short term weight is achieved;Long Term: Adherence to nutrition and physical activity/exercise program aimed toward attainment of established weight goal;Weight Loss: Understanding of general recommendations for a balanced deficit meal plan, which promotes 1-2 lb weight loss per week and includes a negative energy balance of [phone] kcal/d;Understanding recommendations for meals to include 15-35% energy as protein, 25-35% energy from fat, 35-60% energy from carbohydrates, less than 200mg  of dietary cholesterol, 20-35 gm of total fiber daily;Understanding of distribution of calorie intake throughout the day with the consumption of 4-5 meals/snacks    Improve shortness of breath with ADL's Yes    Intervention Provide education, individualized exercise plan and daily activity instruction to help decrease symptoms of SOB with activities of daily living.    Expected Outcomes Short Term: Improve  cardiorespiratory fitness to achieve a reduction of symptoms when performing ADLs;Long Term: Be able to perform more ADLs without symptoms or delay the onset of symptoms          Education:Diabetes - Individual verbal and written instruction to review signs/symptoms of diabetes, desired ranges of glucose level fasting, after meals and with exercise. Acknowledge that pre and post exercise glucose checks will be done for 3 sessions at entry of program.   Know Your Numbers and Heart Failure: - Group verbal and visual instruction to discuss disease risk factors for cardiac and pulmonary disease and treatment options.  Reviews associated critical values for Overweight/Obesity, Hypertension, Cholesterol, and Diabetes.  Discusses basics of heart failure: signs/symptoms and treatments.  Introduces Heart Failure Zone chart for action plan for heart failure. Written material provided at class time.   Core Components/Risk Factors/Patient Goals Review:   Goals and Risk Factor Review     Row Name 03/08/24 1118 04/26/24 1124           Core Components/Risk Factors/Patient Goals Review   Personal Goals Review Improve shortness of breath with ADL's Weight Management/Obesity      Review Spoke to patient about their shortness of breath and what they can do to improve. Patient has been informed of breathing techniques when starting the program. Patient is informed to tell staff if they have had any med changes and that certain meds they are taking or not taking can be causing shortness of breath. Michelle Acevedo wants to lose some weight. She feels like she is heavy and wants to reach a weight goal 135lbs. She planes to lose a few pounds in the next few weeks.      Expected Outcomes Short: Attend [facility] regularly to improve shortness of breath with ADL's. Long: maintain independence with ADL's Short: lose a few pounds.Long: reach weight goal.         Core Components/Risk Factors/Patient Goals at Discharge (Final  Review):   Goals and Risk Factor Review - 04/26/24 1124       Core Components/Risk Factors/Patient Goals Review   Personal Goals Review Weight Management/Obesity    Review Michelle Acevedo wants to lose some weight. She feels like she is heavy and wants to reach a weight goal 135lbs. She planes to lose a few pounds in the next few weeks.    Expected Outcomes Short: lose a few pounds.Long: reach weight goal.          ITP Comments:  ITP Comments     Row Name 02/16/24 1107 03/13/24 1100 04/10/24 1008 05/08/24 1036     ITP Comments First full day of exercise!  Patient was oriented to gym and equipment including functions, settings, policies, and procedures.  Patient's individual exercise prescription and treatment plan were reviewed.  All starting workloads were established based on the results of the 6 minute walk test done at initial orientation visit.  The plan for exercise progression was also introduced and progression will be customized based on patient's performance and goals. 30 Day review completed. Medical Director ITP review done, changes made as directed, and signed approval by Medical Director. 30 Day review completed. Medical Director ITP review done, changes made as directed, and signed approval by Medical Director. 30 Day review completed. Medical Director ITP review done, changes made as directed, and signed approval by Medical Director.       Comments: 30 day review

## 2024-05-08 NOTE — Progress Notes (Signed)
 Virtual Visit completed. Patient informed on EP and RD appointment and 6 Minute walk test. Patient also informed of patient health questionnaires on My Chart. Patient Verbalizes understanding. For visist on 02/14/2024.

## 2024-05-10 ENCOUNTER — Encounter

## 2024-05-15 ENCOUNTER — Encounter: Attending: Internal Medicine

## 2024-05-15 ENCOUNTER — Encounter

## 2024-05-15 DIAGNOSIS — R0609 Other forms of dyspnea: Secondary | ICD-10-CM | POA: Insufficient documentation

## 2024-05-17 ENCOUNTER — Encounter

## 2024-05-20 ENCOUNTER — Encounter

## 2024-05-22 ENCOUNTER — Encounter

## 2024-05-24 ENCOUNTER — Encounter

## 2024-05-27 ENCOUNTER — Encounter

## 2024-05-29 ENCOUNTER — Encounter

## 2024-05-31 ENCOUNTER — Encounter

## 2024-06-03 ENCOUNTER — Encounter

## 2024-06-05 ENCOUNTER — Encounter

## 2024-06-05 DIAGNOSIS — R0609 Other forms of dyspnea: Secondary | ICD-10-CM

## 2024-06-05 NOTE — Progress Notes (Signed)
 Pulmonary Individual Treatment Plan  Patient Details  Name: Michelle Acevedo MRN: [id number] Date of Birth: 1951/09/08 Referring Provider:   Flowsheet Row Pulmonary Rehab from 02/16/2024 in [facility] Cardiac and Pulmonary Rehab  Referring Provider Dr. Dickey Orem    Initial Encounter Date:  Flowsheet Row Pulmonary Rehab from 02/16/2024 in [facility] Cardiac and Pulmonary Rehab  Date 02/16/24    Visit Diagnosis: DOE (dyspnea on exertion)  Patient's Home Medications on Admission:  Current Outpatient Medications:    acetaminophen  (TYLENOL ) 325 MG tablet, Take 325 mg by mouth., Disp: , Rfl:    acetaminophen  (TYLENOL ) 500 MG tablet, Take 500 mg by mouth every 6 (six) hours as needed for moderate pain or headache., Disp: , Rfl:    albuterol  (VENTOLIN  HFA) 108 (90 Base) MCG/ACT inhaler, Inhale 1-2 puffs into the lungs every 6 (six) hours as needed for wheezing or shortness of breath. (Patient not taking: Reported on 02/14/2024), Disp: 18 g, Rfl: 0   albuterol  (VENTOLIN  HFA) 108 (90 Base) MCG/ACT inhaler, Inhale 2 puffs into the lungs., Disp: , Rfl:    Albuterol  Sulfate (PROAIR  RESPICLICK) 108 (90 Base) MCG/ACT AEPB, Inhale 2 puffs into the lungs., Disp: , Rfl:    furosemide (LASIX) 40 MG tablet, Take 40 mg by mouth daily.  (Patient not taking: Reported on 02/14/2024), Disp: , Rfl:    Ipratropium-Albuterol  (COMBIVENT) 20-100 MCG/ACT AERS respimat, Inhale 2 puffs into the lungs 3 (three) times daily as needed for shortness of breath., Disp: , Rfl:    lactulose (CHRONULAC) 10 GM/15ML solution, Take 10 g by mouth 3 (three) times daily as needed (for elevated ammonia level)., Disp: , Rfl:    NON FORMULARY, B3, Disp: , Rfl:    oxyCODONE  (ROXICODONE ) 5 MG immediate release tablet, Take 1 tablet (5 mg total) by mouth every 8 (eight) hours as needed for up to 16 doses for breakthrough pain or severe pain. (Patient not taking: Reported on 02/14/2024), Disp: 16 tablet, Rfl: 0   pantoprazole (PROTONIX) 40 MG tablet, Take 40 mg  by mouth every morning.  (Patient not taking: Reported on 02/14/2024), Disp: , Rfl:    pantoprazole (PROTONIX) 40 MG tablet, Take 40 mg by mouth., Disp: , Rfl:    Potassium 99 MG TABS, Take 99 mg by mouth daily., Disp: , Rfl:    rifaximin (XIFAXAN) 550 MG TABS tablet, Take 550 mg by mouth every morning.  (Patient not taking: Reported on 02/14/2024), Disp: , Rfl:    spironolactone (ALDACTONE) 100 MG tablet, Take 100 mg by mouth daily. , Disp: , Rfl:    tacrolimus (PROGRAF) 5 MG capsule, Take 25 mg by mouth 2 (two) times daily., Disp: , Rfl:    traZODone (DESYREL) 50 MG tablet, Take 50 mg by mouth at bedtime., Disp: , Rfl:   Past Medical History: Past Medical History:  Diagnosis Date   Anemia    Arthritis    KNEE KLEFT   Cancer (HCC)    Labia   Chronic hepatitis C with cirrhosis (HCC)    Cirrhosis (HCC)    Emphysema of lung (HCC)    mild   GERD (gastroesophageal reflux disease)    Hepatitis C    History of esophageal varices    Hx of transfusion of whole blood    Macular degeneration    LEFT EYE-RECEIVES INJECTION IN LEFT EYE FOR THIS EVERY 9 WEEKS    Tobacco Use: Social History   Tobacco Use  Smoking Status Former   Current packs/day: 1.50   Average  packs/day: 1.5 packs/day for 30.0 years (45.0 ttl pk-yrs)   Types: Cigarettes  Smokeless Tobacco Never  Tobacco Comments   Quit in 2021.    Labs: Review Flowsheet        No data to display           Pulmonary Assessment Scores:  Pulmonary Assessment Scores     Row Name 02/14/24 1352 02/14/24 1450       ADL UCSD   ADL Phase Entry Entry    SOB Score total -- 3    Rest -- 0    Walk -- 0    Stairs -- 3    Bath -- 0    Dress -- 0    Shop -- 0      CAT Score   CAT Score 21 --       UCSD: Self-administered rating of dyspnea associated with activities of daily living (ADLs) 6-point scale (0 = not at all to 5 = maximal or unable to do because of breathlessness)  Scoring Scores range from 0 to 120.   Minimally important difference is 5 units  CAT: CAT can identify the health impairment of COPD patients and is better correlated with disease progression.  CAT has a scoring range of zero to 40. The CAT score is classified into four groups of low (less than 10), medium (10 - 20), high (21-30) and very high (31-40) based on the impact level of disease on health status. A CAT score over 10 suggests significant symptoms.  A worsening CAT score could be explained by an exacerbation, poor medication adherence, poor inhaler technique, or progression of COPD or comorbid conditions.  CAT MCID is 2 points  mMRC: mMRC (Modified Medical Research Council) Dyspnea Scale is used to assess the degree of baseline functional disability in patients of respiratory disease due to dyspnea. No minimal important difference is established. A decrease in score of 1 point or greater is considered a positive change.   Pulmonary Function Assessment:  Pulmonary Function Assessment - 02/14/24 1341       Breath   Shortness of Breath Yes;Limiting activity          Exercise Target Goals: Exercise Program Goal: Individual exercise prescription set using results from initial 6 min walk test and THRR while considering  patient's activity barriers and safety.   Exercise Prescription Goal: Initial exercise prescription builds to 30-45 minutes a day of aerobic activity, 2-3 days per week.  Home exercise guidelines will be given to patient during program as part of exercise prescription that the participant will acknowledge.  Education: Aerobic Exercise: - Group verbal and visual presentation on the components of exercise prescription. Introduces F.I.T.T principle from ACSM for exercise prescriptions.  Reviews F.I.T.T. principles of aerobic exercise including progression. Written material provided at class time.   Education: Resistance Exercise: - Group verbal and visual presentation on the components of exercise  prescription. Introduces F.I.T.T principle from ACSM for exercise prescriptions  Reviews F.I.T.T. principles of resistance exercise including progression. Written material provided at class time.    Education: Exercise & Equipment Safety: - Individual verbal instruction and demonstration of equipment use and safety with use of the equipment. Flowsheet Row Pulmonary Rehab from 02/14/2024 in [facility] Cardiac and Pulmonary Rehab  Date 02/14/24  Educator [facility]  Instruction Review Code 1- Verbalizes Understanding    Education: Exercise Physiology & General Exercise Guidelines: - Group verbal and written instruction with models to review the exercise physiology of the cardiovascular system and  associated critical values. Provides general exercise guidelines with specific guidelines to those with heart or lung disease.  Flowsheet Row Pulmonary Rehab from 02/14/2024 in [facility] Cardiac [facility] Pulmonary Rehab  Education need identified 02/14/24    Education: Flexibility, Balance, Mind/Body Relaxation: - Group verbal and visual presentation with interactive activity on the components of exercise prescription. Introduces F.I.T.T principle from ACSM for exercise prescriptions. Reviews F.I.T.T. principles of flexibility and balance exercise training including progression. Also discusses the mind body connection.  Reviews various relaxation techniques to help reduce and manage stress (i.e. Deep breathing, progressive muscle relaxation, and visualization). Balance handout provided to take home. Written material provided at class time.   Activity Barriers & Risk Stratification:  Activity Barriers & Cardiac Risk Stratification - 02/16/24 1121       Activity Barriers & Cardiac Risk Stratification   Activity Barriers Joint Problems;Shortness of Breath          6 Minute Walk:  6 Minute Walk     Row Name 02/16/24 1116         6 Minute Walk   Phase Initial     Distance 1480 feet     Walk Time 6 minutes     # of  Rest Breaks 0     MPH 2.8     METS 2.9     RPE 9     Perceived Dyspnea  0     VO2 Peak 10.8     Symptoms No     Resting HR 86 bpm     Resting BP 128/60     Resting Oxygen Saturation  95 %     Exercise Oxygen Saturation  during 6 min walk 92 %     Max Ex. HR 115 bpm     Max Ex. BP 144/68     2 Minute Post BP 124/62       Interval HR   1 Minute HR 100     2 Minute HR 115     3 Minute HR 115     4 Minute HR 71     5 Minute HR 115     6 Minute HR 111     2 Minute Post HR 89     Interval Heart Rate? Yes       Interval Oxygen   Interval Oxygen? Yes     Baseline Oxygen Saturation % 95 %     1 Minute Oxygen Saturation % 95 %     1 Minute Liters of Oxygen 0 L     2 Minute Oxygen Saturation % 98 %     2 Minute Liters of Oxygen 0 L     3 Minute Oxygen Saturation % 93 %     3 Minute Liters of Oxygen 0 L     4 Minute Oxygen Saturation % 92 %     4 Minute Liters of Oxygen 0 L     5 Minute Oxygen Saturation % 97 %     5 Minute Liters of Oxygen 0 L     6 Minute Oxygen Saturation % 97 %     6 Minute Liters of Oxygen 0 L     2 Minute Post Oxygen Saturation % 98 %     2 Minute Post Liters of Oxygen 0 L       Oxygen Initial Assessment:  Oxygen Initial Assessment - 02/14/24 1341       Home Oxygen   Home Oxygen Device None  Sleep Oxygen Prescription None    Home Exercise Oxygen Prescription None    Home Resting Oxygen Prescription None      Initial 6 min Walk   Oxygen Used None      Program Oxygen Prescription   Program Oxygen Prescription None      Intervention   Short Term Goals To learn and exhibit compliance with exercise, home and travel O2 prescription;To learn and understand importance of monitoring SPO2 with pulse oximeter and demonstrate accurate use of the pulse oximeter.;To learn and understand importance of maintaining oxygen saturations>88%;To learn and demonstrate proper pursed lip breathing techniques or other breathing techniques. ;To learn and demonstrate  proper use of respiratory medications    Long  Term Goals Exhibits compliance with exercise, home  and travel O2 prescription;Verbalizes importance of monitoring SPO2 with pulse oximeter and return demonstration;Maintenance of O2 saturations>88%;Exhibits proper breathing techniques, such as pursed lip breathing or other method taught during program session;Compliance with respiratory medication;Demonstrates proper use of MDI's          Oxygen Re-Evaluation:  Oxygen Re-Evaluation     Row Name 02/16/24 1108 03/08/24 1116 04/24/24 1110         Program Oxygen Prescription   Program Oxygen Prescription None None None       Home Oxygen   Home Oxygen Device None None None     Sleep Oxygen Prescription None None None     Home Exercise Oxygen Prescription None None None     Home Resting Oxygen Prescription None None None       Goals/Expected Outcomes   Short Term Goals To learn and exhibit compliance with exercise, home and travel O2 prescription;To learn and understand importance of monitoring SPO2 with pulse oximeter and demonstrate accurate use of the pulse oximeter.;To learn and understand importance of maintaining oxygen saturations>88%;To learn and demonstrate proper pursed lip breathing techniques or other breathing techniques. ;To learn and demonstrate proper use of respiratory medications To learn and demonstrate proper pursed lip breathing techniques or other breathing techniques.  To learn and understand importance of maintaining oxygen saturations>88%;To learn and understand importance of monitoring SPO2 with pulse oximeter and demonstrate accurate use of the pulse oximeter.     Long  Term Goals Exhibits compliance with exercise, home  and travel O2 prescription;Verbalizes importance of monitoring SPO2 with pulse oximeter and return demonstration;Maintenance of O2 saturations>88%;Exhibits proper breathing techniques, such as pursed lip breathing or other method taught during program  session;Compliance with respiratory medication;Demonstrates proper use of MDI's Exhibits proper breathing techniques, such as pursed lip breathing or other method taught during program session Verbalizes importance of monitoring SPO2 with pulse oximeter and return demonstration;Maintenance of O2 saturations>88%     Comments Reviewed PLB technique with pt.  Talked about how it works and it's importance in maintaining their exercise saturations. Informed patient how to perform the Pursed Lipped breathing technique. Told patient to Inhale through the nose and out the mouth with pursed lips to keep their airways open, help oxygenate them better, practice when at rest or doing strenuous activity. Patient Verbalizes understanding of technique and will work on and be reiterated during [facility]. She does not have a pulse oximeter to check her oxygen saturation at home. Informed her where to get one and explained why it is important to have one. Reviewed that oxygen saturations should be 88 percent and above.     Goals/Expected Outcomes Short: Become more profiecient at using PLB. Long: Become independent at using PLB. Short: use  PLB with exertion. Long: use PLB on exertion proficiently and independently. Short: monitor oxygen at home with exertion. Long: maintain oxygen saturations above 88 percent independently.        Oxygen Discharge (Final Oxygen Re-Evaluation):  Oxygen Re-Evaluation - 04/24/24 1110       Program Oxygen Prescription   Program Oxygen Prescription None      Home Oxygen   Home Oxygen Device None    Sleep Oxygen Prescription None    Home Exercise Oxygen Prescription None    Home Resting Oxygen Prescription None      Goals/Expected Outcomes   Short Term Goals To learn and understand importance of maintaining oxygen saturations>88%;To learn and understand importance of monitoring SPO2 with pulse oximeter and demonstrate accurate use of the pulse oximeter.    Long  Term Goals Verbalizes  importance of monitoring SPO2 with pulse oximeter and return demonstration;Maintenance of O2 saturations>88%    Comments She does not have a pulse oximeter to check her oxygen saturation at home. Informed her where to get one and explained why it is important to have one. Reviewed that oxygen saturations should be 88 percent and above.    Goals/Expected Outcomes Short: monitor oxygen at home with exertion. Long: maintain oxygen saturations above 88 percent independently.          Initial Exercise Prescription:  Initial Exercise Prescription - 02/16/24 1100       Date of Initial Exercise RX and Referring Provider   Date 02/16/24    Referring Provider Dr. Dickey Orem      Oxygen   Maintain Oxygen Saturation 88% or higher      Treadmill   MPH 2.8    Grade 0    Minutes 15    METs 2.9      Recumbant Bike   Level 3    RPM 50    Watts 25    Minutes 15    METs 2.9      NuStep   Level 3    SPM 80    Minutes 15    METs 2.9      Rower   Level 1    Watts 25    Minutes 15    METs 2.9      Intensity   THRR 40-80% of Max Heartrate 109-133    Ratings of Perceived Exertion 11-13    Perceived Dyspnea 0-4      Resistance Training   Training Prescription Yes    Weight 3lb    Reps 10-15          Perform Capillary Blood Glucose checks as needed.  Exercise Prescription Changes:   Exercise Prescription Changes     Row Name 02/16/24 1100 02/28/24 1400 03/12/24 1300 05/09/24 1600 05/21/24 0800     Response to Exercise   Blood Pressure (Admit) 128/60 118/72 134/60 104/62 110/72   Blood Pressure (Exercise) 144/68 136/72 146/60 -- --   Blood Pressure (Exit) 124/62 120/68 118/62 110/58 108/58   Heart Rate (Admit) 86 bpm 79 bpm 85 bpm 78 bpm 89 bpm   Heart Rate (Exercise) 115 bpm 103 bpm 108 bpm 112 bpm 112 bpm   Heart Rate (Exit) 89 bpm 91 bpm 97 bpm 80 bpm 79 bpm   Oxygen Saturation (Admit) 95 % 94 % 96 % 95 % 96 %   Oxygen Saturation (Exercise) 92 % 93 % 90 % 93 % 95 %    Oxygen Saturation (Exit) 98 % 96 % 94 % 96 %  94 %   Rating of Perceived Exertion (Exercise) 9 11 12 13 11    Perceived Dyspnea (Exercise) 0 0 1 1 1    Symptoms R knee pain none none none none   Comments results first 2 weeks of exercise -- -- --   Duration -- Progress to 30 minutes of  aerobic without signs/symptoms of physical distress Continue with 30 min of aerobic exercise without signs/symptoms of physical distress. Continue with 30 min of aerobic exercise without signs/symptoms of physical distress. Continue with 30 min of aerobic exercise without signs/symptoms of physical distress.   Intensity -- THRR unchanged THRR unchanged THRR unchanged THRR unchanged     Progression   Progression -- Continue to progress workloads to maintain intensity without signs/symptoms of physical distress. Continue to progress workloads to maintain intensity without signs/symptoms of physical distress. Continue to progress workloads to maintain intensity without signs/symptoms of physical distress. Continue to progress workloads to maintain intensity without signs/symptoms of physical distress.   Average METs -- 2.89 2.94 2.98 4.01     Resistance Training   Training Prescription -- Yes Yes Yes Yes   Weight -- 3 4 lb 4 lb 4 lb   Reps -- 10-15 10-15 10-15 10-15     Interval Training   Interval Training -- No No No No     Treadmill   MPH -- 2.3 2.5 2.5 2.5   Grade -- 0 0 0 0   Minutes -- 15 15 15 15    METs -- 2.76 2.91 2.91 2.91     Recumbant Bike   Level -- 2 3 -- --   Watts -- 25 30 -- --   Minutes -- 15 15 -- --   METs -- 3.04 3.03 -- --     NuStep   Level -- -- 1  T6 nustep: level 2 2 2    Minutes -- -- 15 15 15    METs -- -- --  T6 nustep: 3.3 mets 4 5.1     T5 Nustep   Level -- -- -- 1  T6 --   SPM -- -- -- 80 --   Minutes -- -- -- 15 --   METs -- -- -- 2.2 --     Biostep-RELP   Level -- 1 -- -- --   Minutes -- 15 -- -- --   METs -- 3 -- -- --     Oxygen   Maintain Oxygen  Saturation -- 88% or higher 88% or higher 88% or higher 88% or higher      Exercise Comments:   Exercise Comments     Row Name 02/16/24 1107           Exercise Comments First full day of exercise!  Patient was oriented to gym and equipment including functions, settings, policies, and procedures.  Patient's individual exercise prescription and treatment plan were reviewed.  All starting workloads were established based on the results of the 6 minute walk test done at initial orientation visit.  The plan for exercise progression was also introduced and progression will be customized based on patient's performance and goals.          Exercise Goals and Review:   Exercise Goals     Row Name 02/16/24 1130             Exercise Goals   Increase Physical Activity Yes       Intervention Provide advice, education, support and counseling about physical activity/exercise needs.;Develop an individualized exercise prescription  for aerobic and resistive training based on initial evaluation findings, risk stratification, comorbidities and participant's personal goals.       Expected Outcomes Short Term: Attend rehab on a regular basis to increase amount of physical activity.;Long Term: Add in home exercise to make exercise part of routine and to increase amount of physical activity.;Long Term: Exercising regularly at least 3-5 days a week.       Increase Strength and Stamina Yes       Intervention Provide advice, education, support and counseling about physical activity/exercise needs.;Develop an individualized exercise prescription for aerobic and resistive training based on initial evaluation findings, risk stratification, comorbidities and participant's personal goals.       Expected Outcomes Short Term: Increase workloads from initial exercise prescription for resistance, speed, and METs.;Short Term: Perform resistance training exercises routinely during rehab and add in resistance training at  home;Long Term: Improve cardiorespiratory fitness, muscular endurance and strength as measured by increased METs and functional capacity ( )       Able to understand and use rate of perceived exertion (RPE) scale Yes       Intervention Provide education and explanation on how to use RPE scale       Expected Outcomes Short Term: Able to use RPE daily in rehab to express subjective intensity level;Long Term:  Able to use RPE to guide intensity level when exercising independently       Able to understand and use Dyspnea scale Yes       Intervention Provide education and explanation on how to use Dyspnea scale       Expected Outcomes Short Term: Able to use Dyspnea scale daily in rehab to express subjective sense of shortness of breath during exertion;Long Term: Able to use Dyspnea scale to guide intensity level when exercising independently       Knowledge and understanding of Target Heart Rate Range (THRR) Yes       Intervention Provide education and explanation of THRR including how the numbers were predicted and where they are located for reference       Expected Outcomes Short Term: Able to state/look up THRR;Short Term: Able to use daily as guideline for intensity in rehab;Long Term: Able to use THRR to govern intensity when exercising independently       Able to check pulse independently Yes       Intervention Provide education and demonstration on how to check pulse in carotid and radial arteries.;Review the importance of being able to check your own pulse for safety during independent exercise       Expected Outcomes Short Term: Able to explain why pulse checking is important during independent exercise;Long Term: Able to check pulse independently and accurately       Understanding of Exercise Prescription Yes       Intervention Provide education, explanation, and written materials on patient's individual exercise prescription       Expected Outcomes Short Term: Able to explain program  exercise prescription;Long Term: Able to explain home exercise prescription to exercise independently          Exercise Goals Re-Evaluation :  Exercise Goals Re-Evaluation     Row Name 02/16/24 1107 02/28/24 1449 03/12/24 1359 04/25/24 1404 05/09/24 1640     Exercise Goal Re-Evaluation   Exercise Goals Review Able to understand and use rate of perceived exertion (RPE) scale;Knowledge and understanding of Target Heart Rate Range (THRR);Understanding of Exercise Prescription;Increase Physical Activity;Increase Strength and Stamina;Able to understand and use Dyspnea  scale;Able to check pulse independently Increase Strength and Stamina;Increase Physical Activity;Understanding of Exercise Prescription Increase Strength and Stamina;Increase Physical Activity;Understanding of Exercise Prescription Increase Strength and Stamina;Increase Physical Activity;Understanding of Exercise Prescription Increase Strength and Stamina;Increase Physical Activity;Understanding of Exercise Prescription   Comments Reviewed RPE and dyspnea scale, THR and program prescription with pt today.  Pt voiced understanding and was given a copy of goals to take home. Michelle Acevedo is off to a good start in the program. She was able to attend her first 2 sessions during this review period. During her sessions she was able to use the treadmill at a speed of 2. and no incline, and the recumbent bike at level 2. We will continue to monitor her progress in the program. Michelle Acevedo is doing well in rehab. She increased her workload on the treadmill to a speed of 2.5 mph with no incline. She also improved to level 3 on the recumbent bike and increased to 4 lb handweights for resistance training. We will continue to monitor her progress in the program. Michelle Acevedo has not attended rehab since the last review due to personal reasons. We will continue to monitor her progress when she returns to the program. Michelle Acevedo is doing well in rehab. She returned to rehab in this  review. She maintained a speed of 2.5 mph on the treadmill with no incline. She increased to level 2 on the T4 nustep. She maintained level 1 on the T6 nustep. We will continue to monitor her progress in the program.   Expected Outcomes Short: Use RPE daily to regulate intensity. Long: Follow program prescription in THR. Short: Continue to follow exercise prescription. Long: Continue exercise to improve strength and stamina. Short: Continue to progressively increase treadmill workload. Long: Continue exercise to improve strength and stamina. Short: Return to rehab when appropriate. Long: Graduate. Short: Continue to progressively increase treadmill workload. Long: Continue exercise to improve strength and stamina.    Row Name 05/21/24 9164 06/04/24 1514           Exercise Goal Re-Evaluation   Exercise Goals Review Increase Strength and Stamina;Increase Physical Activity;Understanding of Exercise Prescription Increase Strength and Stamina;Increase Physical Activity;Understanding of Exercise Prescription      Comments Michelle Acevedo has only attended one session since the last review. During her one session she continued to work at level 2 on the T4 nustep. She also has maintained her treadmill workload at a speed of 2.5 mph with no incline. We will continue to monitor her progress in the program. Michelle Acevedo has not attended rehab since the last review. She last attended rehab on 05/06/2024. We will reach out to her to see when she plans to return. We will continue to monitor her progress when she returns to the program.      Expected Outcomes Short: Attend rehab more consistently. Long: Continue exercise to improve strength and stamina. Short: Return to rehab when appropriate. Long: Graduate.         Discharge Exercise Prescription (Final Exercise Prescription Changes):  Exercise Prescription Changes - 05/21/24 0800       Response to Exercise   Blood Pressure (Admit) 110/72    Blood Pressure (Exit) 108/58     Heart Rate (Admit) 89 bpm    Heart Rate (Exercise) 112 bpm    Heart Rate (Exit) 79 bpm    Oxygen Saturation (Admit) 96 %    Oxygen Saturation (Exercise) 95 %    Oxygen Saturation (Exit) 94 %    Rating of Perceived Exertion (Exercise)  11    Perceived Dyspnea (Exercise) 1    Symptoms none    Duration Continue with 30 min of aerobic exercise without signs/symptoms of physical distress.    Intensity THRR unchanged      Progression   Progression Continue to progress workloads to maintain intensity without signs/symptoms of physical distress.    Average METs 4.01      Resistance Training   Training Prescription Yes    Weight 4 lb    Reps 10-15      Interval Training   Interval Training No      Treadmill   MPH 2.5    Grade 0    Minutes 15    METs 2.91      NuStep   Level 2    Minutes 15    METs 5.1      Oxygen   Maintain Oxygen Saturation 88% or higher          Nutrition:  Target Goals: Understanding of nutrition guidelines, daily intake of sodium 1500mg , cholesterol 200mg , calories 30% from fat and 7% or less from saturated fats, daily to have 5 or more servings of fruits and vegetables.  Education: Nutrition 1 -Group instruction provided by verbal, written material, interactive activities, discussions, models, and posters to present general guidelines for heart healthy nutrition including macronutrients, label reading, and promoting whole foods over processed counterparts. Education serves as Pensions consultant of discussion of heart healthy eating for all. Written material provided at class time.     Education: Nutrition 2 -Group instruction provided by verbal, written material, interactive activities, discussions, models, and posters to present general guidelines for heart healthy nutrition including sodium, cholesterol, and saturated fat. Providing guidance of habit forming to improve blood pressure, cholesterol, and body weight. Written material provided at class  time.     Biometrics:  Pre Biometrics - 02/16/24 1131       Pre Biometrics   Height 5' 1.5 (1.562 m)    Weight 166 lb 1.6 oz (75.3 kg)    Waist Circumference 39 inches    Hip Circumference 45.5 inches    Waist to Hip Ratio 0.86 %    BMI (Calculated) 30.88    Single Leg Stand 3.5 seconds           Nutrition Therapy Plan and Nutrition Goals:   Nutrition Assessments:  MEDIFICTS Score Key: >=70 Need to make dietary changes  40-70 Heart Healthy Diet <= 40 Therapeutic Level Cholesterol Diet  Flowsheet Row Pulmonary Rehab from 02/16/2024 in [facility] Pulmonary Re[facility]  Picture Your Plate Total Score on Admission 50   Picture Your Plate Scores: <59 Unhealthy dietary pattern with much room for improvement. 41-50 Dietary pattern unlikely to meet recommendations for good health and room for improvement. 51-60 More healthful dietary pattern, with some room for improvement.  >60 Healthy dietary pattern, although there may be some specific behaviors that could be improved.   Nutrition Goals Re-Evaluation:  Nutrition Goals Re-Evaluation     Row Name 03/08/24 1119 04/26/24 1115           Goals   Comment Patient was informed on why it is important to maintain a balanced diet when dealing with Respiratory issues. Explained that it takes a lot of energy to breath and when they are short of breath often they will need to have a good diet to help keep up with the calories they are expending for breathing. Patient deferred RD appointment.      Expected  Outcome Short: Choose and plan snacks accordingly to patients caloric intake to improve breathing. Long: Maintain a diet independently that meets their caloric intake to aid in daily shortness of breath. --         Nutrition Goals Discharge (Final Nutrition Goals Re-Evaluation):  Nutrition Goals Re-Evaluation - 04/26/24 1115       Goals   Comment Patient deferred RD appointment.          Psychosocial: Target Goals:  Acknowledge presence or absence of significant depression and/or stress, maximize coping skills, provide positive support system. Participant is able to verbalize types and ability to use techniques and skills needed for reducing stress and depression.   Education: Stress, Anxiety, and Depression - Group verbal and visual presentation to define topics covered.  Reviews how body is impacted by stress, anxiety, and depression.  Also discusses healthy ways to reduce stress and to treat/manage anxiety and depression.  Written material provided at class time.   Education: Sleep Hygiene -Provides group verbal and written instruction about how sleep can affect your health.  Define sleep hygiene, discuss sleep cycles and impact of sleep habits. Review good sleep hygiene tips.    Initial Review & Psychosocial Screening:  Initial Psych Review & Screening - 02/14/24 1348       Initial Review   Current issues with Current Sleep Concerns      Family Dynamics   Good Support System? Yes    Comments Michelle Acevedo can look to her sisters, boyfriend, niece and friends for support. She takes medication for sleep and does not take anything else for her mood.      Barriers   Psychosocial barriers to participate in program The patient should benefit from training in stress management and relaxation.      Screening Interventions   Interventions To provide support and resources with identified psychosocial needs;Encouraged to exercise;Provide feedback about the scores to participant    Expected Outcomes Short Term goal: Utilizing psychosocial counselor, staff and physician to assist with identification of specific Stressors or current issues interfering with healing process. Setting desired goal for each stressor or current issue identified.;Long Term Goal: Stressors or current issues are controlled or eliminated.;Short Term goal: Identification and review with participant of any Quality of Life or Depression concerns  found by scoring the questionnaire.;Long Term goal: The participant improves quality of Life and PHQ9 Scores as seen by post scores and/or verbalization of changes          Quality of Life Scores:  Scores of 19 and below usually indicate a poorer quality of life in these areas.  A difference of  2-3 points is a clinically meaningful difference.  A difference of 2-3 points in the total score of the Quality of Life Index has been associated with significant improvement in overall quality of life, self-image, physical symptoms, and general health in studies assessing change in quality of life.  PHQ-9: Review Flowsheet       04/26/2024 03/08/2024 02/14/2024  Depression screen PHQ 2/9  Decreased Interest 0 0 0 0  Down, Depressed, Hopeless 0 0 0 0  PHQ - 2 Score 0 0 0 0  Altered sleeping 2 3 3 3   Tired, decreased energy 3 1 1 1   Change in appetite 3 1 1 1   Feeling bad or failure about yourself  0 0 0 0  Trouble concentrating 0 0 0 0  Moving slowly or fidgety/restless 0 0 0 0  Suicidal thoughts 0 0 0 0  PHQ-9 Score 8 5 5 5   Difficult doing work/chores Not difficult at all Not difficult at all Not difficult at all Not difficult at all    Details       Multiple values from one day are sorted in reverse-chronological order        Interpretation of Total Score  Total Score Depression Severity:  1-4 = Minimal depression, 5-9 = Mild depression, 10-14 = Moderate depression, 15-19 = Moderately severe depression, 20-27 = Severe depression   Psychosocial Evaluation and Intervention:  Psychosocial Evaluation - 02/14/24 1349       Psychosocial Evaluation & Interventions   Interventions Relaxation education;Encouraged to exercise with the program and follow exercise prescription;Stress management education    Comments Michelle Acevedo can look to her sisters, boyfriend, niece and friends for support. She takes medication for sleep and does not take anything else for her mood.    Expected Outcomes Short:  Start [facility] to help with mood. Long: Maintain a healthy mental state    Continue Psychosocial Services  Follow up required by staff          Psychosocial Re-Evaluation:  Psychosocial Re-Evaluation     Row Name 03/08/24 1120 04/26/24 1121           Psychosocial Re-Evaluation   Current issues with Current Sleep Concerns Current Sleep Concerns      Comments Reviewed patient health questionnaire (PHQ-9) with patient for follow up. Previously, patients score indicated signs/symptoms of depression.  Reviewed to see if patient is improving symptom wise while in program.  Score stayed the same and patient states that it is because she has trouble sleeping. . Reviewed patient health questionnaire (PHQ-9) with patient for follow up. Previously, patients score indicated signs/symptoms of depression.  Reviewed to see if patient is improving symptom wise while in program.  Score decreased and patient states that it is because she has trouble sleeping still and is bored. Sayana states she had alot of hobbies and wants to do those sorts of things again.      Expected Outcomes Short: Continue to attend [facility] regularly for regular exercise and social engagement. Long: Continue to improve symptoms and manage a positive mental state. Short: work towards a new hobby. Long: maintain a healthy mental state.      Interventions Encouraged to attend Pulmonary Rehabilitation for the exercise Encouraged to attend Pulmonary Rehabilitation for the exercise      Continue Psychosocial Services  Follow up required by staff Follow up required by staff         Psychosocial Discharge (Final Psychosocial Re-Evaluation):  Psychosocial Re-Evaluation - 04/26/24 1121       Psychosocial Re-Evaluation   Current issues with Current Sleep Concerns    Comments Reviewed patient health questionnaire (PHQ-9) with patient for follow up. Previously, patients score indicated signs/symptoms of depression.  Reviewed to see if  patient is improving symptom wise while in program.  Score decreased and patient states that it is because she has trouble sleeping still and is bored. Ludene states she had alot of hobbies and wants to do those sorts of things again.    Expected Outcomes Short: work towards a new hobby. Long: maintain a healthy mental state.    Interventions Encouraged to attend Pulmonary Rehabilitation for the exercise    Continue Psychosocial Services  Follow up required by staff          Education: Education Goals: Education classes will be provided on a weekly basis, covering required topics. Participant  will state understanding/return demonstration of topics presented.  Learning Barriers/Preferences:  Learning Barriers/Preferences - 02/14/24 1341       Learning Barriers/Preferences   Learning Barriers Sight    Learning Preferences None          General Pulmonary Education Topics:  Infection Prevention: - Provides verbal and written material to individual with discussion of infection control including proper hand washing and proper equipment cleaning during exercise session. Flowsheet Row Pulmonary Rehab from 02/14/2024 in [facility] Cardiac and Pulmonary Rehab  Date 02/14/24  Educator [facility]  Instruction Review Code 1- Verbalizes Understanding    Falls Prevention: - Provides verbal and written material to individual with discussion of falls prevention and safety. Flowsheet Row Pulmonary Rehab from 02/14/2024 in [facility] Cardiac and Pulmonary Rehab  Date 02/14/24  Educator [facility]  Instruction Review Code 1- Verbalizes Understanding    Chronic Lung Disease Review: - Group verbal instruction with posters, models, PowerPoint presentations and videos,  to review new updates, new respiratory medications, new advancements in procedures and treatments. Providing information on websites and 800 numbers for continued self-education. Includes information about supplement oxygen, available portable oxygen systems,  continuous and intermittent flow rates, oxygen safety, concentrators, and Medicare reimbursement for oxygen. Explanation of Pulmonary Drugs, including class, frequency, complications, importance of spacers, rinsing mouth after steroid MDI's, and proper cleaning methods for nebulizers. Review of basic lung anatomy and physiology related to function, structure, and complications of lung disease. Review of risk factors. Discussion about methods for diagnosing sleep apnea and types of masks and machines for OSA. Includes a review of the use of types of environmental controls: home humidity, furnaces, filters, dust mite/pet prevention, HEPA vacuums. Discussion about weather changes, air quality and the benefits of nasal washing. Instruction on Warning signs, infection symptoms, calling MD promptly, preventive modes, and value of vaccinations. Review of effective airway clearance, coughing and/or vibration techniques. Emphasizing that all should Create an Action Plan. Written material provided at class time. Flowsheet Row Pulmonary Rehab from 02/14/2024 in [facility] Cardiac and Pulmonary Rehab  Education need identified 02/14/24    AED/CPR: - Group verbal and written instruction with the use of models to demonstrate the basic use of the AED with the basic ABC's of resuscitation.    Tests and Procedures:  - Group verbal and visual presentation and models provide information about basic cardiac anatomy and function. Reviews the testing methods done to diagnose heart disease and the outcomes of the test results. Describes the treatment choices: Medical Management, Angioplasty, or Coronary Bypass Surgery for treating various heart conditions including Myocardial Infarction, Angina, Valve Disease, and Cardiac Arrhythmias.  Written material provided at class time.   Medication Safety: - Group verbal and visual instruction to review commonly prescribed medications for heart and lung disease. Reviews the medication, class  of the drug, and side effects. Includes the steps to properly store meds and maintain the prescription regimen.  Written material given at graduation.   Other: -Provides group and verbal instruction on various topics (see comments)   Knowledge Questionnaire Score:  Knowledge Questionnaire Score - 02/14/24 1453       Knowledge Questionnaire Score   Pre Score 14/18           Core Components/Risk Factors/Patient Goals at Admission:  Personal Goals and Risk Factors at Admission - 02/14/24 1342       Core Components/Risk Factors/Patient Goals on Admission    Weight Management Yes;Weight Loss    Intervention Weight Management: Develop a combined nutrition and exercise  program designed to reach desired caloric intake, while maintaining appropriate intake of nutrient and fiber, sodium and fats, and appropriate energy expenditure required for the weight goal.;Weight Management: Provide education and appropriate resources to help participant work on and attain dietary goals.;Weight Management/Obesity: Establish reasonable short term and long term weight goals.    Expected Outcomes Short Term: Continue to assess and modify interventions until short term weight is achieved;Long Term: Adherence to nutrition and physical activity/exercise program aimed toward attainment of established weight goal;Weight Loss: Understanding of general recommendations for a balanced deficit meal plan, which promotes 1-2 lb weight loss per week and includes a negative energy balance of [id number] kcal/d;Understanding recommendations for meals to include 15-35% energy as protein, 25-35% energy from fat, 35-60% energy from carbohydrates, less than 200mg  of dietary cholesterol, 20-35 gm of total fiber daily;Understanding of distribution of calorie intake throughout the day with the consumption of 4-5 meals/snacks    Improve shortness of breath with ADL's Yes    Intervention Provide education, individualized exercise plan and  daily activity instruction to help decrease symptoms of SOB with activities of daily living.    Expected Outcomes Short Term: Improve cardiorespiratory fitness to achieve a reduction of symptoms when performing ADLs;Long Term: Be able to perform more ADLs without symptoms or delay the onset of symptoms          Education:Diabetes - Individual verbal and written instruction to review signs/symptoms of diabetes, desired ranges of glucose level fasting, after meals and with exercise. Acknowledge that pre and post exercise glucose checks will be done for 3 sessions at entry of program.   Know Your Numbers and Heart Failure: - Group verbal and visual instruction to discuss disease risk factors for cardiac and pulmonary disease and treatment options.  Reviews associated critical values for Overweight/Obesity, Hypertension, Cholesterol, and Diabetes.  Discusses basics of heart failure: signs/symptoms and treatments.  Introduces Heart Failure Zone chart for action plan for heart failure. Written material provided at class time.   Core Components/Risk Factors/Patient Goals Review:   Goals and Risk Factor Review     Row Name 03/08/24 1118 04/26/24 1124           Core Components/Risk Factors/Patient Goals Review   Personal Goals Review Improve shortness of breath with ADL's Weight Management/Obesity      Review Spoke to patient about their shortness of breath and what they can do to improve. Patient has been informed of breathing techniques when starting the program. Patient is informed to tell staff if they have had any med changes and that certain meds they are taking or not taking can be causing shortness of breath. Michelle Acevedo wants to lose some weight. She feels like she is heavy and wants to reach a weight goal 135lbs. She planes to lose a few pounds in the next few weeks.      Expected Outcomes Short: Attend [facility] regularly to improve shortness of breath with ADL's. Long: maintain independence  with ADL's Short: lose a few pounds.Long: reach weight goal.         Core Components/Risk Factors/Patient Goals at Discharge (Final Review):   Goals and Risk Factor Review - 04/26/24 1124       Core Components/Risk Factors/Patient Goals Review   Personal Goals Review Weight Management/Obesity    Review Michelle Acevedo wants to lose some weight. She feels like she is heavy and wants to reach a weight goal 135lbs. She planes to lose a few pounds in the next few weeks.  Expected Outcomes Short: lose a few pounds.Long: reach weight goal.          ITP Comments:  ITP Comments     Row Name 02/16/24 1107 03/13/24 1100 04/10/24 1008 05/08/24 1036 05/08/24 1042   ITP Comments First full day of exercise!  Patient was oriented to gym and equipment including functions, settings, policies, and procedures.  Patient's individual exercise prescription and treatment plan were reviewed.  All starting workloads were established based on the results of the 6 minute walk test done at initial orientation visit.  The plan for exercise progression was also introduced and progression will be customized based on patient's performance and goals. 30 Day review completed. Medical Director ITP review done, changes made as directed, and signed approval by Medical Director. 30 Day review completed. Medical Director ITP review done, changes made as directed, and signed approval by Medical Director. 30 Day review completed. Medical Director ITP review done, changes made as directed, and signed approval by Medical Director. Virtual Visit completed. Patient informed on EP and RD appointment and 6 Minute walk test. Patient also informed of patient health questionnaires on My Chart. Patient Verbalizes understanding. For visist on 02/14/2024.    Row Name 06/05/24 1116           ITP Comments 30 Day review completed. Medical Director ITP review done; changes made as directed and signed approval by Medical Director. New to program.           Comments: 30 day review

## 2024-06-07 ENCOUNTER — Encounter

## 2024-06-10 ENCOUNTER — Telehealth: Payer: Self-pay | Admitting: Emergency Medicine

## 2024-06-10 ENCOUNTER — Encounter

## 2024-06-10 NOTE — Telephone Encounter (Signed)
 Michelle Acevedo reports that she has been on vacation and has now tested positive for COVID. Requested to be taken off the schedule for this week, however plans to return on Monday 10/6 if her symptoms have resolved.

## 2024-06-12 ENCOUNTER — Encounter

## 2024-06-14 ENCOUNTER — Encounter

## 2024-06-17 ENCOUNTER — Encounter: Attending: Internal Medicine

## 2024-06-17 DIAGNOSIS — R0609 Other forms of dyspnea: Secondary | ICD-10-CM | POA: Insufficient documentation

## 2024-06-17 NOTE — Progress Notes (Signed)
 Daily Session Note  Patient Details  Name: Michelle Acevedo MRN: [id number] Date of Birth: [birth year] Referring Provider:   Flowsheet Row Pulmonary Rehab from 02/16/2024 in [facility] Cardiac and Pulmonary Rehab  Referring Provider Dr. Dickey Orem    Encounter Date: 06/17/2024  Check In:  Session Check In - 06/17/24 1057       Check-In   Supervising physician immediately available to respond to emergencies See telemetry face sheet for immediately available ER MD    Location [facility]-Cardiac & Pulmonary Rehab    Staff Present Burnard Davenport RN,BSN,MPA;Joseph Rolinda RCP,RRT,BSRT;Laura Cates RN,BSN;Burnadette Baskett Dyane BS, ACSM CEP, Exercise Physiologist    Virtual Visit No    Medication changes reported     No    Fall or balance concerns reported    No    Tobacco Cessation No Change    Warm-up and Cool-down Performed on first and last piece of equipment    Resistance Training Performed Yes    VAD Patient? No    PAD/SET Patient? No      Pain Assessment   Currently in Pain? No/denies             Social History   Tobacco Use  Smoking Status Former   Current packs/day: 1.50   Average packs/day: 1.5 packs/day for 30.0 years (45.0 ttl pk-yrs)   Types: Cigarettes  Smokeless Tobacco Never  Tobacco Comments   Quit in 2021.    Goals Met:  Proper associated with RPD/PD & O2 Sat Independence with exercise equipment Using PLB without cueing & demonstrates good technique Exercise tolerated well No report of concerns or symptoms today Strength training completed today  Goals Unmet:  Not Applicable  Comments: Pt able to follow exercise prescription today without complaint.  Will continue to monitor for progression.    Dr. Oneil Pinal is Medical Director for [facility] Cardiac Rehabilitation.  Dr. Fuad Aleskerov is Medical Director for [facility] Pulmonary Rehabilitation.

## 2024-06-19 ENCOUNTER — Encounter

## 2024-06-19 DIAGNOSIS — R0609 Other forms of dyspnea: Secondary | ICD-10-CM

## 2024-06-19 NOTE — Progress Notes (Signed)
 Daily Session Note  Patient Details  Name: Michelle Acevedo MRN: [id number] Date of Birth: 07/20/1951 Referring Provider:   Flowsheet Row Pulmonary Rehab from 02/16/2024 in [facility]  Referring Provider Dr. Dickey Orem    Encounter Date: 06/19/2024  Check In:  Session Check In - 06/19/24 1104       Check-In   Supervising physician immediately available to respond to emergencies See telemetry face sheet for immediately available ER MD    Location [facility]-Cardiac & Pulmonary Rehab    Staff Present Burnard Davenport [facility] Peggi, RN, DNP, NE-BC;[facility] RN,BSN;Margaret Best, MS, Exercise Physiologist;Javarion Douty Dyane BS, ACSM CEP, Exercise Physiologist    Virtual Visit No    Medication changes reported     No    Fall or balance concerns reported    No    Tobacco Cessation No Change    Warm-up and Cool-down Performed on first and last piece of equipment    Resistance Training Performed Yes    VAD Patient? No    PAD/SET Patient? No      Pain Assessment   Currently in Pain? No/denies             Social History   Tobacco Use  Smoking Status Former   Current packs/day: 1.50   Average packs/day: 1.5 packs/day for 30.0 years (45.0 ttl pk-yrs)   Types: Cigarettes  Smokeless Tobacco Never  Tobacco Comments   Quit in 2021.    Goals Met:  Proper associated with RPD/PD & O2 Sat Independence with exercise equipment Using PLB without cueing & demonstrates good technique Exercise tolerated well No report of concerns or symptoms today Strength training completed today  Goals Unmet:  Not Applicable  Comments: Pt able to follow exercise prescription today without complaint.  Will continue to monitor for progression.    Dr. Oneil Pinal is Medical Director for [facility] Card[facility].  Dr. Fuad Aleskerov is Medical Director for [facility].

## 2024-06-21 ENCOUNTER — Encounter

## 2024-06-24 ENCOUNTER — Encounter

## 2024-06-26 ENCOUNTER — Encounter

## 2024-06-28 ENCOUNTER — Encounter

## 2024-07-01 ENCOUNTER — Encounter

## 2024-07-03 ENCOUNTER — Encounter: Payer: Self-pay | Admitting: *Deleted

## 2024-07-03 ENCOUNTER — Encounter

## 2024-07-03 DIAGNOSIS — R0609 Other forms of dyspnea: Secondary | ICD-10-CM

## 2024-07-03 NOTE — Progress Notes (Signed)
 Pulmonary Individual Treatment Plan  Patient Details  Name: ANIJA BRICKNER MRN: [id number] Date of Birth: [birth year] Referring Provider:   Flowsheet Row Pulmonary Rehab from 02/16/2024 in [facility] Cardiac and Pulmonary Rehab  Referring Provider Dr. Dickey Orem    Initial Encounter Date:  Flowsheet Row Pulmonary Rehab from 02/16/2024 in [facility] Cardiac and Pulmonary Rehab  Date 02/16/24    Visit Diagnosis: DOE (dyspnea on exertion)  Patient's Home Medications on Admission:  Current Outpatient Medications:    acetaminophen  (TYLENOL ) 325 MG tablet, Take 325 mg by mouth., Disp: , Rfl:    acetaminophen  (TYLENOL ) 500 MG tablet, Take 500 mg by mouth every 6 (six) hours as needed for moderate pain or headache., Disp: , Rfl:    albuterol  (VENTOLIN  HFA) 108 (90 Base) MCG/ACT inhaler, Inhale 1-2 puffs into the lungs every 6 (six) hours as needed for wheezing or shortness of breath. (Patient not taking: Reported on 02/14/2024), Disp: 18 g, Rfl: 0   albuterol  (VENTOLIN  HFA) 108 (90 Base) MCG/ACT inhaler, Inhale 2 puffs into the lungs., Disp: , Rfl:    Albuterol  Sulfate (PROAIR  RESPICLICK) 108 (90 Base) MCG/ACT AEPB, Inhale 2 puffs into the lungs., Disp: , Rfl:    furosemide (LASIX) 40 MG tablet, Take 40 mg by mouth daily.  (Patient not taking: Reported on 02/14/2024), Disp: , Rfl:    Ipratropium-Albuterol  (COMBIVENT) 20-100 MCG/ACT AERS respimat, Inhale 2 puffs into the lungs 3 (three) times daily as needed for shortness of breath., Disp: , Rfl:    lactulose (CHRONULAC) 10 GM/15ML solution, Take 10 g by mouth 3 (three) times daily as needed (for elevated ammonia level)., Disp: , Rfl:    NON FORMULARY, B3, Disp: , Rfl:    oxyCODONE  (ROXICODONE ) 5 MG immediate release tablet, Take 1 tablet (5 mg total) by mouth every 8 (eight) hours as needed for up to 16 doses for breakthrough pain or severe pain. (Patient not taking: Reported on 02/14/2024), Disp: 16 tablet, Rfl: 0   pantoprazole (PROTONIX) 40 MG tablet, Take 40 mg  by mouth every morning.  (Patient not taking: Reported on 02/14/2024), Disp: , Rfl:    pantoprazole (PROTONIX) 40 MG tablet, Take 40 mg by mouth., Disp: , Rfl:    Potassium 99 MG TABS, Take 99 mg by mouth daily., Disp: , Rfl:    rifaximin (XIFAXAN) 550 MG TABS tablet, Take 550 mg by mouth every morning.  (Patient not taking: Reported on 02/14/2024), Disp: , Rfl:    spironolactone (ALDACTONE) 100 MG tablet, Take 100 mg by mouth daily. , Disp: , Rfl:    tacrolimus (PROGRAF) 5 MG capsule, Take 25 mg by mouth 2 (two) times daily., Disp: , Rfl:    traZODone (DESYREL) 50 MG tablet, Take 50 mg by mouth at bedtime., Disp: , Rfl:   Past Medical History: Past Medical History:  Diagnosis Date   Anemia    Arthritis    KNEE KLEFT   Cancer (HCC)    Labia   Chronic hepatitis C with cirrhosis (HCC)    Cirrhosis (HCC)    Emphysema of lung (HCC)    mild   GERD (gastroesophageal reflux disease)    Hepatitis C    History of esophageal varices    Hx of transfusion of whole blood    Macular degeneration    LEFT EYE-RECEIVES INJECTION IN LEFT EYE FOR THIS EVERY 9 WEEKS    Tobacco Use: Social History   Tobacco Use  Smoking Status Former   Current packs/day: 1.50   Average  packs/day: 1.5 packs/day for 30.0 years (45.0 ttl pk-yrs)   Types: Cigarettes  Smokeless Tobacco Never  Tobacco Comments   Quit in 2021.    Labs: Review Flowsheet        No data to display           Pulmonary Assessment Scores:  Pulmonary Assessment Scores     Row Name 02/14/24 1352 02/14/24 1450       ADL UCSD   ADL Phase Entry Entry    SOB Score total -- 3    Rest -- 0    Walk -- 0    Stairs -- 3    Bath -- 0    Dress -- 0    Shop -- 0      CAT Score   CAT Score 21 --       UCSD: Self-administered rating of dyspnea associated with activities of daily living (ADLs) 6-point scale (0 = not at all to 5 = maximal or unable to do because of breathlessness)  Scoring Scores range from 0 to 120.   Minimally important difference is 5 units  CAT: CAT can identify the health impairment of COPD patients and is better correlated with disease progression.  CAT has a scoring range of zero to 40. The CAT score is classified into four groups of low (less than 10), medium (10 - 20), high (21-30) and very high (31-40) based on the impact level of disease on health status. A CAT score over 10 suggests significant symptoms.  A worsening CAT score could be explained by an exacerbation, poor medication adherence, poor inhaler technique, or progression of COPD or comorbid conditions.  CAT MCID is 2 points  mMRC: mMRC (Modified Medical Research Council) Dyspnea Scale is used to assess the degree of baseline functional disability in patients of respiratory disease due to dyspnea. No minimal important difference is established. A decrease in score of 1 point or greater is considered a positive change.   Pulmonary Function Assessment:  Pulmonary Function Assessment - 02/14/24 1341       Breath   Shortness of Breath Yes;Limiting activity          Exercise Target Goals: Exercise Program Goal: Individual exercise prescription set using results from initial 6 min walk test and THRR while considering  patient's activity barriers and safety.   Exercise Prescription Goal: Initial exercise prescription builds to 30-45 minutes a day of aerobic activity, 2-3 days per week.  Home exercise guidelines will be given to patient during program as part of exercise prescription that the participant will acknowledge.  Education: Aerobic Exercise: - Group verbal and visual presentation on the components of exercise prescription. Introduces F.I.T.T principle from ACSM for exercise prescriptions.  Reviews F.I.T.T. principles of aerobic exercise including progression. Written material provided at class time.   Education: Resistance Exercise: - Group verbal and visual presentation on the components of exercise  prescription. Introduces F.I.T.T principle from ACSM for exercise prescriptions  Reviews F.I.T.T. principles of resistance exercise including progression. Written material provided at class time. Flowsheet Row Pulmonary Rehab from 06/19/2024 in [facility] Cardiac and Pulmonary Rehab  Date 06/19/24  Educator nt  Instruction Review Code 1- TEFL teacher Understanding     Education: Exercise & Equipment Safety: - Individual verbal instruction and demonstration of equipment use and safety with use of the equipment. Flowsheet Row Pulmonary Rehab from 06/19/2024 in [facility] Cardiac and Pulmonary Rehab  Date 02/14/24  Educator [facility]  Instruction Review Code 1- Verbalizes Understanding  Education: Exercise Physiology & General Exercise Guidelines: - Group verbal and written instruction with models to review the exercise physiology of the cardiovascular system and associated critical values. Provides general exercise guidelines with specific guidelines to those with heart or lung disease.  Flowsheet Row Pulmonary Rehab from 06/19/2024 in [facility] Cardiac and Pulmonary Rehab  Education need identified 02/14/24    Education: Flexibility, Balance, Mind/Body Relaxation: - Group verbal and visual presentation with interactive activity on the components of exercise prescription. Introduces F.I.T.T principle from ACSM for exercise prescriptions. Reviews F.I.T.T. principles of flexibility and balance exercise training including progression. Also discusses the mind body connection.  Reviews various relaxation techniques to help reduce and manage stress (i.e. Deep breathing, progressive muscle relaxation, and visualization). Balance handout provided to take home. Written material provided at class time. Flowsheet Row Pulmonary Rehab from 06/19/2024 in [facility] Cardiac and Pulmonary Rehab  Date 06/19/24  Educator nt  Instruction Review Code 1- Verbalizes Understanding    Activity Barriers & Risk Stratification:  Activity Barriers &  Cardiac Risk Stratification - 02/16/24 1121       Activity Barriers & Cardiac Risk Stratification   Activity Barriers Joint Problems;Shortness of Breath          6 Minute Walk:  6 Minute Walk     Row Name 02/16/24 1116         6 Minute Walk   Phase Initial     Distance 1480 feet     Walk Time 6 minutes     # of Rest Breaks 0     MPH 2.8     METS 2.9     RPE 9     Perceived Dyspnea  0     VO2 Peak 10.8     Symptoms No     Resting HR 86 bpm     Resting BP 128/60     Resting Oxygen Saturation  95 %     Exercise Oxygen Saturation  during 6 min walk 92 %     Max Ex. HR 115 bpm     Max Ex. BP 144/68     2 Minute Post BP 124/62       Interval HR   1 Minute HR 100     2 Minute HR 115     3 Minute HR 115     4 Minute HR 71     5 Minute HR 115     6 Minute HR 111     2 Minute Post HR 89     Interval Heart Rate? Yes       Interval Oxygen   Interval Oxygen? Yes     Baseline Oxygen Saturation % 95 %     1 Minute Oxygen Saturation % 95 %     1 Minute Liters of Oxygen 0 L     2 Minute Oxygen Saturation % 98 %     2 Minute Liters of Oxygen 0 L     3 Minute Oxygen Saturation % 93 %     3 Minute Liters of Oxygen 0 L     4 Minute Oxygen Saturation % 92 %     4 Minute Liters of Oxygen 0 L     5 Minute Oxygen Saturation % 97 %     5 Minute Liters of Oxygen 0 L     6 Minute Oxygen Saturation % 97 %     6 Minute Liters of Oxygen 0 L  2 Minute Post Oxygen Saturation % 98 %     2 Minute Post Liters of Oxygen 0 L       Oxygen Initial Assessment:  Oxygen Initial Assessment - 02/14/24 1341       Home Oxygen   Home Oxygen Device None    Sleep Oxygen Prescription None    Home Exercise Oxygen Prescription None    Home Resting Oxygen Prescription None      Initial 6 min Walk   Oxygen Used None      Program Oxygen Prescription   Program Oxygen Prescription None      Intervention   Short Term Goals To learn and exhibit compliance with exercise, home and travel O2  prescription;To learn and understand importance of monitoring SPO2 with pulse oximeter and demonstrate accurate use of the pulse oximeter.;To learn and understand importance of maintaining oxygen saturations>88%;To learn and demonstrate proper pursed lip breathing techniques or other breathing techniques. ;To learn and demonstrate proper use of respiratory medications    Long  Term Goals Exhibits compliance with exercise, home  and travel O2 prescription;Verbalizes importance of monitoring SPO2 with pulse oximeter and return demonstration;Maintenance of O2 saturations>88%;Exhibits proper breathing techniques, such as pursed lip breathing or other method taught during program session;Compliance with respiratory medication;Demonstrates proper use of MDI's          Oxygen Re-Evaluation:  Oxygen Re-Evaluation     Row Name 02/16/24 1108 03/08/24 1116 04/24/24 1110 06/17/24 1123       Program Oxygen Prescription   Program Oxygen Prescription None None None None      Home Oxygen   Home Oxygen Device None None None None    Sleep Oxygen Prescription None None None None    Home Exercise Oxygen Prescription None None None None    Home Resting Oxygen Prescription None None None None      Goals/Expected Outcomes   Short Term Goals To learn and exhibit compliance with exercise, home and travel O2 prescription;To learn and understand importance of monitoring SPO2 with pulse oximeter and demonstrate accurate use of the pulse oximeter.;To learn and understand importance of maintaining oxygen saturations>88%;To learn and demonstrate proper pursed lip breathing techniques or other breathing techniques. ;To learn and demonstrate proper use of respiratory medications To learn and demonstrate proper pursed lip breathing techniques or other breathing techniques.  To learn and understand importance of maintaining oxygen saturations>88%;To learn and understand importance of monitoring SPO2 with pulse oximeter and  demonstrate accurate use of the pulse oximeter. To learn and understand importance of monitoring SPO2 with pulse oximeter and demonstrate accurate use of the pulse oximeter.;To learn and understand importance of maintaining oxygen saturations>88%    Long  Term Goals Exhibits compliance with exercise, home  and travel O2 prescription;Verbalizes importance of monitoring SPO2 with pulse oximeter and return demonstration;Maintenance of O2 saturations>88%;Exhibits proper breathing techniques, such as pursed lip breathing or other method taught during program session;Compliance with respiratory medication;Demonstrates proper use of MDI's Exhibits proper breathing techniques, such as pursed lip breathing or other method taught during program session Verbalizes importance of monitoring SPO2 with pulse oximeter and return demonstration;Maintenance of O2 saturations>88% Maintenance of O2 saturations>88%;Verbalizes importance of monitoring SPO2 with pulse oximeter and return demonstration    Comments Reviewed PLB technique with pt.  Talked about how it works and it's importance in maintaining their exercise saturations. Informed patient how to perform the Pursed Lipped breathing technique. Told patient to Inhale through the nose and out the mouth with pursed  lips to keep their airways open, help oxygenate them better, practice when at rest or doing strenuous activity. Patient Verbalizes understanding of technique and will work on and be reiterated during [facility]. She does not have a pulse oximeter to check her oxygen saturation at home. Informed her where to get one and explained why it is important to have one. Reviewed that oxygen saturations should be 88 percent and above. She does not have a pulse oximeter to check her oxygen saturation at home. Informed her where to get one and explained why it is important to have one. Reviewed that oxygen saturations should be 88 percent and above.    Goals/Expected Outcomes Short:  Become more profiecient at using PLB. Long: Become independent at using PLB. Short: use PLB with exertion. Long: use PLB on exertion proficiently and independently. Short: monitor oxygen at home with exertion. Long: maintain oxygen saturations above 88 percent independently. Short: monitor oxygen at home with exertion. Long: maintain oxygen saturations above 88 percent independently.       Oxygen Discharge (Final Oxygen Re-Evaluation):  Oxygen Re-Evaluation - 06/17/24 1123       Program Oxygen Prescription   Program Oxygen Prescription None      Home Oxygen   Home Oxygen Device None    Sleep Oxygen Prescription None    Home Exercise Oxygen Prescription None    Home Resting Oxygen Prescription None      Goals/Expected Outcomes   Short Term Goals To learn and understand importance of monitoring SPO2 with pulse oximeter and demonstrate accurate use of the pulse oximeter.;To learn and understand importance of maintaining oxygen saturations>88%    Long  Term Goals Maintenance of O2 saturations>88%;Verbalizes importance of monitoring SPO2 with pulse oximeter and return demonstration    Comments She does not have a pulse oximeter to check her oxygen saturation at home. Informed her where to get one and explained why it is important to have one. Reviewed that oxygen saturations should be 88 percent and above.    Goals/Expected Outcomes Short: monitor oxygen at home with exertion. Long: maintain oxygen saturations above 88 percent independently.          Initial Exercise Prescription:  Initial Exercise Prescription - 02/16/24 1100       Date of Initial Exercise RX and Referring Provider   Date 02/16/24    Referring Provider Dr. Dickey Orem      Oxygen   Maintain Oxygen Saturation 88% or higher      Treadmill   MPH 2.8    Grade 0    Minutes 15    METs 2.9      Recumbant Bike   Level 3    RPM 50    Watts 25    Minutes 15    METs 2.9      NuStep   Level 3    SPM 80    Minutes  15    METs 2.9      Rower   Level 1    Watts 25    Minutes 15    METs 2.9      Intensity   THRR 40-80% of Max Heartrate 109-133    Ratings of Perceived Exertion 11-13    Perceived Dyspnea 0-4      Resistance Training   Training Prescription Yes    Weight 3lb    Reps 10-15          Perform Capillary Blood Glucose checks as needed.  Exercise Prescription Changes:  Exercise Prescription Changes     Row Name 02/16/24 1100 02/28/24 1400 03/12/24 1300 05/09/24 1600 05/21/24 0800     Response to Exercise   Blood Pressure (Admit) 128/60 118/72 134/60 104/62 110/72   Blood Pressure (Exercise) 144/68 136/72 146/60 -- --   Blood Pressure (Exit) 124/62 120/68 118/62 110/58 108/58   Heart Rate (Admit) 86 bpm 79 bpm 85 bpm 78 bpm 89 bpm   Heart Rate (Exercise) 115 bpm 103 bpm 108 bpm 112 bpm 112 bpm   Heart Rate (Exit) 89 bpm 91 bpm 97 bpm 80 bpm 79 bpm   Oxygen Saturation (Admit) 95 % 94 % 96 % 95 % 96 %   Oxygen Saturation (Exercise) 92 % 93 % 90 % 93 % 95 %   Oxygen Saturation (Exit) 98 % 96 % 94 % 96 % 94 %   Rating of Perceived Exertion (Exercise) 9 11 12 13 11    Perceived Dyspnea (Exercise) 0 0 1 1 1    Symptoms R knee pain none none none none   Comments results first 2 weeks of exercise -- -- --   Duration -- Progress to 30 minutes of  aerobic without signs/symptoms of physical distress Continue with 30 min of aerobic exercise without signs/symptoms of physical distress. Continue with 30 min of aerobic exercise without signs/symptoms of physical distress. Continue with 30 min of aerobic exercise without signs/symptoms of physical distress.   Intensity -- THRR unchanged THRR unchanged THRR unchanged THRR unchanged     Progression   Progression -- Continue to progress workloads to maintain intensity without signs/symptoms of physical distress. Continue to progress workloads to maintain intensity without signs/symptoms of physical distress. Continue to progress workloads  to maintain intensity without signs/symptoms of physical distress. Continue to progress workloads to maintain intensity without signs/symptoms of physical distress.   Average METs -- 2.89 2.94 2.98 4.01     Resistance Training   Training Prescription -- Yes Yes Yes Yes   Weight -- 3 4 lb 4 lb 4 lb   Reps -- 10-15 10-15 10-15 10-15     Interval Training   Interval Training -- No No No No     Treadmill   MPH -- 2.3 2.5 2.5 2.5   Grade -- 0 0 0 0   Minutes -- 15 15 15 15    METs -- 2.76 2.91 2.91 2.91     Recumbant Bike   Level -- 2 3 -- --   Watts -- 25 30 -- --   Minutes -- 15 15 -- --   METs -- 3.04 3.03 -- --     NuStep   Level -- -- 1  T6 nustep: level 2 2 2    Minutes -- -- 15 15 15    METs -- -- --  T6 nustep: 3.3 mets 4 5.1     T5 Nustep   Level -- -- -- 1  T6 --   SPM -- -- -- 80 --   Minutes -- -- -- 15 --   METs -- -- -- 2.2 --     Biostep-RELP   Level -- 1 -- -- --   Minutes -- 15 -- -- --   METs -- 3 -- -- --     Oxygen   Maintain Oxygen Saturation -- 88% or higher 88% or higher 88% or higher 88% or higher    Row Name 07/02/24 1500             Response to Exercise  Blood Pressure (Admit) 118/62       Blood Pressure (Exit) 122/64       Heart Rate (Admit) 81 bpm       Heart Rate (Exercise) 107 bpm       Heart Rate (Exit) 98 bpm       Oxygen Saturation (Admit) 92 %       Oxygen Saturation (Exercise) 93 %       Oxygen Saturation (Exit) 93 %       Rating of Perceived Exertion (Exercise) 12       Perceived Dyspnea (Exercise) 1       Symptoms none       Duration Continue with 30 min of aerobic exercise without signs/symptoms of physical distress.       Intensity THRR unchanged         Progression   Progression Continue to progress workloads to maintain intensity without signs/symptoms of physical distress.       Average METs 2.9         Resistance Training   Training Prescription Yes       Weight 4 lb       Reps 10-15         Interval Training    Interval Training No         Treadmill   MPH 2.5       Grade 0       Minutes 15       METs 2.91         NuStep   Level 2       Minutes 15       METs 4         Biostep-RELP   Level 1       Minutes 15       METs 2         Oxygen   Maintain Oxygen Saturation 88% or higher          Exercise Comments:   Exercise Comments     Row Name 02/16/24 1107           Exercise Comments First full day of exercise!  Patient was oriented to gym and equipment including functions, settings, policies, and procedures.  Patient's individual exercise prescription and treatment plan were reviewed.  All starting workloads were established based on the results of the 6 minute walk test done at initial orientation visit.  The plan for exercise progression was also introduced and progression will be customized based on patient's performance and goals.          Exercise Goals and Review:   Exercise Goals     Row Name 02/16/24 1130             Exercise Goals   Increase Physical Activity Yes       Intervention Provide advice, education, support and counseling about physical activity/exercise needs.;Develop an individualized exercise prescription for aerobic and resistive training based on initial evaluation findings, risk stratification, comorbidities and participant's personal goals.       Expected Outcomes Short Term: Attend rehab on a regular basis to increase amount of physical activity.;Long Term: Add in home exercise to make exercise part of routine and to increase amount of physical activity.;Long Term: Exercising regularly at least 3-5 days a week.       Increase Strength and Stamina Yes       Intervention Provide advice, education, support and counseling about physical activity/exercise needs.;Develop an individualized exercise prescription  for aerobic and resistive training based on initial evaluation findings, risk stratification, comorbidities and participant's personal goals.        Expected Outcomes Short Term: Increase workloads from initial exercise prescription for resistance, speed, and METs.;Short Term: Perform resistance training exercises routinely during rehab and add in resistance training at home;Long Term: Improve cardiorespiratory fitness, muscular endurance and strength as measured by increased METs and functional capacity ( )       Able to understand and use rate of perceived exertion (RPE) scale Yes       Intervention Provide education and explanation on how to use RPE scale       Expected Outcomes Short Term: Able to use RPE daily in rehab to express subjective intensity level;Long Term:  Able to use RPE to guide intensity level when exercising independently       Able to understand and use Dyspnea scale Yes       Intervention Provide education and explanation on how to use Dyspnea scale       Expected Outcomes Short Term: Able to use Dyspnea scale daily in rehab to express subjective sense of shortness of breath during exertion;Long Term: Able to use Dyspnea scale to guide intensity level when exercising independently       Knowledge and understanding of Target Heart Rate Range (THRR) Yes       Intervention Provide education and explanation of THRR including how the numbers were predicted and where they are located for reference       Expected Outcomes Short Term: Able to state/look up THRR;Short Term: Able to use daily as guideline for intensity in rehab;Long Term: Able to use THRR to govern intensity when exercising independently       Able to check pulse independently Yes       Intervention Provide education and demonstration on how to check pulse in carotid and radial arteries.;Review the importance of being able to check your own pulse for safety during independent exercise       Expected Outcomes Short Term: Able to explain why pulse checking is important during independent exercise;Long Term: Able to check pulse independently and accurately        Understanding of Exercise Prescription Yes       Intervention Provide education, explanation, and written materials on patient's individual exercise prescription       Expected Outcomes Short Term: Able to explain program exercise prescription;Long Term: Able to explain home exercise prescription to exercise independently          Exercise Goals Re-Evaluation :  Exercise Goals Re-Evaluation     Row Name 02/16/24 1107 02/28/24 1449 03/12/24 1359 04/25/24 1404 05/09/24 1640     Exercise Goal Re-Evaluation   Exercise Goals Review Able to understand and use rate of perceived exertion (RPE) scale;Knowledge and understanding of Target Heart Rate Range (THRR);Understanding of Exercise Prescription;Increase Physical Activity;Increase Strength and Stamina;Able to understand and use Dyspnea scale;Able to check pulse independently Increase Strength and Stamina;Increase Physical Activity;Understanding of Exercise Prescription Increase Strength and Stamina;Increase Physical Activity;Understanding of Exercise Prescription Increase Strength and Stamina;Increase Physical Activity;Understanding of Exercise Prescription Increase Strength and Stamina;Increase Physical Activity;Understanding of Exercise Prescription   Comments Reviewed RPE and dyspnea scale, THR and program prescription with pt today.  Pt voiced understanding and was given a copy of goals to take home. Rhoda is off to a good start in the program. She was able to attend her first 2 sessions during this review period. During her sessions she was  able to use the treadmill at a speed of 2. and no incline, and the recumbent bike at level 2. We will continue to monitor her progress in the program. Rhoda is doing well in rehab. She increased her workload on the treadmill to a speed of 2.5 mph with no incline. She also improved to level 3 on the recumbent bike and increased to 4 lb handweights for resistance training. We will continue to monitor her progress  in the program. Rhoda has not attended rehab since the last review due to personal reasons. We will continue to monitor her progress when she returns to the program. Rhoda is doing well in rehab. She returned to rehab in this review. She maintained a speed of 2.5 mph on the treadmill with no incline. She increased to level 2 on the T4 nustep. She maintained level 1 on the T6 nustep. We will continue to monitor her progress in the program.   Expected Outcomes Short: Use RPE daily to regulate intensity. Long: Follow program prescription in THR. Short: Continue to follow exercise prescription. Long: Continue exercise to improve strength and stamina. Short: Continue to progressively increase treadmill workload. Long: Continue exercise to improve strength and stamina. Short: Return to rehab when appropriate. Long: Graduate. Short: Continue to progressively increase treadmill workload. Long: Continue exercise to improve strength and stamina.    Row Name 05/21/24 9164 06/04/24 1514 06/19/24 1545 07/02/24 1508       Exercise Goal Re-Evaluation   Exercise Goals Review Increase Strength and Stamina;Increase Physical Activity;Understanding of Exercise Prescription Increase Strength and Stamina;Increase Physical Activity;Understanding of Exercise Prescription Increase Strength and Stamina;Increase Physical Activity;Understanding of Exercise Prescription Increase Strength and Stamina;Increase Physical Activity;Understanding of Exercise Prescription    Comments Rhoda has only attended one session since the last review. During her one session she continued to work at level 2 on the T4 nustep. She also has maintained her treadmill workload at a speed of 2.5 mph with no incline. We will continue to monitor her progress in the program. Rhoda has not attended rehab since the last review. She last attended rehab on 05/06/2024. We will reach out to her to see when she plans to return. We will continue to monitor her progress  when she returns to the program. Rhoda did not attend rehab during the last review period. She did return to rehab on 06/17/2024 and did well with exercise. We will continue to monitor her progress in the program. Rhoda only attended rehab twice during the last review period. She continues to do well walking at 2.5 mph with no incline on the treadmill. She also continues to do well on the T4 nustep and biostep. We will continue to monitor her progress in the program.    Expected Outcomes Short: Attend rehab more consistently. Long: Continue exercise to improve strength and stamina. Short: Return to rehab when appropriate. Long: Graduate. Short: Continue to attend rehab consistently. Long: Continue exercise to improve strength and stamina. Short: Attend rehab more consistently. Long: Continue exercise to improve strength and stamina.       Discharge Exercise Prescription (Final Exercise Prescription Changes):  Exercise Prescription Changes - 07/02/24 1500       Response to Exercise   Blood Pressure (Admit) 118/62    Blood Pressure (Exit) 122/64    Heart Rate (Admit) 81 bpm    Heart Rate (Exercise) 107 bpm    Heart Rate (Exit) 98 bpm    Oxygen Saturation (Admit) 92 %    Oxygen  Saturation (Exercise) 93 %    Oxygen Saturation (Exit) 93 %    Rating of Perceived Exertion (Exercise) 12    Perceived Dyspnea (Exercise) 1    Symptoms none    Duration Continue with 30 min of aerobic exercise without signs/symptoms of physical distress.    Intensity THRR unchanged      Progression   Progression Continue to progress workloads to maintain intensity without signs/symptoms of physical distress.    Average METs 2.9      Resistance Training   Training Prescription Yes    Weight 4 lb    Reps 10-15      Interval Training   Interval Training No      Treadmill   MPH 2.5    Grade 0    Minutes 15    METs 2.91      NuStep   Level 2    Minutes 15    METs 4      Biostep-RELP   Level 1     Minutes 15    METs 2      Oxygen   Maintain Oxygen Saturation 88% or higher          Nutrition:  Target Goals: Understanding of nutrition guidelines, daily intake of sodium 1500mg , cholesterol 200mg , calories 30% from fat and 7% or less from saturated fats, daily to have 5 or more servings of fruits and vegetables.  Education: Nutrition 1 -Group instruction provided by verbal, written material, interactive activities, discussions, models, and posters to present general guidelines for heart healthy nutrition including macronutrients, label reading, and promoting whole foods over processed counterparts. Education serves as Pensions consultant of discussion of heart healthy eating for all. Written material provided at class time.     Education: Nutrition 2 -Group instruction provided by verbal, written material, interactive activities, discussions, models, and posters to present general guidelines for heart healthy nutrition including sodium, cholesterol, and saturated fat. Providing guidance of habit forming to improve blood pressure, cholesterol, and body weight. Written material provided at class time.     Biometrics:  Pre Biometrics - 02/16/24 1131       Pre Biometrics   Height 5' 1.5 (1.562 m)    Weight 166 lb 1.6 oz (75.3 kg)    Waist Circumference 39 inches    Hip Circumference 45.5 inches    Waist to Hip Ratio 0.86 %    BMI (Calculated) 30.88    Single Leg Stand 3.5 seconds           Nutrition Therapy Plan and Nutrition Goals:   Nutrition Assessments:  MEDIFICTS Score Key: >=70 Need to make dietary changes  40-70 Heart Healthy Diet <= 40 Therapeutic Level Cholesterol Diet  Flowsheet Row Pulmonary Rehab from 02/16/2024 in [facility]  Picture Your Plate Total Score on Admission 50   Picture Your Plate Scores: <59 Unhealthy dietary pattern with much room for improvement. 41-50 Dietary pattern unlikely to meet recommendations for good health and  room for improvement. 51-60 More healthful dietary pattern, with some room for improvement.  >60 Healthy dietary pattern, although there may be some specific behaviors that could be improved.   Nutrition Goals Re-Evaluation:  Nutrition Goals Re-Evaluation     Row Name 03/08/24 1119 04/26/24 1115 06/17/24 1122         Goals   Comment Patient was informed on why it is important to maintain a balanced diet when dealing with Respiratory issues. Explained that it takes a lot of  energy to breath and when they are short of breath often they will need to have a good diet to help keep up with the calories they are expending for breathing. Patient deferred RD appointment. Patient deferred RD appointment.     Expected Outcome Short: Choose and plan snacks accordingly to patients caloric intake to improve breathing. Long: Maintain a diet independently that meets their caloric intake to aid in daily shortness of breath. -- --        Nutrition Goals Discharge (Final Nutrition Goals Re-Evaluation):  Nutrition Goals Re-Evaluation - 06/17/24 1122       Goals   Comment Patient deferred RD appointment.          Psychosocial: Target Goals: Acknowledge presence or absence of significant depression and/or stress, maximize coping skills, provide positive support system. Participant is able to verbalize types and ability to use techniques and skills needed for reducing stress and depression.   Education: Stress, Anxiety, and Depression - Group verbal and visual presentation to define topics covered.  Reviews how body is impacted by stress, anxiety, and depression.  Also discusses healthy ways to reduce stress and to treat/manage anxiety and depression.  Written material provided at class time.   Education: Sleep Hygiene -Provides group verbal and written instruction about how sleep can affect your health.  Define sleep hygiene, discuss sleep cycles and impact of sleep habits. Review good sleep hygiene  tips.    Initial Review & Psychosocial Screening:  Initial Psych Review & Screening - 02/14/24 1348       Initial Review   Current issues with Current Sleep Concerns      Family Dynamics   Good Support System? Yes    Comments Rhoda can look to her sisters, boyfriend, niece and friends for support. She takes medication for sleep and does not take anything else for her mood.      Barriers   Psychosocial barriers to participate in program The patient should benefit from training in stress management and relaxation.      Screening Interventions   Interventions To provide support and resources with identified psychosocial needs;Encouraged to exercise;Provide feedback about the scores to participant    Expected Outcomes Short Term goal: Utilizing psychosocial counselor, staff and physician to assist with identification of specific Stressors or current issues interfering with healing process. Setting desired goal for each stressor or current issue identified.;Long Term Goal: Stressors or current issues are controlled or eliminated.;Short Term goal: Identification and review with participant of any Quality of Life or Depression concerns found by scoring the questionnaire.;Long Term goal: The participant improves quality of Life and PHQ9 Scores as seen by post scores and/or verbalization of changes          Quality of Life Scores:  Scores of 19 and below usually indicate a poorer quality of life in these areas.  A difference of  2-3 points is a clinically meaningful difference.  A difference of 2-3 points in the total score of the Quality of Life Index has been associated with significant improvement in overall quality of life, self-image, physical symptoms, and general health in studies assessing change in quality of life.  PHQ-9: Review Flowsheet       06/17/2024 04/26/2024 03/08/2024 02/14/2024  Depression screen PHQ 2/9  Decreased Interest 0 0 0 0 0  Down, Depressed, Hopeless 0 0 0 0 0   PHQ - 2 Score 0 0 0 0 0  Altered sleeping 3 2 3 3 3   Tired, decreased  energy 2 3 1 1 1   Change in appetite 3 3 1 1 1   Feeling bad or failure about yourself  0 0 0 0 0  Trouble concentrating 0 0 0 0 0  Moving slowly or fidgety/restless 0 0 0 0 0  Suicidal thoughts 0 0 0 0 0  PHQ-9 Score 8 8 5 5 5   Difficult doing work/chores Somewhat difficult Not difficult at all Not difficult at all Not difficult at all Not difficult at all    Details       Multiple values from one day are sorted in reverse-chronological order        Interpretation of Total Score  Total Score Depression Severity:  1-4 = Minimal depression, 5-9 = Mild depression, 10-14 = Moderate depression, 15-19 = Moderately severe depression, 20-27 = Severe depression   Psychosocial Evaluation and Intervention:  Psychosocial Evaluation - 02/14/24 1349       Psychosocial Evaluation & Interventions   Interventions Relaxation education;Encouraged to exercise with the program and follow exercise prescription;Stress management education    Comments Rhoda can look to her sisters, boyfriend, niece and friends for support. She takes medication for sleep and does not take anything else for her mood.    Expected Outcomes Short: Start [facility] to help with mood. Long: Maintain a healthy mental state    Continue Psychosocial Services  Follow up required by staff          Psychosocial Re-Evaluation:  Psychosocial Re-Evaluation     Row Name 03/08/24 1120 04/26/24 1121 06/17/24 1127         Psychosocial Re-Evaluation   Current issues with Current Sleep Concerns Current Sleep Concerns Current Sleep Concerns     Comments Reviewed patient health questionnaire (PHQ-9) with patient for follow up. Previously, patients score indicated signs/symptoms of depression.  Reviewed to see if patient is improving symptom wise while in program.  Score stayed the same and patient states that it is because she has trouble sleeping. . Reviewed  patient health questionnaire (PHQ-9) with patient for follow up. Previously, patients score indicated signs/symptoms of depression.  Reviewed to see if patient is improving symptom wise while in program.  Score decreased and patient states that it is because she has trouble sleeping still and is bored. Jermesha states she had alot of hobbies and wants to do those sorts of things again. Reviewed patient health questionnaire (PHQ-9) with patient for follow up. Previously, patients score indicated signs/symptoms of depression.  Reviewed to see if patient is improving symptom wise while in program.  Score stayed the same. She has been out prior due to COVID and vacation.     Expected Outcomes Short: Continue to attend [facility] regularly for regular exercise and social engagement. Long: Continue to improve symptoms and manage a positive mental state. Short: work towards a new hobby. Long: maintain a healthy mental state. Short: Continue to work toward an improvement in PHQ9 scores by attending [facility] regularly. Long: Continue to improve stress and depression coping skills by talking with staff and attending [facility] regularly and work toward a positive mental state.     Interventions Encouraged to attend Pulmonary Rehabilitation for the exercise Encouraged to attend Pulmonary Rehabilitation for the exercise Encouraged to attend Pulmonary Rehabilitation for the exercise     Continue Psychosocial Services  Follow up required by staff Follow up required by staff Follow up required by staff        Psychosocial Discharge (Final Psychosocial Re-Evaluation):  Psychosocial Re-Evaluation - 06/17/24  1127       Psychosocial Re-Evaluation   Current issues with Current Sleep Concerns    Comments Reviewed patient health questionnaire (PHQ-9) with patient for follow up. Previously, patients score indicated signs/symptoms of depression.  Reviewed to see if patient is improving symptom wise while in program.  Score  stayed the same. She has been out prior due to COVID and vacation.    Expected Outcomes Short: Continue to work toward an improvement in PHQ9 scores by attending [facility] regularly. Long: Continue to improve stress and depression coping skills by talking with staff and attending [facility] regularly and work toward a positive mental state.    Interventions Encouraged to attend Pulmonary Rehabilitation for the exercise    Continue Psychosocial Services  Follow up required by staff          Education: Education Goals: Education classes will be provided on a weekly basis, covering required topics. Participant will state understanding/return demonstration of topics presented.  Learning Barriers/Preferences:  Learning Barriers/Preferences - 02/14/24 1341       Learning Barriers/Preferences   Learning Barriers Sight    Learning Preferences None          General Pulmonary Education Topics:  Infection Prevention: - Provides verbal and written material to individual with discussion of infection control including proper hand washing and proper equipment cleaning during exercise session. Flowsheet Row Pulmonary Rehab from 06/19/2024 in [facility]  Date 02/14/24  Educator [facility]  Instruction Review Code 1- Verbalizes Understanding    Falls Prevention: - Provides verbal and written material to individual with discussion of falls prevention and safety. Flowsheet Row Pulmonary Rehab from 06/19/2024 in [facility]  Date 02/14/24  Educator [facility]  Instruction Review Code 1- Verbalizes Understanding    Chronic Lung Disease Review: - Group verbal instruction with posters, models, PowerPoint presentations and videos,  to review new updates, new respiratory medications, new advancements in procedures and treatments. Providing information on websites and 800 numbers for continued self-education. Includes information about supplement oxygen, available portable  oxygen systems, continuous and intermittent flow rates, oxygen safety, concentrators, and Medicare reimbursement for oxygen. Explanation of Pulmonary Drugs, including class, frequency, complications, importance of spacers, rinsing mouth after steroid MDI's, and proper cleaning methods for nebulizers. Review of basic lung anatomy and physiology related to function, structure, and complications of lung disease. Review of risk factors. Discussion about methods for diagnosing sleep apnea and types of masks and machines for OSA. Includes a review of the use of types of environmental controls: home humidity, furnaces, filters, dust mite/pet prevention, HEPA vacuums. Discussion about weather changes, air quality and the benefits of nasal washing. Instruction on Warning signs, infection symptoms, calling MD promptly, preventive modes, and value of vaccinations. Review of effective airway clearance, coughing and/or vibration techniques. Emphasizing that all should Create an Action Plan. Written material provided at class time. Flowsheet Row Pulmonary Rehab from 06/19/2024 in [facility]monary Rehab  Education need identified 02/14/24    AED/CPR: - Group verbal and written instruction with the use of models to demonstrate the basic use of the AED with the basic ABC's of resuscitation.    Tests and Procedures:  - Group verbal and visual presentation and models provide information about basic cardiac anatomy and function. Reviews the testing methods done to diagnose heart disease and the outcomes of the test results. Describes the treatment choices: Medical Management, Angioplasty, or Coronary Bypass Surgery for treating various heart conditions including Myocardial Infarction,  Angina, Valve Disease, and Cardiac Arrhythmias.  Written material provided at class time.   Medication Safety: - Group verbal and visual instruction to review commonly prescribed medications for heart and lung disease. Reviews the  medication, class of the drug, and side effects. Includes the steps to properly store meds and maintain the prescription regimen.  Written material given at graduation.   Other: -Provides group and verbal instruction on various topics (see comments)   Knowledge Questionnaire Score:  Knowledge Questionnaire Score - 02/14/24 1453       Knowledge Questionnaire Score   Pre Score 14/18           Core Components/Risk Factors/Patient Goals at Admission:  Personal Goals and Risk Factors at Admission - 02/14/24 1342       Core Components/Risk Factors/Patient Goals on Admission    Weight Management Yes;Weight Loss    Intervention Weight Management: Develop a combined nutrition and exercise program designed to reach desired caloric intake, while maintaining appropriate intake of nutrient and fiber, sodium and fats, and appropriate energy expenditure required for the weight goal.;Weight Management: Provide education and appropriate resources to help participant work on and attain dietary goals.;Weight Management/Obesity: Establish reasonable short term and long term weight goals.    Expected Outcomes Short Term: Continue to assess and modify interventions until short term weight is achieved;Long Term: Adherence to nutrition and physical activity/exercise program aimed toward attainment of established weight goal;Weight Loss: Understanding of general recommendations for a balanced deficit meal plan, which promotes 1-2 lb weight loss per week and includes a negative energy balance of [phone] kcal/d;Understanding recommendations for meals to include 15-35% energy as protein, 25-35% energy from fat, 35-60% energy from carbohydrates, less than 200mg  of dietary cholesterol, 20-35 gm of total fiber daily;Understanding of distribution of calorie intake throughout the day with the consumption of 4-5 meals/snacks    Improve shortness of breath with ADL's Yes    Intervention Provide education, individualized  exercise plan and daily activity instruction to help decrease symptoms of SOB with activities of daily living.    Expected Outcomes Short Term: Improve cardiorespiratory fitness to achieve a reduction of symptoms when performing ADLs;Long Term: Be able to perform more ADLs without symptoms or delay the onset of symptoms          Education:Diabetes - Individual verbal and written instruction to review signs/symptoms of diabetes, desired ranges of glucose level fasting, after meals and with exercise. Acknowledge that pre and post exercise glucose checks will be done for 3 sessions at entry of program.   Know Your Numbers and Heart Failure: - Group verbal and visual instruction to discuss disease risk factors for cardiac and pulmonary disease and treatment options.  Reviews associated critical values for Overweight/Obesity, Hypertension, Cholesterol, and Diabetes.  Discusses basics of heart failure: signs/symptoms and treatments.  Introduces Heart Failure Zone chart for action plan for heart failure. Written material provided at class time.   Core Components/Risk Factors/Patient Goals Review:   Goals and Risk Factor Review     Row Name 03/08/24 1118 04/26/24 1124 06/17/24 1121         Core Components/Risk Factors/Patient Goals Review   Personal Goals Review Improve shortness of breath with ADL's Weight Management/Obesity Improve shortness of breath with ADL's     Review Spoke to patient about their shortness of breath and what they can do to improve. Patient has been informed of breathing techniques when starting the program. Patient is informed to tell staff if they have had any  med changes and that certain meds they are taking or not taking can be causing shortness of breath. Rhoda wants to lose some weight. She feels like she is heavy and wants to reach a weight goal 135lbs. She planes to lose a few pounds in the next few weeks. patient is back from being on a medical hold. Spoke to patient  again about their shortness of breath and what they can do to improve. Patient has been informed of breathing techniques when starting the program. Patient is informed to tell staff if they have had any med changes and that certain meds they are taking or not taking can be causing shortness of breath.     Expected Outcomes Short: Attend [facility] regularly to improve shortness of breath with ADL's. Long: maintain independence with ADL's Short: lose a few pounds.Long: reach weight goal. Short: Attend [facility] regularly to improve shortness of breath with ADL's. Long: maintain independence with ADL's        Core Components/Risk Factors/Patient Goals at Discharge (Final Review):   Goals and Risk Factor Review - 06/17/24 1121       Core Components/Risk Factors/Patient Goals Review   Personal Goals Review Improve shortness of breath with ADL's    Review patient is back from being on a medical hold. Spoke to patient again about their shortness of breath and what they can do to improve. Patient has been informed of breathing techniques when starting the program. Patient is informed to tell staff if they have had any med changes and that certain meds they are taking or not taking can be causing shortness of breath.    Expected Outcomes Short: Attend [facility] regularly to improve shortness of breath with ADL's. Long: maintain independence with ADL's          ITP Comments:  ITP Comments     Row Name 02/16/24 1107 03/13/24 1100 04/10/24 1008 05/08/24 1036 05/08/24 1042   ITP Comments First full day of exercise!  Patient was oriented to gym and equipment including functions, settings, policies, and procedures.  Patient's individual exercise prescription and treatment plan were reviewed.  All starting workloads were established based on the results of the 6 minute walk test done at initial orientation visit.  The plan for exercise progression was also introduced and progression will be customized based on  patient's performance and goals. 30 Day review completed. Medical Director ITP review done, changes made as directed, and signed approval by Medical Director. 30 Day review completed. Medical Director ITP review done, changes made as directed, and signed approval by Medical Director. 30 Day review completed. Medical Director ITP review done, changes made as directed, and signed approval by Medical Director. Virtual Visit completed. Patient informed on EP and RD appointment and 6 Minute walk test. Patient also informed of patient health questionnaires on My Chart. Patient Verbalizes understanding. For visist on 02/14/2024.    Row Name 06/05/24 1116 07/03/24 1054         ITP Comments 30 Day review completed. Medical Director ITP review done; changes made as directed and signed approval by Medical Director. New to program. 30 Day review completed. Medical Director ITP review done; changes made as directed and signed approval by Medical Director.         Comments: 30 day review

## 2024-07-04 NOTE — Progress Notes (Signed)
 [facility] FOLLOW UP NOTE    History of Present Illness: Michelle Acevedo is a 73 y.o. female with COPD, emphysema, HCV cirrhosis (s/p Harvoni with SVR 2015) c/b portal HTN with EV, HE, and refractory ascites who presents to the [facility] for management of immunosuppression following liver transplantation. She underwent OLT in 2021. She has done well.  She has a history of chronic diarrhea that pre-dates her transplant. Stool testing was completed. Negative stool culture, O&P, as well as CMV and C. Diff.   Interval History Michelle Acevedo was last seen in liver clinic 12/2023. Since that time we had her outside CT chest performedin 07/2022 brought over to [facility] for interpretation and was found to have a lung mass c/f malignancy. She subsequently saw CT surgery and had wedge resection 03/2024. 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 She then had fat pad biopsy that was negative, no c/f systemic amyloidosis. She was recommended to have repeat CT I 6 months. PET scan did not show Gi activity. States having worsening night sweats.  She continues to have diarrhea. She is scheduled for EGD and colonoscopy next month.   She has current URI symptoms that began on Sunday. She has been taking amoxicillin 500mg  BID that she got from [place] about a year ago. Feeling better. Denies any fevers or chills. States home Covid test was negative.    She is currently on tac 1/1. Last took at 7pm.   Health Maintenance CRC screening: 05/2020 no polyps, next due 2026 given hx prior TA. Scheduled for repeat cscope 11/18 given ongoing diarrhea, and lymphoma diagnosis HAV: immune (2021)  HBV: core positive, sAb positive (2021) Influenza: received 2024. Did not give today as has URI. Should get high dose flu shot when feeling better locally.  RSV - received COVID: declines Pneumonia: Prevnar 05/2017, Pneumovax: 09/2010, 07/2021   Derm:  following, had SCC excised.Sees every 6 months, no new skin Ca Pap: s/p hysterectomy  Mammo: up to date, has +family hx breast Ca (mother, aunt, 2 sisters) has done. Unsure on genetic testing, thinks one family member may have been BRCA +. Had 12/2023, normal. Repeat 1 year DEXA:  done 12/2021,  not taking alendronate as prescribed, Also not on risendronate. Will follow up with PCP.  Lung CA screening: as above  Varicella IgG positive - 12/31/2019 Varicella zoster (Shingrix) 07/02/2019   . Varicella zoster (Zostavax) 09/13/2011  Shingles: received  PMH: Past Medical History:  Diagnosis Date  . AKI (acute kidney injury) 02/13/2020  . Allergy Penicillin  . Anemia 2020   Liver disease  . Chronic hepatitis C with cirrhosis (CMS/HHS-HCC) 05/26/2015  . Cirrhosis (CMS/HHS-HCC)   . 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 Emphysema of lung (CMS/HHS-HCC) Mild case  . Gall bladder disease   . Heart murmur May 2021   Mild  . Hepatitis C    genotype 1a treated with harvoni 2015   . History of cataract removed 2012  . Hx of transfusion of whole blood 1997  . Obesity   . 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 Primary osteoarthritis of both knees 06/18/2020  . Pulmonary emphysema (CMS/HHS-HCC) 09/25/2019    PSH: Past Surgical History:  Procedure Laterality Date  . TONSILLECTOMY  1968  . HYSTERECTOMY  1980  . CHOLECYSTECTOMY  1983  . APPENDECTOMY  1983   Removed with gall bladder  . EGD  08/10/2009  . COLONOSCOPY  03/09/2010  [facility], repeat 03/10/2015  . EGD  03/09/2010  . CATARACT EXTRACTION  2012  . EGD  02/29/2012   [facility]  . EGD N/A 05/30/2016   Procedure: EGD;  Surgeon: Manuelita Sammuel Novak, MD;  [facility]: [facility];  Service: Gastroenterology;  Laterality: N/A;  . Open reduction and internal fixation of a left distal radius fracture Left 12/08/2016   Dr. Kathlynn  . FRACTURE SURGERY  2019  . ESOPHAGOGASTRODOUDENOSCOPY  W/BIOPSY N/A 02/07/2020   Procedure: Upper Endoscopy;  Surgeon: Veerappan, Annapoorani, MD;  [facility]: [facility];  Service: Gastroenterology;  Laterality: N/A;  . COLONOSCOPY W/BIOPSY N/A 02/07/2020   Procedure: Colonoscopy;  Surgeon: Veerappan, Annapoorani, MD;  Location: [facility];  Service: Gastroenterology;  Laterality: N/A;  . TRANSPLANT LIVER N/A 02/11/2020   Procedure: ADULT, LIVER ALLOTRANSPLANTATION; ORTHOTOPIC, PARTIAL OR WHOLE, FROM CADAVER OR LIVING DONOR;  Surgeon: Gerome Adine Shove, MD;  [facility]: [facility];  Service: General Surgery;  Laterality: N/A;  . THORACOSCOPY WITH THERAPEUTIC WEDGE RESECTION INITIAL UNILATERAL Right 03/19/2024   Procedure: THORACOSCOPY, SURGICAL; WITH THERAPEUTIC WEDGE RESECTION (EG, MASS, NODULE), INITIAL UNILATERAL, Right VATS middle lobe wedge resection;  Surgeon: Valli Dickey Fitch, MD;  Location: [facility];  Service: Cardiothoracic;  Laterality: Right;  . THORACOSCOPY WITH LOBECTOMY LUNG Right 03/19/2024   Procedure: THORACOSCOPY, SURGICAL; WITH LOBECTOMY (SINGLE LOBE), possible completion lobectomy, Right;  Surgeon: Valli Dickey Fitch, MD;  Location: [facility];  Service: Cardiothoracic;  Laterality: Right;  . THORACOSCOPY WITH MEDIASTINAL AND REGIONAL LYMPHADENECTOMY N/A 03/19/2024   Procedure: THORACOSCOPY, SURGICAL; WITH MEDIASTINAL AND REGIONAL LYMPHADENECTOMY (LIST IN ADDITION TO PRIMARY PROCEDURE);  Surgeon: Valli Dickey Fitch, MD;  Location: [facility];  Service: Cardiothoracic;  Laterality: N/A;  . COLONOSCOPY    . KNEE ARTHROSCOPY  1998?  [facility]AB[facility] UPPER GASTROINTESTINAL ENDOSCOPY      Medications: Current Outpatient Medications  Medication Sig Dispense Refill  . albuterol  MDI, PROVENTIL , VENTOLIN , PROAIR , HFA 90 mcg/actuation inhaler Inhale 2 inhalations into the lungs 4 (four) times daily as needed 8.5 g 0  . amoxicillin (AMOXIL) 500 MG capsule Take 500 mg by mouth 2 (two) times daily     . clobetasoL (TEMOVATE) 0.05 % cream Apply topically 2 (two) times daily 30 g 0  . pantoprazole (PROTONIX) 40 MG DR tablet Take 1 tablet (40 mg total) by mouth once daily 30 tablet 11  . tacrolimus (PROGRAF) 0.5 MG capsule Take 2 capsules by mouth every morning and 2 capsules every evening 120 capsule 11  . traZODone (DESYREL) 50 MG tablet Take 1 tablet (50 mg total) by mouth at bedtime 90 tablet 3   No current facility-administered medications for this visit.     Allergies: Allergies  Allergen Reactions  . Penicillin V Potassium Rash    Not allergic to Amoxicillin   . Penicillins Rash and Itching    Has patient had a PCN reaction causing immediate rash, facial/tongue/throat swelling, SOB or lightheadedness with hypotension:unsure  Has patient had a PCN reaction causing severe rash involving mucus membranes or skin necrosis:No  Has patient had a PCN reaction that required hospitalization:No  Has patient had a PCN reaction occurring within the last 10 years:No   If all of the above answers are NO, then may proceed with Cephalosporin use.  Has patient had a PCN reaction causing immediate rash, facial/tongue/throat swelling, SOB or lightheadedness with hypotension:unsure Has patient had a PCN reaction causing severe rash involving mucus membranes or skin necrosis:No Has patient had a PCN  reaction that required hospitalization:No Has patient had a PCN reaction occurring within the last 10 years:No  If all of the above answers are NO, then may proceed with Cephalosporin use.    Did it involve swelling of the face/tongue/throat, SOB, or low BP? No Did it involve sudden or severe rash/hives, skin peeling, or any reaction on the inside of your mouth or nose? No Did you need to seek medical attention at a hospital or doctor's office? No When did it last happen?      childhood allergy  If all above answers are "NO", may proceed with cephalosporin use.    FAM: Family History  Problem  Relation Name Age of Onset  . Lung cancer Mother Dorma Altman   . Breast cancer Mother Zerenity Bowron   . Hip fracture Father CHARLENA JUDITHANN [place]   . Heart disease Father CHARLENA JUDITHANN [place]   . Breast cancer Sister Jori Marc   . Breast cancer Sister Kyra DASEN. Doss   . Breast cancer Sister Kyra Ford   . Heart disease Maternal Grandmother Cristopher [place]   . Lung cancer Sister Kyra DASEN. Doss   . Cancer Maternal Wylie Gibson   . Cancer Maternal Aunt Dickey Jacob   . Cancer Maternal Wylie Mom     [facility]: she  reports that she quit smoking about 5 years ago. Her smoking use included cigarettes. She started smoking about 27 years ago. She has a 22 pack-year smoking history. She has been exposed to tobacco smoke. She has never used smokeless tobacco. She reports that she does not currently use alcohol. She reports that she does not use drugs.  ROS:   A ROS was performed including pertinent positive and negatives as documented. All other symptoms are negative.    Physical Examination: Vitals:   07/04/24 1003  BP: 111/76  Pulse: 76  Temp: 36.8 C (98.2 F)      Body mass index is 32.29 kg/m.  Physical Exam General: alert, well-appearing, NAD HEENT: Non-icteric sclera. Normal hearing Cardiovascular: regular rate and rhythm, no murmurs Pulmonary: inspiratory and expiratory wheezing throughout with crackles, non-labored breathing Abdomen: no bowel sounds appreciated on exam, mild ttp RUQ, stable Extremities: warm, perfused, no lower extremity edema Skin: no rashes Neuro: A&Ox3, grossly intact Psych: appropriate mood and affect  Labs:  Office Visit on 07/04/2024  Component Date Value Ref Range Status  . WBC (White Blood Cell Count) 07/04/2024 4.6  3.2 - 9.8 x10^9/L Final  . Hemoglobin 07/04/2024 14.1  11.7 - 15.5 g/dL Final  . Hematocrit 89/76/7974 41.9  35.0 - 45.0 % Final  . Platelets 07/04/2024 106 (L)  150 - 450 x10^9/L Final  . MCV (Mean Corpuscular Volume) 07/04/2024 88  80 - 98 fL Final   . MCH (Mean Corpuscular Hemoglobin) 07/04/2024 29.7  26.5 - 34.0 pg Final  . MCHC (Mean Corpuscular Hemoglobin * 07/04/2024 33.7  31.0 - 36.0 % Final  . RBC (Red Blood Cell Count) 07/04/2024 4.74  3.77 - 5.16 x10^12/L Final  . RDW-CV (Red Cell Distribution Widt* 07/04/2024 13.6  11.5 - 14.5 % Final  . NRBC (Nucleated Red Blood Cell Cou* 07/04/2024 0.00  0 x10^9/L Final  . NRBC % (Nucleated Red Blood Cell %) 07/04/2024 0.0  % Final  . MPV (Mean Platelet Volume) 07/04/2024 10.7  7.2 - 11.7 fL Final  . Immature Platelet Fraction 07/04/2024 5.1  1.6 - 8.5 % Final  . Sodium 07/04/2024 137  135 - 145 mmol/L Final  . Potassium  07/04/2024 4.4  3.5 - 5.0 mmol/L Final  . Chloride 07/04/2024 101  98 - 108 mmol/L Final  . Carbon Dioxide (CO2) 07/04/2024 27  21 - 30 mmol/L Final  . Urea Nitrogen (BUN) 07/04/2024 11  7 - 20 mg/dL Final  . Creatinine 89/76/7974 0.9  0.4 - 1.0 mg/dL Final  . Glucose 89/76/7974 168 (H)  70 - 140 mg/dL Final   Interpretive Data: Above is the NONFASTING reference range.   Below are the FASTING reference ranges:  NORMAL:      70-99 mg/dL  PREDIABETES: 899-874 mg/dL  DIABETES:    > 874 mg/dL   . Calcium 07/04/2024 9.4  8.7 - 10.2 mg/dL Final  . AST (Aspartate Aminotransferase) 07/04/2024 22  15 - 41 U/L Final  . ALT (Alanine Aminotransferase) 07/04/2024 17  10 - 39 U/L Final  . Bilirubin, Total 07/04/2024 0.9  0.4 - 1.5 mg/dL Final  . Alk Phos (Alkaline Phosphatase) 07/04/2024 83  24 - 110 U/L Final  . Albumin 07/04/2024 3.6  3.5 - 4.8 g/dL Final  . Protein, Total 07/04/2024 7.2  6.2 - 8.1 g/dL Final  . Anion Gap 89/76/7974 9  3 - 12 mmol/L Final  . BUN/CREA Ratio 07/04/2024 12  6 - 27 Final  . Glomerular Filtration Rate (eGFR)  07/04/2024 67  mL/min/1.73sq m Final   CKD-EPI (2021) does not include patient's race in the calculation of eGFR. Monitoring changes of plasma creatinine and eGFR over time is useful for monitoring kidney function.  This change was made on  11/10/2020.  Interpretive Ranges for eGFR(CKD-EPI 2021):   eGFR:              > 60 mL/min/1.73 sq m - Normal  eGFR:              30 - 59 mL/min/1.73 sq m - Moderately Decreased  eGFR:              15 - 29 mL/min/1.73 sq m - Severely Decreased  eGFR:              < 15 mL/min/1.73 sq m -  Kidney Failure   Note: These eGFR calculations do not apply in acute situations  when eGFR is changing rapidly or in patients on dialysis.   . FK506 (Tacrolimus) 07/04/2024 3.8 (L)  5.0 - 20.0 ng/mL Final  . Magnesium 07/04/2024 1.5 (L)  1.8 - 2.5 mg/dL Final  . 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  She underwent orthotopic liver transplant on 02/11/2020. Liver enzymes had risen post LT. MRI showed stable bile duct dilation and liver enzymes improved w/o intervention. Enzymes have been monitored and have been stable since mid-2022. LFTs today normal.   Recently diagnosed with lung mass. S/p wedge resection in July with path c/w marginal zone lymphoma. Sees Dr. Blinda. Fat pad bx negative for systemic amyloid. Per their note needs repeat CT in 6 months and possible PET 6 months after that. Not ordered or scheduled, have sent staff message to ensure follow up.   Today has URI sx. No fevers. Non-toxic appearing. CXR negative. Has been taking amoxicillin she got from [place]. Completed 5 day course. Can stop this, likely viral infection. Education provided to avoid this and take abx only prescribed by providers.   Liver  Transplant Management. Her liver function tests are normal. We stopped her MMF in October 2024. She remains on tac 1/1. This was lowered in setting of lymphoma diagnosis. Last took at~7pm. Goal 4-6.   2. Donor hepatitis B core AB+: tenofovir stopped 06/2022. Hep sAb reactive. Negative surface Ag and  HBV Dna undetected.  - Repeat HBV DNA today, will check q6 months. Surface Ag neg - Needs treatment if gets any chemotherapy  3. Osteoporosis: DEXA up to date 12/2021 and redemonstrated osteoporosis, PCP restarted on bisphosphonate (alendronate) but patient has not been taking this d/t side effects. She was then prescribed risendronate, also not taking. Advised to follow up with local endocrinology. Offered referral to [facility] in past but she wishes to f/u locally.   4. Health Maintenance. - did not give flu shot as has URI. Will get at home once feeling better. Otherwise UTD.  5. Diarrhea: Has about 6-7 BMs daily. Infectious w/u summer 2023 negative.We stopped her MMF in October 2024 but diarrhea persists despite this. She also experiences abdominal bloat/discomfort and early satiety. Given recent lymphoma diagnosis c/f for GI involvement though PET scan did nto show any GI activity. She is scheduled for EGD and colonoscopy next month. Can also r/o microscopic colitis if present.    Follow-up Return in 6 months for follow-up.  Samantha Barrett PA-C  Attestation Statement:   I personally saw the patient and performed a substantive portion of the medical decision making, in conjunction with the Advanced Practice Provider. Overall stable, other than URI. CXR neg for PNA. Needs onc f/u. IS appropriate.    LINDSAY SAMMUEL NOVAK, MD

## 2024-07-05 ENCOUNTER — Encounter

## 2024-07-08 ENCOUNTER — Emergency Department

## 2024-07-08 ENCOUNTER — Encounter

## 2024-07-08 ENCOUNTER — Other Ambulatory Visit: Payer: Self-pay

## 2024-07-08 ENCOUNTER — Emergency Department
Admission: EM | Admit: 2024-07-08 | Discharge: 2024-07-08 | Disposition: A | Discharge status: Home / Self Care | Attending: Emergency Medicine | Admitting: Emergency Medicine

## 2024-07-08 DIAGNOSIS — R059 Cough, unspecified: Secondary | ICD-10-CM | POA: Diagnosis not present

## 2024-07-08 DIAGNOSIS — J3489 Other specified disorders of nose and nasal sinuses: Secondary | ICD-10-CM | POA: Diagnosis not present

## 2024-07-08 DIAGNOSIS — G4489 Other headache syndrome: Secondary | ICD-10-CM | POA: Insufficient documentation

## 2024-07-08 DIAGNOSIS — R519 Headache, unspecified: Secondary | ICD-10-CM | POA: Diagnosis present

## 2024-07-08 LAB — COMPREHENSIVE METABOLIC PANEL WITH GFR
ALT: 27 U/L (ref 0–44)
AST: 38 U/L (ref 15–41)
Albumin: 4.1 g/dL (ref 3.5–5.0)
Alkaline Phosphatase: 96 U/L (ref 38–126)
Anion gap: 9 (ref 5–15)
BUN: 13 mg/dL (ref 8–23)
CO2: 26 mmol/L (ref 22–32)
Calcium: 9.6 mg/dL (ref 8.9–10.3)
Chloride: 98 mmol/L (ref 98–111)
Creatinine, Ser: 0.66 mg/dL (ref 0.44–1.00)
GFR, Estimated: >60 mL/min (ref >60)
Glucose, Bld: 145 mg/dL — ABNORMAL HIGH (ref 70–99)
Potassium: 4.2 mmol/L (ref 3.5–5.1)
Sodium: 133 mmol/L — ABNORMAL LOW (ref 135–145)
Total Bilirubin: 1.1 mg/dL (ref 0.0–1.2)
Total Protein: 8.2 g/dL — ABNORMAL HIGH (ref 6.5–8.1)

## 2024-07-08 LAB — RESP PANEL BY RT-PCR (RSV, FLU A&B, COVID)  RVPGX2
Influenza A by PCR: NEGATIVE
Influenza B by PCR: NEGATIVE
Resp Syncytial Virus by PCR: NEGATIVE
SARS Coronavirus 2 by RT PCR: NEGATIVE

## 2024-07-08 LAB — CBC
HCT: 42 % (ref 36.0–46.0)
Hemoglobin: 14.1 g/dL (ref 12.0–15.0)
MCH: 29.9 pg (ref 26.0–34.0)
MCHC: 33.6 g/dL (ref 30.0–36.0)
MCV: 89 fL (ref 80.0–100.0)
Platelets: 107 K/uL — ABNORMAL LOW (ref 150–400)
RBC: 4.72 MIL/uL (ref 3.87–5.11)
RDW: 13.6 % (ref 11.5–15.5)
WBC: 4.7 K/uL (ref 4.0–10.5)
nRBC: 0 % (ref 0.0–0.2)

## 2024-07-08 MED ORDER — ACETAMINOPHEN 325 MG PO TABS
650.0000 mg | ORAL_TABLET | Freq: Once | ORAL | Status: AC
Start: 2024-07-08 — End: 2024-07-08
  Administered 2024-07-08: 650 mg via ORAL
  Filled 2024-07-08: qty 2

## 2024-07-08 MED ORDER — SODIUM CHLORIDE 0.9 % IV BOLUS
500.0000 mL | Freq: Once | INTRAVENOUS | Status: AC
  Administered 2024-07-08: 500 mL via INTRAVENOUS

## 2024-07-08 MED ORDER — KETOROLAC TROMETHAMINE 15 MG/ML IJ SOLN
15.0000 mg | Freq: Once | INTRAMUSCULAR | Status: AC
  Administered 2024-07-08: 15 mg via INTRAVENOUS
  Filled 2024-07-08: qty 1

## 2024-07-08 MED ORDER — FLUTICASONE PROPIONATE 50 MCG/ACT NA SUSP: 1.0000 | Freq: Every day | NASAL | 0 refills | Status: AC

## 2024-07-08 MED ORDER — DIPHENHYDRAMINE HCL 50 MG/ML IJ SOLN
25.0000 mg | Freq: Once | INTRAMUSCULAR | Status: AC
Start: 2024-07-08 — End: 2024-07-08
  Administered 2024-07-08: 25 mg via INTRAVENOUS
  Filled 2024-07-08: qty 1

## 2024-07-08 MED ORDER — PROCHLORPERAZINE EDISYLATE 10 MG/2ML IJ SOLN
10.0000 mg | Freq: Once | INTRAMUSCULAR | Status: AC
  Administered 2024-07-08: 10 mg via INTRAVENOUS
  Filled 2024-07-08: qty 2

## 2024-07-08 NOTE — Progress Notes (Addendum)
 Pt presents with headache, Nasal congestion, eye pain, ear pain, nausea, vomiting, and cough 1 week. Pt took covid test yesterday and refused ours.

## 2024-07-08 NOTE — ED Provider Notes (Signed)
 APP supervisory note  73 year old female presenting with headache x 1 week with associated cough and congestion.  Headache is primarily on her right side.  Reports distant history of migraines, no recent headaches.  No numbness, tingling, focal weakness.  No trauma.  No vision changes.  On exam, overall well-appearing.  No midline neck pain.  No meningismus.  5-5 strength of bilateral upper and lower extremities with normal sensation.  Normal finger-to-nose testing.  Will obtain labs, x-Dacie Mandel, CT head to further evaluate.  CT head resulted without acute abnormality.  Some sinus inflammation noted which may be contributing to patient's symptoms.  X-Roniel Halloran without focal consolidation.  Patient improved on reevaluation.  Overall low suspicion emergent process.  Do think patient is stable for discharge home.  Strict return precautions provided.     Levander Slate, MD 07/08/24 1434

## 2024-07-08 NOTE — ED Triage Notes (Addendum)
 Pt comes with cough, cold symptoms and headache for week. Pt states neg covid test yesterday at home. Pt had xray at [facility] on Thursday and it was negative.

## 2024-07-08 NOTE — ED Provider Notes (Signed)
 [facility] Provider Note    Event Date/Time   First MD Initiated Contact with Patient 07/08/24 1124     (approximate)   History   Headache and Cough   HPI  Michelle Acevedo is a 73 y.o. female  with a past medical history of chronic Hepatitis C with cirrhosis, pulmonary nodule with wedge resection biopsy, emphysema presents to the emergency department with right-sided headache waking her from sleep, cough, rhinorrhea x 1 week. Denies any numbness, weakness, head injury, fall, vision changes, midline neck pain, chest pain, SOB. Some relief with nasal spray and oxycodone  she had at home for headache. Sent from [facility] urgent care for further workup due to headache wakening from sleep.  Physical Exam   Triage Vital Signs: ED Triage Vitals  Encounter Vitals Group     BP 07/08/24 1054 (!) 126/114     Girls Systolic BP Percentile --      Girls Diastolic BP Percentile --      Boys Systolic BP Percentile --      Boys Diastolic BP Percentile --      Pulse Rate 07/08/24 1054 70     Resp 07/08/24 1054 18     Temp 07/08/24 1054 98.2 F (36.8 C)     Temp src --      SpO2 07/08/24 1054 100 %     Weight 07/08/24 1054 163 lb (73.9 kg)     Height 07/08/24 1054 5' (1.524 m)     Head Circumference --      Peak Flow --      Pain Score --      Pain Loc --      Pain Education --      Exclude from Growth Chart --     Most recent vital signs: Vitals:   07/08/24 1125 07/08/24 1433  BP: (!) 140/75 100/88  Pulse: 69 77  Resp: 18 18  Temp:    SpO2: 97% 98%    General: Awake, in no acute distress. Appears stated age. Head: Normocephalic, atraumatic. Eyes: PERRLA. EOMs intact. No scleral icterus or conjunctival injection. Ears/Nose/Throat: TMs intact b/l. Nares patent, no nasal discharge. Oropharynx moist, no erythema or exudate. Dentition intact. TTP along b/l maxillary and ethmoid sinuses. Neck: Supple, no lymphadenopathy, no nuchal rigidity No midline cervical  tenderness.  CV: Good peripheral perfusion. Regular rate, 69 bpm. Respiratory:Normal respiratory effort.  No respiratory distress. B/l wheezing on exam throughout lungs. GI: Soft, non-distended, non-tender.  MSK: Normal ROM and 5/5 strength in b/l upper and lower extremities.  Skin:Warm, dry, intact. No rashes, lesions, or ecchymosis. No cyanosis or pallor. Neurological: A&Ox4 to person, place, time, and situation. Cranial nerves II-XII intact. Sensation intact. Strength symmetric. No focal deficits. Negative Kernig and Brudzinski signs.  ED Results / Procedures / Treatments   Labs (all labs ordered are listed, but only abnormal results are displayed) Labs Reviewed  CBC - Abnormal; Notable for the following components:      Result Value   Platelets 107 (*)    All other components within normal limits  COMPREHENSIVE METABOLIC PANEL WITH GFR - Abnormal; Notable for the following components:   Sodium 133 (*)    Glucose, Bld 145 (*)    Total Protein 8.2 (*)    All other components within normal limits  RESP PANEL BY RT-PCR (RSV, FLU A&B, COVID)  [id number]     EKG     RADIOLOGY CXR FINDINGS:  LUNGS AND PLEURA: Chronically somewhat  large lung volumes, borderline to mild hyperinflation. Chronic streaky bilateral lower lung opacity, not significantly changed from 06/18/2015 radiographs. No acute lung opacity. No pulmonary edema. No pleural effusion. No pneumothorax.   HEART AND MEDIASTINUM: No acute abnormality of the cardiac and mediastinal silhouettes.   BONES AND SOFT TISSUES: Chronic abdominal surgical clips. No acute osseous abnormality.   IMPRESSION: 1. No acute cardiopulmonary abnormality.   CT head w/o contrast FINDINGS: BRAIN AND VENTRICLES: No acute hemorrhage. No evidence of acute infarct. No hydrocephalus. No extra-axial collection. No mass effect or midline shift. Normal brain volume for age. Normal gray white differentiation for age. Calcified  atherosclerosis. No suspicious intracranial vascular hyperdensity.   ORBITS: No acute abnormality.   SINUSES: Mild to moderate visible paranasal sinus mucosal thickening, some bubbly opacity.   SOFT TISSUES AND SKULL: No acute soft tissue abnormality. No skull fracture. Middle ears and mastoids appear clear.   IMPRESSION: 1. Normal for age non-contrast CT appearance of the brain. 2. Mild to moderate paranasal sinus inflammation.   PROCEDURES:  Critical Care performed: No   Procedures   MEDICATIONS ORDERED IN ED: Medications  diphenhydrAMINE (BENADRYL) injection 25 mg (25 mg Intravenous Given 07/08/24 1248)  acetaminophen  (TYLENOL ) tablet 650 mg (650 mg Oral Given 07/08/24 1241)  sodium chloride  0.9 % bolus 500 mL (0 mLs Intravenous Stopped 07/08/24 1440)  prochlorperazine (COMPAZINE) injection 10 mg (10 mg Intravenous Given 07/08/24 1248)  ketorolac (TORADOL) 15 MG/ML injection 15 mg (15 mg Intravenous Given 07/08/24 1321)     IMPRESSION / MDM / ASSESSMENT AND PLAN / ED COURSE  I reviewed the triage vital signs and the nursing notes.                              Differential diagnosis includes, but is not limited to, sinusitis, covid, flu, electrolyte abnormality, brain mass, meningitis  Patient's presentation is most consistent with acute complicated illness / injury requiring diagnostic workup.  Respiratory panel negative for COVID, flu, RSV.  Blood cell count normal at 4.7.  Platelets decreased at 107; prior labs show results of between 30 and 70 platelets so this is much improved compared to prior results. CMP with mildly decreased sodium 133. CXR consistent with chronic lung emphysema. I independently viewed the x-ray and radiologist's report.  I agree with the radiologist's report that there are no acute findings.  Patient given headache cocktail and is feeling no pain now. CT head with mild to moderate paranasal sinus inflammation, no acute soft tissue findings,  infarct or hemorrhage.  Patient has been having symptoms for 7 days, so is not yet able to receive antibiotics due to needing to be symptomatic for 10 days.  Did send Flonase  Rx for her and discussed OTC medication use for recurrent headaches.  Will have her follow-up with her PCP following today's visit.  The patient may return to the emergency department for any new, worsening, or concerning symptoms. Patient was given the opportunity to ask questions; all questions were answered. Emergency department return precautions were discussed with the patient.  Patient is in agreement to the treatment plan.  Patient is stable for discharge.   FINAL CLINICAL IMPRESSION(S) / ED DIAGNOSES   Final diagnoses:  Other headache syndrome  Sinus pain     Rx / DC Orders   ED Discharge Orders          Ordered    fluticasone  (FLONASE ) 50 MCG/ACT nasal spray  Daily  07/08/24 1426             Note:  This document was prepared using Dragon voice recognition software and may include unintentional dictation errors.     Sheron Salm, PA-C 07/08/24 1537    Levander Slate, MD 07/09/24 1224

## 2024-07-08 NOTE — ED Notes (Signed)
 See triage note  Presents with headache  Pain is mainly to right side of face and cough  States she has had some relief with nasal spray and OTC meds

## 2024-07-08 NOTE — Discharge Instructions (Signed)
 You have been seen in the Emergency Department (ED) for a headache and sinus pain. Please pick up and take the nasal steroid as prescribed. As we have discussed, please follow up with your primary care doctor as soon as possible regarding today's Emergency Department (ED) visit and your headache symptoms. Let your doctor know if you have pain in your sinuses for 3 more days or longer.  Call your doctor or return to the ED if you have a worsening headache, sudden and severe headache, confusion, slurred speech, facial droop, weakness or numbness in any arm or leg, extreme fatigue, vision problems, or other symptoms that concern you.

## 2024-07-10 ENCOUNTER — Encounter

## 2024-07-12 ENCOUNTER — Encounter

## 2024-07-15 ENCOUNTER — Encounter: Attending: Internal Medicine | Admitting: *Deleted

## 2024-07-15 DIAGNOSIS — R0609 Other forms of dyspnea: Secondary | ICD-10-CM | POA: Diagnosis present

## 2024-07-15 NOTE — Progress Notes (Signed)
 Daily Session Note  Patient Details  Name: Michelle Acevedo MRN: [id number] Date of Birth: Nov 21, 1950 Referring Provider:   Flowsheet Row Pulmonary Rehab from 02/16/2024 in [facility] Cardiac and Pulmonary Rehab  Referring Provider Dr. Dickey Orem    Encounter Date: 07/15/2024  Check In:  Session Check In - 07/15/24 1111       Check-In   Supervising physician immediately available to respond to emergencies See telemetry face sheet for immediately available ER MD    Location [facility]-Cardiac & Pulmonary Rehab    Staff Present Othel Durand, RN, BSN, CCRP;Laura Cates RN,BSN;Joseph San[employer]-sci BS, ACSM CEP, Exercise Physiologist    Virtual Visit No    Medication changes reported     No    Fall or balance concerns reported    No    Warm-up and Cool-down Performed on first and last piece of equipment    Resistance Training Performed Yes    VAD Patient? No    PAD/SET Patient? No      Pain Assessment   Currently in Pain? No/denies             Social History   Tobacco Use  Smoking Status Former   Current packs/day: 1.50   Average packs/day: 1.5 packs/day for 30.0 years (45.0 ttl pk-yrs)   Types: Cigarettes  Smokeless Tobacco Never  Tobacco Comments   Quit in 2021.    Goals Met:  Proper associated with RPD/PD & O2 Sat Independence with exercise equipment Exercise tolerated well No report of concerns or symptoms today  Goals Unmet:  Not Applicable  Comments: Pt able to follow exercise prescription today without complaint.  Will continue to monitor for progression.    Dr. Oneil Pinal is Medical Director for [facility] Cardiac Rehabilitation.  Dr. Fuad Aleskerov is Medical Director for [facility] Pulmonary Rehabilitation.

## 2024-07-17 ENCOUNTER — Encounter: Admitting: Emergency Medicine

## 2024-07-17 DIAGNOSIS — R0609 Other forms of dyspnea: Secondary | ICD-10-CM | POA: Diagnosis not present

## 2024-07-17 NOTE — Progress Notes (Signed)
 Daily Session Note  Patient Details  Name: Michelle Acevedo MRN: [id number] Date of Birth: Jan 19, 1951 Referring Provider:   Flowsheet Row Pulmonary Rehab from 02/16/2024 in [facility]monary Rehab  Referring Provider Dr. Dickey Orem    Encounter Date: 07/17/2024  Check In:  Session Check In - 07/17/24 1110       Check-In   Supervising physician immediately available to respond to emergencies See telemetry face sheet for immediately available ER MD    Location [facility]-Cardiac & Pulmonary Rehab    Staff Present Leita Franks RN,BSN;Joseph [facility] RCP,RRT,BSRT;Margaret Best, MS, Exercise Physiologist;Maxon Conetta BS, Exercise Physiologist    Virtual Visit No    Medication changes reported     No    Fall or balance concerns reported    No    Tobacco Cessation No Change    Warm-up and Cool-down Performed on first and last piece of equipment    Resistance Training Performed Yes    VAD Patient? No    PAD/SET Patient? No      Pain Assessment   Currently in Pain? No/denies             Social History   Tobacco Use  Smoking Status Former   Current packs/day: 1.50   Average packs/day: 1.5 packs/day for 30.0 years (45.0 ttl pk-yrs)   Types: Cigarettes  Smokeless Tobacco Never  Tobacco Comments   Quit in 2021.    Goals Met:  Proper associated with RPD/PD & O2 Sat Independence with exercise equipment Using PLB without cueing & demonstrates good technique Exercise tolerated well No report of concerns or symptoms today Strength training completed today  Goals Unmet:  Not Applicable  Comments: Pt able to follow exercise prescription today without complaint.  Will continue to monitor for progression.    Dr. Oneil Pinal is Medical Director for [facility] Cardiac Rehabilitation.  Dr. Fuad Aleskerov is Medical Director for [facility] Pulmonary Rehabilitation.

## 2024-07-19 ENCOUNTER — Encounter

## 2024-07-22 ENCOUNTER — Encounter

## 2024-07-22 DIAGNOSIS — R0609 Other forms of dyspnea: Secondary | ICD-10-CM

## 2024-07-22 NOTE — Progress Notes (Signed)
 Daily Session Note  Patient Details  Name: Michelle Acevedo MRN: [id number] Date of Birth: October 28, 1950 Referring Provider:   Flowsheet Row Pulmonary Rehab from 02/16/2024 in [facility] Cardiac and Pulmonary Rehab  Referring Provider Dr. Dickey Orem    Encounter Date: 07/22/2024  Check In:  Session Check In - 07/22/24 1107       Check-In   Supervising physician immediately available to respond to emergencies See telemetry face sheet for immediately available ER MD    Location [facility]-Cardiac & Pulmonary Rehab    Staff Present Burnard Hint BS, ACSM CEP, Exercise Physiologist;Joseph Rolinda RCP,RRT,BSRT;Laura Cates RN,BSN;Skyla Champagne RN,BSN,MPA;Mary Godley, RN, DNP, NE-BC    Virtual Visit No    Medication changes reported     No    Fall or balance concerns reported    No    Tobacco Cessation No Change    Warm-up and Cool-down Performed on first and last piece of equipment    Resistance Training Performed Yes    VAD Patient? No    PAD/SET Patient? No      Pain Assessment   Currently in Pain? No/denies             Social History   Tobacco Use  Smoking Status Former   Current packs/day: 1.50   Average packs/day: 1.5 packs/day for 30.0 years (45.0 ttl pk-yrs)   Types: Cigarettes  Smokeless Tobacco Never  Tobacco Comments   Quit in 2021.    Goals Met:  Proper associated with RPD/PD & O2 Sat Independence with exercise equipment Using PLB without cueing & demonstrates good technique Exercise tolerated well No report of concerns or symptoms today Strength training completed today  Goals Unmet:  Not Applicable  Comments: Pt able to follow exercise prescription today without complaint.  Will continue to monitor for progression.    Dr. Oneil Pinal is Medical Director for [facility] Cardiac Rehabilitation.  Dr. Fuad Aleskerov is Medical Director for [facility] Pulmonary Rehabilitation.

## 2024-07-24 ENCOUNTER — Encounter

## 2024-07-26 ENCOUNTER — Encounter

## 2024-07-29 ENCOUNTER — Encounter

## 2024-07-31 ENCOUNTER — Encounter: Admitting: *Deleted

## 2024-07-31 DIAGNOSIS — R0609 Other forms of dyspnea: Secondary | ICD-10-CM

## 2024-07-31 NOTE — Progress Notes (Signed)
 Daily Session Note  Patient Details  Name: Michelle Acevedo MRN: [id number] Date of Birth: [birth year] Referring Provider:   Flowsheet Row Pulmonary Rehab from 02/16/2024 in [facility] Cardiac and Pulmonary Rehab  Referring Provider Dr. Dickey Orem    Encounter Date: 07/31/2024  Check In:  Session Check In - 07/31/24 1137       Check-In   Supervising physician immediately available to respond to emergencies See telemetry face sheet for immediately available ER MD    Location [facility]-Cardiac & Pulmonary Rehab    Staff Present Rollene Paterson, MS, Exercise Physiologist;Noah Tickle, BS, Exercise Physiologist;Laura Cates RN,BSN;Icel Castles, RN, DNP, NE-BC;Kelly Bollinger RN,BSN,MPA    Virtual Visit No    Medication changes reported     No    Fall or balance concerns reported    No    Warm-up and Cool-down Performed on first and last piece of equipment    Resistance Training Performed Yes    VAD Patient? No    PAD/SET Patient? No      Pain Assessment   Currently in Pain? No/denies             Social History   Tobacco Use  Smoking Status Former   Current packs/day: 1.50   Average packs/day: 1.5 packs/day for 30.0 years (45.0 ttl pk-yrs)   Types: Cigarettes  Smokeless Tobacco Never  Tobacco Comments   Quit in 2021.    Goals Met:  Proper associated with RPD/PD & O2 Sat Independence with exercise equipment Using PLB without cueing & demonstrates good technique Exercise tolerated well No report of concerns or symptoms today Strength training completed today  Goals Unmet:  Not Applicable  Comments: Pt able to follow exercise prescription today without complaint.  Will continue to monitor for progression.    Dr. Oneil Pinal is Medical Director for [facility] Cardiac Rehabilitation.  Dr. Fuad Aleskerov is Medical Director for [facility] Pulmonary Rehabilitation.

## 2024-07-31 NOTE — Progress Notes (Signed)
 Pulmonary Individual Treatment Plan  Patient Details  Name: Michelle Acevedo MRN: [id number] Date of Birth: [birth year] Referring Provider:   Flowsheet Row Pulmonary Rehab from 02/16/2024 in [facility] Cardiac and Pulmonary Rehab  Referring Provider Dr. Dickey Orem    Initial Encounter Date:  Flowsheet Row Pulmonary Rehab from 02/16/2024 in [facility] Cardiac and Pulmonary Rehab  Date 02/16/24    Visit Diagnosis: DOE (dyspnea on exertion)  Patient's Home Medications on Admission:  Current Outpatient Medications:    acetaminophen  (TYLENOL ) 325 MG tablet, Take 325 mg by mouth., Disp: , Rfl:    acetaminophen  (TYLENOL ) 500 MG tablet, Take 500 mg by mouth every 6 (six) hours as needed for moderate pain or headache., Disp: , Rfl:    albuterol  (VENTOLIN  HFA) 108 (90 Base) MCG/ACT inhaler, Inhale 1-2 puffs into the lungs every 6 (six) hours as needed for wheezing or shortness of breath. (Patient not taking: Reported on 02/14/2024), Disp: 18 g, Rfl: 0   albuterol  (VENTOLIN  HFA) 108 (90 Base) MCG/ACT inhaler, Inhale 2 puffs into the lungs., Disp: , Rfl:    Albuterol  Sulfate (PROAIR  RESPICLICK) 108 (90 Base) MCG/ACT AEPB, Inhale 2 puffs into the lungs., Disp: , Rfl:    fluticasone  (FLONASE ) 50 MCG/ACT nasal spray, Place 1 spray into both nostrils daily for 7 days., Disp: 1 g, Rfl: 0   furosemide (LASIX) 40 MG tablet, Take 40 mg by mouth daily.  (Patient not taking: Reported on 02/14/2024), Disp: , Rfl:    Ipratropium-Albuterol  (COMBIVENT) 20-100 MCG/ACT AERS respimat, Inhale 2 puffs into the lungs 3 (three) times daily as needed for shortness of breath., Disp: , Rfl:    lactulose (CHRONULAC) 10 GM/15ML solution, Take 10 g by mouth 3 (three) times daily as needed (for elevated ammonia level)., Disp: , Rfl:    NON FORMULARY, B3, Disp: , Rfl:    oxyCODONE  (ROXICODONE ) 5 MG immediate release tablet, Take 1 tablet (5 mg total) by mouth every 8 (eight) hours as needed for up to 16 doses for breakthrough pain or severe pain.  (Patient not taking: Reported on 02/14/2024), Disp: 16 tablet, Rfl: 0   pantoprazole (PROTONIX) 40 MG tablet, Take 40 mg by mouth every morning.  (Patient not taking: Reported on 02/14/2024), Disp: , Rfl:    pantoprazole (PROTONIX) 40 MG tablet, Take 40 mg by mouth., Disp: , Rfl:    Potassium 99 MG TABS, Take 99 mg by mouth daily., Disp: , Rfl:    rifaximin (XIFAXAN) 550 MG TABS tablet, Take 550 mg by mouth every morning.  (Patient not taking: Reported on 02/14/2024), Disp: , Rfl:    spironolactone (ALDACTONE) 100 MG tablet, Take 100 mg by mouth daily. , Disp: , Rfl:    tacrolimus (PROGRAF) 5 MG capsule, Take 25 mg by mouth 2 (two) times daily., Disp: , Rfl:    traZODone (DESYREL) 50 MG tablet, Take 50 mg by mouth at bedtime., Disp: , Rfl:   Past Medical History: Past Medical History:  Diagnosis Date   Anemia    Arthritis    KNEE KLEFT   Cancer (HCC)    Labia   Chronic hepatitis C with cirrhosis (HCC)    Cirrhosis (HCC)    Emphysema of lung (HCC)    mild   GERD (gastroesophageal reflux disease)    Hepatitis C    History of esophageal varices    Hx of transfusion of whole blood    Macular degeneration    LEFT EYE-RECEIVES INJECTION IN LEFT EYE FOR THIS EVERY 9 WEEKS  Tobacco Use: Social History   Tobacco Use  Smoking Status Former   Current packs/day: 1.50   Average packs/day: 1.5 packs/day for 30.0 years (45.0 ttl pk-yrs)   Types: Cigarettes  Smokeless Tobacco Never  Tobacco Comments   Quit in 2021.    Labs: Review Flowsheet        No data to display           Pulmonary Assessment Scores:  Pulmonary Assessment Scores     Row Name 02/14/24 1352 02/14/24 1450       ADL UCSD   ADL Phase Entry Entry    SOB Score total -- 3    Rest -- 0    Walk -- 0    Stairs -- 3    Bath -- 0    Dress -- 0    Shop -- 0      CAT Score   CAT Score 21 --       UCSD: Self-administered rating of dyspnea associated with activities of daily living (ADLs) 6-point scale (0 =  not at all to 5 = maximal or unable to do because of breathlessness)  Scoring Scores range from 0 to 120.  Minimally important difference is 5 units  CAT: CAT can identify the health impairment of COPD patients and is better correlated with disease progression.  CAT has a scoring range of zero to 40. The CAT score is classified into four groups of low (less than 10), medium (10 - 20), high (21-30) and very high (31-40) based on the impact level of disease on health status. A CAT score over 10 suggests significant symptoms.  A worsening CAT score could be explained by an exacerbation, poor medication adherence, poor inhaler technique, or progression of COPD or comorbid conditions.  CAT MCID is 2 points  mMRC: mMRC (Modified Medical Research Council) Dyspnea Scale is used to assess the degree of baseline functional disability in patients of respiratory disease due to dyspnea. No minimal important difference is established. A decrease in score of 1 point or greater is considered a positive change.   Pulmonary Function Assessment:  Pulmonary Function Assessment - 02/14/24 1341       Breath   Shortness of Breath Yes;Limiting activity          Exercise Target Goals: Exercise Program Goal: Individual exercise prescription set using results from initial 6 min walk test and THRR while considering  patient's activity barriers and safety.   Exercise Prescription Goal: Initial exercise prescription builds to 30-45 minutes a day of aerobic activity, 2-3 days per week.  Home exercise guidelines will be given to patient during program as part of exercise prescription that the participant will acknowledge.  Education: Aerobic Exercise: - Group verbal and visual presentation on the components of exercise prescription. Introduces F.I.T.T principle from ACSM for exercise prescriptions.  Reviews F.I.T.T. principles of aerobic exercise including progression. Written material provided at class  time.   Education: Resistance Exercise: - Group verbal and visual presentation on the components of exercise prescription. Introduces F.I.T.T principle from ACSM for exercise prescriptions  Reviews F.I.T.T. principles of resistance exercise including progression. Written material provided at class time. Flowsheet Row Pulmonary Rehab from 06/19/2024 in [facility] Cardiac and Pulmonary Rehab  Date 06/19/24  Educator nt  Instruction Review Code 1- Tefl Teacher Understanding     Education: Exercise & Equipment Safety: - Individual verbal instruction and demonstration of equipment use and safety with use of the equipment. Flowsheet Row Pulmonary Rehab from 06/19/2024 in  [facility] Cardiac and Pulmonary Rehab  Date 02/14/24  Educator [facility]  Instruction Review Code 1- Verbalizes Understanding    Education: Exercise Physiology & General Exercise Guidelines: - Group verbal and written instruction with models to review the exercise physiology of the cardiovascular system and associated critical values. Provides general exercise guidelines with specific guidelines to those with heart or lung disease.  Flowsheet Row Pulmonary Rehab from 06/19/2024 in [facility] Cardiac and Pulmonary Rehab  Education need identified 02/14/24    Education: Flexibility, Balance, Mind/Body Relaxation: - Group verbal and visual presentation with interactive activity on the components of exercise prescription. Introduces F.I.T.T principle from ACSM for exercise prescriptions. Reviews F.I.T.T. principles of flexibility and balance exercise training including progression. Also discusses the mind body connection.  Reviews various relaxation techniques to help reduce and manage stress (i.e. Deep breathing, progressive muscle relaxation, and visualization). Balance handout provided to take home. Written material provided at class time. Flowsheet Row Pulmonary Rehab from 06/19/2024 in [facility] Cardiac and Pulmonary Rehab  Date 06/19/24  Educator nt   Instruction Review Code 1- Verbalizes Understanding    Activity Barriers & Risk Stratification:  Activity Barriers & Cardiac Risk Stratification - 02/16/24 1121       Activity Barriers & Cardiac Risk Stratification   Activity Barriers Joint Problems;Shortness of Breath          6 Minute Walk:  6 Minute Walk     Row Name 02/16/24 1116         6 Minute Walk   Phase Initial     Distance 1480 feet     Walk Time 6 minutes     # of Rest Breaks 0     MPH 2.8     METS 2.9     RPE 9     Perceived Dyspnea  0     VO2 Peak 10.8     Symptoms No     Resting HR 86 bpm     Resting BP 128/60     Resting Oxygen Saturation  95 %     Exercise Oxygen Saturation  during 6 min walk 92 %     Max Ex. HR 115 bpm     Max Ex. BP 144/68     2 Minute Post BP 124/62       Interval HR   1 Minute HR 100     2 Minute HR 115     3 Minute HR 115     4 Minute HR 71     5 Minute HR 115     6 Minute HR 111     2 Minute Post HR 89     Interval Heart Rate? Yes       Interval Oxygen   Interval Oxygen? Yes     Baseline Oxygen Saturation % 95 %     1 Minute Oxygen Saturation % 95 %     1 Minute Liters of Oxygen 0 L     2 Minute Oxygen Saturation % 98 %     2 Minute Liters of Oxygen 0 L     3 Minute Oxygen Saturation % 93 %     3 Minute Liters of Oxygen 0 L     4 Minute Oxygen Saturation % 92 %     4 Minute Liters of Oxygen 0 L     5 Minute Oxygen Saturation % 97 %     5 Minute Liters of Oxygen 0 L     6  Minute Oxygen Saturation % 97 %     6 Minute Liters of Oxygen 0 L     2 Minute Post Oxygen Saturation % 98 %     2 Minute Post Liters of Oxygen 0 L       Oxygen Initial Assessment:  Oxygen Initial Assessment - 02/14/24 1341       Home Oxygen   Home Oxygen Device None    Sleep Oxygen Prescription None    Home Exercise Oxygen Prescription None    Home Resting Oxygen Prescription None      Initial 6 min Walk   Oxygen Used None      Program Oxygen Prescription   Program Oxygen  Prescription None      Intervention   Short Term Goals To learn and exhibit compliance with exercise, home and travel O2 prescription;To learn and understand importance of monitoring SPO2 with pulse oximeter and demonstrate accurate use of the pulse oximeter.;To learn and understand importance of maintaining oxygen saturations>88%;To learn and demonstrate proper pursed lip breathing techniques or other breathing techniques. ;To learn and demonstrate proper use of respiratory medications    Long  Term Goals Exhibits compliance with exercise, home  and travel O2 prescription;Verbalizes importance of monitoring SPO2 with pulse oximeter and return demonstration;Maintenance of O2 saturations>88%;Exhibits proper breathing techniques, such as pursed lip breathing or other method taught during program session;Compliance with respiratory medication;Demonstrates proper use of MDI's          Oxygen Re-Evaluation:  Oxygen Re-Evaluation     Row Name 02/16/24 1108 03/08/24 1116 04/24/24 1110 06/17/24 1123 07/15/24 1110     Program Oxygen Prescription   Program Oxygen Prescription None None None None None     Home Oxygen   Home Oxygen Device None None None None None   Sleep Oxygen Prescription None None None None None   Home Exercise Oxygen Prescription None None None None None   Home Resting Oxygen Prescription None None None None None     Goals/Expected Outcomes   Short Term Goals To learn and exhibit compliance with exercise, home and travel O2 prescription;To learn and understand importance of monitoring SPO2 with pulse oximeter and demonstrate accurate use of the pulse oximeter.;To learn and understand importance of maintaining oxygen saturations>88%;To learn and demonstrate proper pursed lip breathing techniques or other breathing techniques. ;To learn and demonstrate proper use of respiratory medications To learn and demonstrate proper pursed lip breathing techniques or other breathing techniques.   To learn and understand importance of maintaining oxygen saturations>88%;To learn and understand importance of monitoring SPO2 with pulse oximeter and demonstrate accurate use of the pulse oximeter. To learn and understand importance of monitoring SPO2 with pulse oximeter and demonstrate accurate use of the pulse oximeter.;To learn and understand importance of maintaining oxygen saturations>88% To learn and demonstrate proper use of respiratory medications   Long  Term Goals Exhibits compliance with exercise, home  and travel O2 prescription;Verbalizes importance of monitoring SPO2 with pulse oximeter and return demonstration;Maintenance of O2 saturations>88%;Exhibits proper breathing techniques, such as pursed lip breathing or other method taught during program session;Compliance with respiratory medication;Demonstrates proper use of MDI's Exhibits proper breathing techniques, such as pursed lip breathing or other method taught during program session Verbalizes importance of monitoring SPO2 with pulse oximeter and return demonstration;Maintenance of O2 saturations>88% Maintenance of O2 saturations>88%;Verbalizes importance of monitoring SPO2 with pulse oximeter and return demonstration Demonstrates proper use of MDI's   Comments Reviewed PLB technique with pt.  Talked about how  it works and it's importance in maintaining their exercise saturations. Informed patient how to perform the Pursed Lipped breathing technique. Told patient to Inhale through the nose and out the mouth with pursed lips to keep their airways open, help oxygenate them better, practice when at rest or doing strenuous activity. Patient Verbalizes understanding of technique and will work on and be reiterated during [facility]. She does not have a pulse oximeter to check her oxygen saturation at home. Informed her where to get one and explained why it is important to have one. Reviewed that oxygen saturations should be 88 percent and above. She  does not have a pulse oximeter to check her oxygen saturation at home. Informed her where to get one and explained why it is important to have one. Reviewed that oxygen saturations should be 88 percent and above. Makeisha takes her inhalers as needed. She takes albuterol  when she feels short of breath or wheezy. She does not have any other medications for her respiratory issues. She does not want to get a pulse oximete to check her oxygen at home.   Goals/Expected Outcomes Short: Become more profiecient at using PLB. Long: Become independent at using PLB. Short: use PLB with exertion. Long: use PLB on exertion proficiently and independently. Short: monitor oxygen at home with exertion. Long: maintain oxygen saturations above 88 percent independently. Short: monitor oxygen at home with exertion. Long: maintain oxygen saturations above 88 percent independently. Short: continue medications as needed. Long: maintain medications independently.      Oxygen Discharge (Final Oxygen Re-Evaluation):  Oxygen Re-Evaluation - 07/15/24 1110       Program Oxygen Prescription   Program Oxygen Prescription None      Home Oxygen   Home Oxygen Device None    Sleep Oxygen Prescription None    Home Exercise Oxygen Prescription None    Home Resting Oxygen Prescription None      Goals/Expected Outcomes   Short Term Goals To learn and demonstrate proper use of respiratory medications    Long  Term Goals Demonstrates proper use of MDI's    Comments Lynnet takes her inhalers as needed. She takes albuterol  when she feels short of breath or wheezy. She does not have any other medications for her respiratory issues. She does not want to get a pulse oximete to check her oxygen at home.    Goals/Expected Outcomes Short: continue medications as needed. Long: maintain medications independently.          Initial Exercise Prescription:  Initial Exercise Prescription - 02/16/24 1100       Date of Initial Exercise RX and  Referring Provider   Date 02/16/24    Referring Provider Dr. Dickey Orem      Oxygen   Maintain Oxygen Saturation 88% or higher      Treadmill   MPH 2.8    Grade 0    Minutes 15    METs 2.9      Recumbant Bike   Level 3    RPM 50    Watts 25    Minutes 15    METs 2.9      NuStep   Level 3    SPM 80    Minutes 15    METs 2.9      Rower   Level 1    Watts 25    Minutes 15    METs 2.9      Intensity   THRR 40-80% of Max Heartrate 109-133  Ratings of Perceived Exertion 11-13    Perceived Dyspnea 0-4      Resistance Training   Training Prescription Yes    Weight 3lb    Reps 10-15          Perform Capillary Blood Glucose checks as needed.  Exercise Prescription Changes:   Exercise Prescription Changes     Row Name 02/16/24 1100 02/28/24 1400 03/12/24 1300 05/09/24 1600 05/21/24 0800     Response to Exercise   Blood Pressure (Admit) 128/60 118/72 134/60 104/62 110/72   Blood Pressure (Exercise) 144/68 136/72 146/60 -- --   Blood Pressure (Exit) 124/62 120/68 118/62 110/58 108/58   Heart Rate (Admit) 86 bpm 79 bpm 85 bpm 78 bpm 89 bpm   Heart Rate (Exercise) 115 bpm 103 bpm 108 bpm 112 bpm 112 bpm   Heart Rate (Exit) 89 bpm 91 bpm 97 bpm 80 bpm 79 bpm   Oxygen Saturation (Admit) 95 % 94 % 96 % 95 % 96 %   Oxygen Saturation (Exercise) 92 % 93 % 90 % 93 % 95 %   Oxygen Saturation (Exit) 98 % 96 % 94 % 96 % 94 %   Rating of Perceived Exertion (Exercise) 9 11 12 13 11    Perceived Dyspnea (Exercise) 0 0 1 1 1    Symptoms R knee pain none none none none   Comments results first 2 weeks of exercise -- -- --   Duration -- Progress to 30 minutes of  aerobic without signs/symptoms of physical distress Continue with 30 min of aerobic exercise without signs/symptoms of physical distress. Continue with 30 min of aerobic exercise without signs/symptoms of physical distress. Continue with 30 min of aerobic exercise without signs/symptoms of physical distress.    Intensity -- THRR unchanged THRR unchanged THRR unchanged THRR unchanged     Progression   Progression -- Continue to progress workloads to maintain intensity without signs/symptoms of physical distress. Continue to progress workloads to maintain intensity without signs/symptoms of physical distress. Continue to progress workloads to maintain intensity without signs/symptoms of physical distress. Continue to progress workloads to maintain intensity without signs/symptoms of physical distress.   Average METs -- 2.89 2.94 2.98 4.01     Resistance Training   Training Prescription -- Yes Yes Yes Yes   Weight -- 3 4 lb 4 lb 4 lb   Reps -- 10-15 10-15 10-15 10-15     Interval Training   Interval Training -- No No No No     Treadmill   MPH -- 2.3 2.5 2.5 2.5   Grade -- 0 0 0 0   Minutes -- 15 15 15 15    METs -- 2.76 2.91 2.91 2.91     Recumbant Bike   Level -- 2 3 -- --   Watts -- 25 30 -- --   Minutes -- 15 15 -- --   METs -- 3.04 3.03 -- --     NuStep   Level -- -- 1  T6 nustep: level 2 2 2    Minutes -- -- 15 15 15    METs -- -- --  T6 nustep: 3.3 mets 4 5.1     T5 Nustep   Level -- -- -- 1  T6 --   SPM -- -- -- 80 --   Minutes -- -- -- 15 --   METs -- -- -- 2.2 --     Biostep-RELP   Level -- 1 -- -- --   Minutes -- 15 -- -- --  METs -- 3 -- -- --     Oxygen   Maintain Oxygen Saturation -- 88% or higher 88% or higher 88% or higher 88% or higher    Row Name 07/02/24 1500 07/17/24 1100           Response to Exercise   Blood Pressure (Admit) 118/62 --      Blood Pressure (Exit) 122/64 --      Heart Rate (Admit) 81 bpm --      Heart Rate (Exercise) 107 bpm --      Heart Rate (Exit) 98 bpm --      Oxygen Saturation (Admit) 92 % --      Oxygen Saturation (Exercise) 93 % --      Oxygen Saturation (Exit) 93 % --      Rating of Perceived Exertion (Exercise) 12 --      Perceived Dyspnea (Exercise) 1 --      Symptoms none --      Duration Continue with 30 min of aerobic  exercise without signs/symptoms of physical distress. --      Intensity THRR unchanged --        Progression   Progression Continue to progress workloads to maintain intensity without signs/symptoms of physical distress. --      Average METs 2.9 --        Resistance Training   Training Prescription Yes --      Weight 4 lb --      Reps 10-15 --        Interval Training   Interval Training No --        Treadmill   MPH 2.5 --      Grade 0 --      Minutes 15 --      METs 2.91 --        NuStep   Level 2 --      Minutes 15 --      METs 4 --        Biostep-RELP   Level 1 --      Minutes 15 --      METs 2 --        Home Exercise Plan   Plans to continue exercise at -- Home (comment)  [name] plans to walk her dogs outside for at least 30 minutes, as well as use her treadmill      Frequency -- Add 2 additional days to program exercise sessions.      Initial Home Exercises Provided -- 07/17/24        Oxygen   Maintain Oxygen Saturation 88% or higher --         Exercise Comments:   Exercise Comments     Row Name 02/16/24 1107           Exercise Comments First full day of exercise!  Patient was oriented to gym and equipment including functions, settings, policies, and procedures.  Patient's individual exercise prescription and treatment plan were reviewed.  All starting workloads were established based on the results of the 6 minute walk test done at initial orientation visit.  The plan for exercise progression was also introduced and progression will be customized based on patient's performance and goals.          Exercise Goals and Review:   Exercise Goals     Row Name 02/16/24 1130             Exercise Goals   Increase Physical Activity Yes  Intervention Provide advice, education, support and counseling about physical activity/exercise needs.;Develop an individualized exercise prescription for aerobic and resistive training based on initial evaluation  findings, risk stratification, comorbidities and participant's personal goals.       Expected Outcomes Short Term: Attend rehab on a regular basis to increase amount of physical activity.;Long Term: Add in home exercise to make exercise part of routine and to increase amount of physical activity.;Long Term: Exercising regularly at least 3-5 days a week.       Increase Strength and Stamina Yes       Intervention Provide advice, education, support and counseling about physical activity/exercise needs.;Develop an individualized exercise prescription for aerobic and resistive training based on initial evaluation findings, risk stratification, comorbidities and participant's personal goals.       Expected Outcomes Short Term: Increase workloads from initial exercise prescription for resistance, speed, and METs.;Short Term: Perform resistance training exercises routinely during rehab and add in resistance training at home;Long Term: Improve cardiorespiratory fitness, muscular endurance and strength as measured by increased METs and functional capacity ( )       Able to understand and use rate of perceived exertion (RPE) scale Yes       Intervention Provide education and explanation on how to use RPE scale       Expected Outcomes Short Term: Able to use RPE daily in rehab to express subjective intensity level;Long Term:  Able to use RPE to guide intensity level when exercising independently       Able to understand and use Dyspnea scale Yes       Intervention Provide education and explanation on how to use Dyspnea scale       Expected Outcomes Short Term: Able to use Dyspnea scale daily in rehab to express subjective sense of shortness of breath during exertion;Long Term: Able to use Dyspnea scale to guide intensity level when exercising independently       Knowledge and understanding of Target Heart Rate Range (THRR) Yes       Intervention Provide education and explanation of THRR including how the numbers  were predicted and where they are located for reference       Expected Outcomes Short Term: Able to state/look up THRR;Short Term: Able to use daily as guideline for intensity in rehab;Long Term: Able to use THRR to govern intensity when exercising independently       Able to check pulse independently Yes       Intervention Provide education and demonstration on how to check pulse in carotid and radial arteries.;Review the importance of being able to check your own pulse for safety during independent exercise       Expected Outcomes Short Term: Able to explain why pulse checking is important during independent exercise;Long Term: Able to check pulse independently and accurately       Understanding of Exercise Prescription Yes       Intervention Provide education, explanation, and written materials on patient's individual exercise prescription       Expected Outcomes Short Term: Able to explain program exercise prescription;Long Term: Able to explain home exercise prescription to exercise independently          Exercise Goals Re-Evaluation :  Exercise Goals Re-Evaluation     Row Name 02/16/24 1107 02/28/24 1449 03/12/24 1359 04/25/24 1404 05/09/24 1640     Exercise Goal Re-Evaluation   Exercise Goals Review Able to understand and use rate of perceived exertion (RPE) scale;Knowledge and understanding of Target Heart Rate  Range (THRR);Understanding of Exercise Prescription;Increase Physical Activity;Increase Strength and Stamina;Able to understand and use Dyspnea scale;Able to check pulse independently Increase Strength and Stamina;Increase Physical Activity;Understanding of Exercise Prescription Increase Strength and Stamina;Increase Physical Activity;Understanding of Exercise Prescription Increase Strength and Stamina;Increase Physical Activity;Understanding of Exercise Prescription Increase Strength and Stamina;Increase Physical Activity;Understanding of Exercise Prescription   Comments Reviewed  RPE and dyspnea scale, THR and program prescription with pt today.  Pt voiced understanding and was given a copy of goals to take home. Rhoda is off to a good start in the program. She was able to attend her first 2 sessions during this review period. During her sessions she was able to use the treadmill at a speed of 2. and no incline, and the recumbent bike at level 2. We will continue to monitor her progress in the program. Rhoda is doing well in rehab. She increased her workload on the treadmill to a speed of 2.5 mph with no incline. She also improved to level 3 on the recumbent bike and increased to 4 lb handweights for resistance training. We will continue to monitor her progress in the program. Rhoda has not attended rehab since the last review due to personal reasons. We will continue to monitor her progress when she returns to the program. Rhoda is doing well in rehab. She returned to rehab in this review. She maintained a speed of 2.5 mph on the treadmill with no incline. She increased to level 2 on the T4 nustep. She maintained level 1 on the T6 nustep. We will continue to monitor her progress in the program.   Expected Outcomes Short: Use RPE daily to regulate intensity. Long: Follow program prescription in THR. Short: Continue to follow exercise prescription. Long: Continue exercise to improve strength and stamina. Short: Continue to progressively increase treadmill workload. Long: Continue exercise to improve strength and stamina. Short: Return to rehab when appropriate. Long: Graduate. Short: Continue to progressively increase treadmill workload. Long: Continue exercise to improve strength and stamina.    Row Name 05/21/24 9164 06/04/24 1514 06/19/24 1545 07/02/24 1508 07/16/24 1515     Exercise Goal Re-Evaluation   Exercise Goals Review Increase Strength and Stamina;Increase Physical Activity;Understanding of Exercise Prescription Increase Strength and Stamina;Increase Physical  Activity;Understanding of Exercise Prescription Increase Strength and Stamina;Increase Physical Activity;Understanding of Exercise Prescription Increase Strength and Stamina;Increase Physical Activity;Understanding of Exercise Prescription Increase Strength and Stamina;Increase Physical Activity;Understanding of Exercise Prescription   Comments Rhoda has only attended one session since the last review. During her one session she continued to work at level 2 on the T4 nustep. She also has maintained her treadmill workload at a speed of 2.5 mph with no incline. We will continue to monitor her progress in the program. Rhoda has not attended rehab since the last review. She last attended rehab on 05/06/2024. We will reach out to her to see when she plans to return. We will continue to monitor her progress when she returns to the program. Rhoda did not attend rehab during the last review period. She did return to rehab on 06/17/2024 and did well with exercise. We will continue to monitor her progress in the program. Rhoda only attended rehab twice during the last review period. She continues to do well walking at 2.5 mph with no incline on the treadmill. She also continues to do well on the T4 nustep and biostep. We will continue to monitor her progress in the program. Rhoda did not attend rehab during the last review period.  She has since returned to her regular attendance in the program. We will continue to monitor her progress in the program.   Expected Outcomes Short: Attend rehab more consistently. Long: Continue exercise to improve strength and stamina. Short: Return to rehab when appropriate. Long: Graduate. Short: Continue to attend rehab consistently. Long: Continue exercise to improve strength and stamina. Short: Attend rehab more consistently. Long: Continue exercise to improve strength and stamina. Short: Attend rehab more consistently. Long: Continue exercise to improve strength and stamina.    Row Name  07/17/24 1131             Exercise Goal Re-Evaluation   Exercise Goals Review Increase Physical Activity;Able to understand and use Dyspnea scale;Understanding of Exercise Prescription;Increase Strength and Stamina;Knowledge and understanding of Target Heart Rate Range (THRR);Able to check pulse independently;Able to understand and use rate of perceived exertion (RPE) scale       Comments Reviewed home exercise with pt today from 11:20 to 11:30.  Pt plans to walk her dogs outside, and use her treadmill for exercise.  Reviewed THR, pulse, RPE, sign and symptoms, pulse oximetery and when to call 911 or MD.  Also discussed weather considerations and indoor options.  Pt voiced understanding.       Expected Outcomes Short: Implement 2 days of at home exercise to current exercise prescription. Long: Contnue exercise to improve strength and stamina.          Discharge Exercise Prescription (Final Exercise Prescription Changes):  Exercise Prescription Changes - 07/17/24 1100       Home Exercise Plan   Plans to continue exercise at Home (comment)   [name] plans to walk her dogs outside for at least 30 minutes, as well as use her treadmill   Frequency Add 2 additional days to program exercise sessions.    Initial Home Exercises Provided 07/17/24          Nutrition:  Target Goals: Understanding of nutrition guidelines, daily intake of sodium 1500mg , cholesterol 200mg , calories 30% from fat and 7% or less from saturated fats, daily to have 5 or more servings of fruits and vegetables.  Education: Nutrition 1 -Group instruction provided by verbal, written material, interactive activities, discussions, models, and posters to present general guidelines for heart healthy nutrition including macronutrients, label reading, and promoting whole foods over processed counterparts. Education serves as pensions consultant of discussion of heart healthy eating for all. Written material provided at class time.      Education: Nutrition 2 -Group instruction provided by verbal, written material, interactive activities, discussions, models, and posters to present general guidelines for heart healthy nutrition including sodium, cholesterol, and saturated fat. Providing guidance of habit forming to improve blood pressure, cholesterol, and body weight. Written material provided at class time.     Biometrics:  Pre Biometrics - 02/16/24 1131       Pre Biometrics   Height 5' 1.5 (1.562 m)    Weight 166 lb 1.6 oz (75.3 kg)    Waist Circumference 39 inches    Hip Circumference 45.5 inches    Waist to Hip Ratio 0.86 %    BMI (Calculated) 30.88    Single Leg Stand 3.5 seconds           Nutrition Therapy Plan and Nutrition Goals:   Nutrition Assessments:  MEDIFICTS Score Key: >=70 Need to make dietary changes  40-70 Heart Healthy Diet <= 40 Therapeutic Level Cholesterol Diet  Flowsheet Row Pulmonary Rehab from 02/16/2024 in [facility] Cardiac and Pulmonary Rehab  Picture Your Plate Total Score on Admission 50   Picture Your Plate Scores: <59 Unhealthy dietary pattern with much room for improvement. 41-50 Dietary pattern unlikely to meet recommendations for good health and room for improvement. 51-60 More healthful dietary pattern, with some room for improvement.  >60 Healthy dietary pattern, although there may be some specific behaviors that could be improved.   Nutrition Goals Re-Evaluation:  Nutrition Goals Re-Evaluation     Row Name 03/08/24 1119 04/26/24 1115 06/17/24 1122 07/15/24 1114       Goals   Comment Patient was informed on why it is important to maintain a balanced diet when dealing with Respiratory issues. Explained that it takes a lot of energy to breath and when they are short of breath often they will need to have a good diet to help keep up with the calories they are expending for breathing. Patient deferred RD appointment. Patient deferred RD appointment. Patient deferred  RD appointment.    Expected Outcome Short: Choose and plan snacks accordingly to patients caloric intake to improve breathing. Long: Maintain a diet independently that meets their caloric intake to aid in daily shortness of breath. -- -- --       Nutrition Goals Discharge (Final Nutrition Goals Re-Evaluation):  Nutrition Goals Re-Evaluation - 07/15/24 1114       Goals   Comment Patient deferred RD appointment.          Psychosocial: Target Goals: Acknowledge presence or absence of significant depression and/or stress, maximize coping skills, provide positive support system. Participant is able to verbalize types and ability to use techniques and skills needed for reducing stress and depression.   Education: Stress, Anxiety, and Depression - Group verbal and visual presentation to define topics covered.  Reviews how body is impacted by stress, anxiety, and depression.  Also discusses healthy ways to reduce stress and to treat/manage anxiety and depression.  Written material provided at class time.   Education: Sleep Hygiene -Provides group verbal and written instruction about how sleep can affect your health.  Define sleep hygiene, discuss sleep cycles and impact of sleep habits. Review good sleep hygiene tips.    Initial Review & Psychosocial Screening:  Initial Psych Review & Screening - 02/14/24 1348       Initial Review   Current issues with Current Sleep Concerns      Family Dynamics   Good Support System? Yes    Comments Rhoda can look to her sisters, boyfriend, niece and friends for support. She takes medication for sleep and does not take anything else for her mood.      Barriers   Psychosocial barriers to participate in program The patient should benefit from training in stress management and relaxation.      Screening Interventions   Interventions To provide support and resources with identified psychosocial needs;Encouraged to exercise;Provide feedback about the  scores to participant    Expected Outcomes Short Term goal: Utilizing psychosocial counselor, staff and physician to assist with identification of specific Stressors or current issues interfering with healing process. Setting desired goal for each stressor or current issue identified.;Long Term Goal: Stressors or current issues are controlled or eliminated.;Short Term goal: Identification and review with participant of any Quality of Life or Depression concerns found by scoring the questionnaire.;Long Term goal: The participant improves quality of Life and PHQ9 Scores as seen by post scores and/or verbalization of changes          Quality of Life Scores:  Scores  of 19 and below usually indicate a poorer quality of life in these areas.  A difference of  2-3 points is a clinically meaningful difference.  A difference of 2-3 points in the total score of the Quality of Life Index has been associated with significant improvement in overall quality of life, self-image, physical symptoms, and general health in studies assessing change in quality of life.  PHQ-9: Review Flowsheet  More data may exist      07/15/2024 06/17/2024 04/26/2024 03/08/2024 02/14/2024  Depression screen PHQ 2/9  Decreased Interest 0 0 0 0 0 0  Down, Depressed, Hopeless 0 0 0 0 0 0  PHQ - 2 Score 0 0 0 0 0 0  Altered sleeping 2 3 2 3 3 3   Tired, decreased energy 0 2 3 1 1 1   Change in appetite 3 3 3 1 1 1   Feeling bad or failure about yourself  0 0 0 0 0 0  Trouble concentrating 0 0 0 0 0 0  Moving slowly or fidgety/restless 0 0 0 0 0 0  Suicidal thoughts 0 0 0 0 0 0  PHQ-9 Score 5  8  8  5  5  5    Difficult doing work/chores Not difficult at all Somewhat difficult Not difficult at all Not difficult at all Not difficult at all Not difficult at all    Details       Data saved with a previous flowsheet row definition   Multiple values from one day are sorted in reverse-chronological order        Interpretation of Total  Score  Total Score Depression Severity:  1-4 = Minimal depression, 5-9 = Mild depression, 10-14 = Moderate depression, 15-19 = Moderately severe depression, 20-27 = Severe depression   Psychosocial Evaluation and Intervention:  Psychosocial Evaluation - 02/14/24 1349       Psychosocial Evaluation & Interventions   Interventions Relaxation education;Encouraged to exercise with the program and follow exercise prescription;Stress management education    Comments Rhoda can look to her sisters, boyfriend, niece and friends for support. She takes medication for sleep and does not take anything else for her mood.    Expected Outcomes Short: Start [facility] to help with mood. Long: Maintain a healthy mental state    Continue Psychosocial Services  Follow up required by staff          Psychosocial Re-Evaluation:  Psychosocial Re-Evaluation     Row Name 03/08/24 1120 04/26/24 1121 06/17/24 1127 07/15/24 1117       Psychosocial Re-Evaluation   Current issues with Current Sleep Concerns Current Sleep Concerns Current Sleep Concerns Current Sleep Concerns;Current Stress Concerns    Comments Reviewed patient health questionnaire (PHQ-9) with patient for follow up. Previously, patients score indicated signs/symptoms of depression.  Reviewed to see if patient is improving symptom wise while in program.  Score stayed the same and patient states that it is because she has trouble sleeping. . Reviewed patient health questionnaire (PHQ-9) with patient for follow up. Previously, patients score indicated signs/symptoms of depression.  Reviewed to see if patient is improving symptom wise while in program.  Score decreased and patient states that it is because she has trouble sleeping still and is bored. Feiga states she had alot of hobbies and wants to do those sorts of things again. Reviewed patient health questionnaire (PHQ-9) with patient for follow up. Previously, patients score indicated signs/symptoms of  depression.  Reviewed to see if patient is improving symptom wise while  in program.  Score stayed the same. She has been out prior due to COVID and vacation. Reviewed patient health questionnaire (PHQ-9) with patient for follow up. Previously, patients score indicated signs/symptoms of depression.  Reviewed to see if patient is improving symptom wise while in program.  Score improved and patient states that it is because she has been able to have more energy.    Expected Outcomes Short: Continue to attend [facility] regularly for regular exercise and social engagement. Long: Continue to improve symptoms and manage a positive mental state. Short: work towards a new hobby. Long: maintain a healthy mental state. Short: Continue to work toward an improvement in PHQ9 scores by attending [facility] regularly. Long: Continue to improve stress and depression coping skills by talking with staff and attending [facility] regularly and work toward a positive mental state. Short: Continue to attend [facility] regularly for regular exercise and social engagement. Long: Continue to improve symptoms and manage a positive mental state.    Interventions Encouraged to attend Pulmonary Rehabilitation for the exercise Encouraged to attend Pulmonary Rehabilitation for the exercise Encouraged to attend Pulmonary Rehabilitation for the exercise Encouraged to attend Pulmonary Rehabilitation for the exercise    Continue Psychosocial Services  Follow up required by staff Follow up required by staff Follow up required by staff Follow up required by staff       Psychosocial Discharge (Final Psychosocial Re-Evaluation):  Psychosocial Re-Evaluation - 07/15/24 1117       Psychosocial Re-Evaluation   Current issues with Current Sleep Concerns;Current Stress Concerns    Comments Reviewed patient health questionnaire (PHQ-9) with patient for follow up. Previously, patients score indicated signs/symptoms of depression.  Reviewed to see if  patient is improving symptom wise while in program.  Score improved and patient states that it is because she has been able to have more energy.    Expected Outcomes Short: Continue to attend [facility] regularly for regular exercise and social engagement. Long: Continue to improve symptoms and manage a positive mental state.    Interventions Encouraged to attend Pulmonary Rehabilitation for the exercise    Continue Psychosocial Services  Follow up required by staff          Education: Education Goals: Education classes will be provided on a weekly basis, covering required topics. Participant will state understanding/return demonstration of topics presented.  Learning Barriers/Preferences:  Learning Barriers/Preferences - 02/14/24 1341       Learning Barriers/Preferences   Learning Barriers Sight    Learning Preferences None          General Pulmonary Education Topics:  Infection Prevention: - Provides verbal and written material to individual with discussion of infection control including proper hand washing and proper equipment cleaning during exercise session. Flowsheet Row Pulmonary Rehab from 06/19/2024 in [facility] Cardiac and Pulmonary Rehab  Date 02/14/24  Educator [facility]  Instruction Review Code 1- Verbalizes Understanding    Falls Prevention: - Provides verbal and written material to individual with discussion of falls prevention and safety. Flowsheet Row Pulmonary Rehab from 06/19/2024 in [facility] Cardiac and Pulmonary Rehab  Date 02/14/24  Educator [facility]  Instruction Review Code 1- Verbalizes Understanding    Chronic Lung Disease Review: - Group verbal instruction with posters, models, PowerPoint presentations and videos,  to review new updates, new respiratory medications, new advancements in procedures and treatments. Providing information on websites and 800 numbers for continued self-education. Includes information about supplement oxygen, available portable oxygen systems,  continuous and intermittent flow rates, oxygen safety, concentrators, and  Medicare reimbursement for oxygen. Explanation of Pulmonary Drugs, including class, frequency, complications, importance of spacers, rinsing mouth after steroid MDI's, and proper cleaning methods for nebulizers. Review of basic lung anatomy and physiology related to function, structure, and complications of lung disease. Review of risk factors. Discussion about methods for diagnosing sleep apnea and types of masks and machines for OSA. Includes a review of the use of types of environmental controls: home humidity, furnaces, filters, dust mite/pet prevention, HEPA vacuums. Discussion about weather changes, air quality and the benefits of nasal washing. Instruction on Warning signs, infection symptoms, calling MD promptly, preventive modes, and value of vaccinations. Review of effective airway clearance, coughing and/or vibration techniques. Emphasizing that all should Create an Action Plan. Written material provided at class time. Flowsheet Row Pulmonary Rehab from 06/19/2024 in [facility] Cardiac and Pulmonary Rehab  Education need identified 02/14/24    AED/CPR: - Group verbal and written instruction with the use of models to demonstrate the basic use of the AED with the basic ABC's of resuscitation.    Tests and Procedures:  - Group verbal and visual presentation and models provide information about basic cardiac anatomy and function. Reviews the testing methods done to diagnose heart disease and the outcomes of the test results. Describes the treatment choices: Medical Management, Angioplasty, or Coronary Bypass Surgery for treating various heart conditions including Myocardial Infarction, Angina, Valve Disease, and Cardiac Arrhythmias.  Written material provided at class time.   Medication Safety: - Group verbal and visual instruction to review commonly prescribed medications for heart and lung disease. Reviews the medication,  class of the drug, and side effects. Includes the steps to properly store meds and maintain the prescription regimen.  Written material given at graduation.   Other: -Provides group and verbal instruction on various topics (see comments)   Knowledge Questionnaire Score:  Knowledge Questionnaire Score - 02/14/24 1453       Knowledge Questionnaire Score   Pre Score 14/18           Core Components/Risk Factors/Patient Goals at Admission:  Personal Goals and Risk Factors at Admission - 02/14/24 1342       Core Components/Risk Factors/Patient Goals on Admission    Weight Management Yes;Weight Loss    Intervention Weight Management: Develop a combined nutrition and exercise program designed to reach desired caloric intake, while maintaining appropriate intake of nutrient and fiber, sodium and fats, and appropriate energy expenditure required for the weight goal.;Weight Management: Provide education and appropriate resources to help participant work on and attain dietary goals.;Weight Management/Obesity: Establish reasonable short term and long term weight goals.    Expected Outcomes Short Term: Continue to assess and modify interventions until short term weight is achieved;Long Term: Adherence to nutrition and physical activity/exercise program aimed toward attainment of established weight goal;Weight Loss: Understanding of general recommendations for a balanced deficit meal plan, which promotes 1-2 lb weight loss per week and includes a negative energy balance of [id number] kcal/d;Understanding recommendations for meals to include 15-35% energy as protein, 25-35% energy from fat, 35-60% energy from carbohydrates, less than 200mg  of dietary cholesterol, 20-35 gm of total fiber daily;Understanding of distribution of calorie intake throughout the day with the consumption of 4-5 meals/snacks    Improve shortness of breath with ADL's Yes    Intervention Provide education, individualized exercise plan  and daily activity instruction to help decrease symptoms of SOB with activities of daily living.    Expected Outcomes Short Term: Improve cardiorespiratory fitness to achieve a reduction  of symptoms when performing ADLs;Long Term: Be able to perform more ADLs without symptoms or delay the onset of symptoms          Education:Diabetes - Individual verbal and written instruction to review signs/symptoms of diabetes, desired ranges of glucose level fasting, after meals and with exercise. Acknowledge that pre and post exercise glucose checks will be done for 3 sessions at entry of program.   Know Your Numbers and Heart Failure: - Group verbal and visual instruction to discuss disease risk factors for cardiac and pulmonary disease and treatment options.  Reviews associated critical values for Overweight/Obesity, Hypertension, Cholesterol, and Diabetes.  Discusses basics of heart failure: signs/symptoms and treatments.  Introduces Heart Failure Zone chart for action plan for heart failure. Written material provided at class time.   Core Components/Risk Factors/Patient Goals Review:   Goals and Risk Factor Review     Row Name 03/08/24 1118 04/26/24 1124 06/17/24 1121 07/15/24 1113       Core Components/Risk Factors/Patient Goals Review   Personal Goals Review Improve shortness of breath with ADL's Weight Management/Obesity Improve shortness of breath with ADL's Develop more efficient breathing techniques such as purse lipped breathing and diaphragmatic breathing and practicing self-pacing with activity.    Review Spoke to patient about their shortness of breath and what they can do to improve. Patient has been informed of breathing techniques when starting the program. Patient is informed to tell staff if they have had any med changes and that certain meds they are taking or not taking can be causing shortness of breath. Rhoda wants to lose some weight. She feels like she is heavy and wants to reach  a weight goal 135lbs. She planes to lose a few pounds in the next few weeks. patient is back from being on a medical hold. Spoke to patient again about their shortness of breath and what they can do to improve. Patient has been informed of breathing techniques when starting the program. Patient is informed to tell staff if they have had any med changes and that certain meds they are taking or not taking can be causing shortness of breath. Diaphragmatic and PLB breathing explained and performed with patient. Patient has a better understanding of how to do these exercises to help with breathing performance and relaxation. Patient performed breathing techniques adequately and to practice further at home.    Expected Outcomes Short: Attend [facility] regularly to improve shortness of breath with ADL's. Long: maintain independence with ADL's Short: lose a few pounds.Long: reach weight goal. Short: Attend [facility] regularly to improve shortness of breath with ADL's. Long: maintain independence with ADL's Short: practice PLB and diaphragmatic breathing at home. Long: Use PLB and diaphragmatic breathing independently post [facility].       Core Components/Risk Factors/Patient Goals at Discharge (Final Review):   Goals and Risk Factor Review - 07/15/24 1113       Core Components/Risk Factors/Patient Goals Review   Personal Goals Review Develop more efficient breathing techniques such as purse lipped breathing and diaphragmatic breathing and practicing self-pacing with activity.    Review Diaphragmatic and PLB breathing explained and performed with patient. Patient has a better understanding of how to do these exercises to help with breathing performance and relaxation. Patient performed breathing techniques adequately and to practice further at home.    Expected Outcomes Short: practice PLB and diaphragmatic breathing at home. Long: Use PLB and diaphragmatic breathing independently post [facility].  ITP Comments:  ITP Comments     Row Name 02/16/24 1107 03/13/24 1100 04/10/24 1008 05/08/24 1036 05/08/24 1042   ITP Comments First full day of exercise!  Patient was oriented to gym and equipment including functions, settings, policies, and procedures.  Patient's individual exercise prescription and treatment plan were reviewed.  All starting workloads were established based on the results of the 6 minute walk test done at initial orientation visit.  The plan for exercise progression was also introduced and progression will be customized based on patient's performance and goals. 30 Day review completed. Medical Director ITP review done, changes made as directed, and signed approval by Medical Director. 30 Day review completed. Medical Director ITP review done, changes made as directed, and signed approval by Medical Director. 30 Day review completed. Medical Director ITP review done, changes made as directed, and signed approval by Medical Director. Virtual Visit completed. Patient informed on EP and RD appointment and 6 Minute walk test. Patient also informed of patient health questionnaires on My Chart. Patient Verbalizes understanding. For visist on 02/14/2024.    Row Name 06/05/24 1116 07/03/24 1054 07/31/24 1002       ITP Comments 30 Day review completed. Medical Director ITP review done; changes made as directed and signed approval by Medical Director. New to program. 30 Day review completed. Medical Director ITP review done; changes made as directed and signed approval by Medical Director. 30 Day review completed. Medical Director ITP review done; changes made as directed and signed approval by Medical Director.        Comments: 30 day review

## 2024-08-02 ENCOUNTER — Encounter

## 2024-08-05 ENCOUNTER — Encounter

## 2024-08-05 DIAGNOSIS — R0609 Other forms of dyspnea: Secondary | ICD-10-CM | POA: Diagnosis not present

## 2024-08-05 NOTE — Progress Notes (Signed)
 Daily Session Note  Patient Details  Name: Michelle Acevedo MRN: [id number] Date of Birth: 1951-01-12 Referring Provider:   Flowsheet Row Pulmonary Rehab from 02/16/2024 in [facility] Cardiac and Pulmonary Rehab  Referring Provider Dr. Dickey Orem    Encounter Date: 08/05/2024  Check In:  Session Check In - 08/05/24 1112       Check-In   Supervising physician immediately available to respond to emergencies See telemetry face sheet for immediately available ER MD    Location [facility]-Cardiac & Pulmonary Rehab    Staff Present Burnard Davenport RN,BSN,MPA;Maxon Burnell BS, Exercise Physiologist;Joseph Rolinda RCP,RRT,BSRT;Laura Cates RN,BSN;Levie [place]by [place] BS, ACSM CEP, Exercise Physiologist    Virtual Visit No    Medication changes reported     No    Fall or balance concerns reported    No    Tobacco Cessation No Change    Warm-up and Cool-down Performed on first and last piece of equipment    Resistance Training Performed Yes    VAD Patient? No    PAD/SET Patient? No      Pain Assessment   Currently in Pain? No/denies             Social History   Tobacco Use  Smoking Status Former   Current packs/day: 1.50   Average packs/day: 1.5 packs/day for 30.0 years (45.0 ttl pk-yrs)   Types: Cigarettes  Smokeless Tobacco Never  Tobacco Comments   Quit in 2021.    Goals Met:  Proper associated with RPD/PD & O2 Sat Independence with exercise equipment Using PLB without cueing & demonstrates good technique Exercise tolerated well No report of concerns or symptoms today Strength training completed today  Goals Unmet:  Not Applicable  Comments: Pt able to follow exercise prescription today without complaint.  Will continue to monitor for progression.    Dr. Oneil Pinal is Medical Director for [facility] Cardiac Rehabilitation.  Dr. Fuad Aleskerov is Medical Director for [facility] Pulmonary Rehabilitation.

## 2024-08-07 ENCOUNTER — Encounter

## 2024-08-12 ENCOUNTER — Encounter

## 2024-08-12 DIAGNOSIS — R0609 Other forms of dyspnea: Secondary | ICD-10-CM | POA: Insufficient documentation

## 2024-08-12 NOTE — Progress Notes (Signed)
 Daily Session Note  Patient Details  Name: NITARA SZCZERBA MRN: [id number] Date of Birth: [birth year] Referring Provider:   Flowsheet Row Pulmonary Rehab from 02/16/2024 in [facility] Cardiac and Pulmonary Rehab  Referring Provider Dr. Dickey Orem    Encounter Date: 08/12/2024  Check In:  Session Check In - 08/12/24 1055       Check-In   Supervising physician immediately available to respond to emergencies See telemetry face sheet for immediately available ER MD    Location [facility]-Cardiac & Pulmonary Rehab    Staff Present Burnard Davenport RN,BSN,MPA;Joseph Rolinda RCP,RRT,BSRT;Laura Cates RN,BSN;Dorena Dorfman Dyane BS, ACSM CEP, Exercise Physiologist    Virtual Visit No    Medication changes reported     No    Fall or balance concerns reported    No    Tobacco Cessation No Change    Warm-up and Cool-down Performed on first and last piece of equipment    Resistance Training Performed Yes    VAD Patient? No    PAD/SET Patient? No      Pain Assessment   Currently in Pain? No/denies             Social History   Tobacco Use  Smoking Status Former   Current packs/day: 1.50   Average packs/day: 1.5 packs/day for 30.0 years (45.0 ttl pk-yrs)   Types: Cigarettes  Smokeless Tobacco Never  Tobacco Comments   Quit in 2021.    Goals Met:  Proper associated with RPD/PD & O2 Sat Independence with exercise equipment Using PLB without cueing & demonstrates good technique Exercise tolerated well No report of concerns or symptoms today Strength training completed today  Goals Unmet:  Not Applicable  Comments: Pt able to follow exercise prescription today without complaint.  Will continue to monitor for progression.    Dr. Oneil Pinal is Medical Director for [facility] Cardiac Rehabilitation.  Dr. Fuad Aleskerov is Medical Director for [facility] Pulmonary Rehabilitation.

## 2024-08-14 ENCOUNTER — Encounter: Admitting: Emergency Medicine

## 2024-08-14 DIAGNOSIS — R0609 Other forms of dyspnea: Secondary | ICD-10-CM

## 2024-08-14 NOTE — Progress Notes (Signed)
 Daily Session Note  Patient Details  Name: Michelle Acevedo MRN: [id number] Date of Birth: [birth year] Referring Provider:   Flowsheet Row Pulmonary Rehab from 02/16/2024 in [facility] Cardiac and Pulmonary Rehab  Referring Provider Dr. Dickey Orem    Encounter Date: 08/14/2024  Check In:  Session Check In - 08/14/24 1127       Check-In   Supervising physician immediately available to respond to emergencies See telemetry face sheet for immediately available ER MD    Location [facility]-Cardiac & Pulmonary Rehab    Staff Present Leita Franks RN,BSN;[facility] BS, Exercise Physiologist;Denise Rhew PhD, RN,CNS,CEN    Virtual Visit No    Medication changes reported     No    Fall or balance concerns reported    No    Tobacco Cessation No Change    Warm-up and Cool-down Performed on first and last piece of equipment    Resistance Training Performed Yes    VAD Patient? No    PAD/SET Patient? No      Pain Assessment   Currently in Pain? No/denies             Social History   Tobacco Use  Smoking Status Former   Current packs/day: 1.50   Average packs/day: 1.5 packs/day for 30.0 years (45.0 ttl pk-yrs)   Types: Cigarettes  Smokeless Tobacco Never  Tobacco Comments   Quit in 2021.    Goals Met:  Independence with exercise equipment Exercise tolerated well No report of concerns or symptoms today Strength training completed today  Goals Unmet:  Not Applicable  Comments: Pt able to follow exercise prescription today without complaint.  Will continue to monitor for progression.    Dr. Oneil Pinal is Medical Director for [facility] Cardiac Rehabilitation.  Dr. Fuad Aleskerov is Medical Director for [facility] Pulmonary Rehabilitation.

## 2024-08-16 ENCOUNTER — Encounter

## 2024-08-16 NOTE — Progress Notes (Signed)
 Headache as possible viral symptom

## 2024-08-19 ENCOUNTER — Encounter

## 2024-08-21 ENCOUNTER — Encounter

## 2024-08-21 DIAGNOSIS — R0609 Other forms of dyspnea: Secondary | ICD-10-CM

## 2024-08-21 NOTE — Progress Notes (Signed)
 Daily Session Note  Patient Details  Name: Michelle Acevedo MRN: [id number] Date of Birth: 04-11-1951 Referring Provider:   Flowsheet Row Pulmonary Rehab from 02/16/2024 in [facility] Cardiac and Pulmonary Rehab  Referring Provider Dr. Dickey Orem    Encounter Date: 08/21/2024  Check In:  Session Check In - 08/21/24 1115       Check-In   Supervising physician immediately available to respond to emergencies See telemetry face sheet for immediately available ER MD    Location [facility]-Cardiac & Pulmonary Rehab    Staff Present Burnard Davenport RN,BSN,MPA;Joseph [facility] RCP,RRT,BSRT;Laura Cates RN,BSN;Noah Tickle, [place], Exercise Physiologist    Virtual Visit No    Medication changes reported     No    Fall or balance concerns reported    No    Tobacco Cessation No Change    Warm-up and Cool-down Performed on first and last piece of equipment    Resistance Training Performed Yes    VAD Patient? No    PAD/SET Patient? No      Pain Assessment   Currently in Pain? No/denies             Social History   Tobacco Use  Smoking Status Former   Current packs/day: 1.50   Average packs/day: 1.5 packs/day for 30.0 years (45.0 ttl pk-yrs)   Types: Cigarettes  Smokeless Tobacco Never  Tobacco Comments   Quit in 2021.    Goals Met:  Proper associated with RPD/PD & O2 Sat Independence with exercise equipment Using PLB without cueing & demonstrates good technique Exercise tolerated well No report of concerns or symptoms today Strength training completed today  Goals Unmet:  Not Applicable  Comments: Pt able to follow exercise prescription today without complaint.  Will continue to monitor for progression.    Dr. Oneil Pinal is Medical Director for [facility] Cardiac Rehabilitation.  Dr. Fuad Aleskerov is Medical Director for [facility] Pulmonary Rehabilitation.

## 2024-08-23 ENCOUNTER — Encounter: Admitting: Emergency Medicine

## 2024-08-23 DIAGNOSIS — R0609 Other forms of dyspnea: Secondary | ICD-10-CM

## 2024-08-23 NOTE — Progress Notes (Signed)
 Daily Session Note  Patient Details  Name: Michelle Acevedo MRN: [id number] Date of Birth: 11-Feb-1951 Referring Provider:   Flowsheet Row Pulmonary Rehab from 02/16/2024 in [facility] Cardiac and Pulmonary Rehab  Referring Provider Dr. Dickey Orem    Encounter Date: 08/23/2024  Check In:  Session Check In - 08/23/24 1128       Check-In   Supervising physician immediately available to respond to emergencies See telemetry face sheet for immediately available ER MD    Location [facility]-Cardiac & Pulmonary Rehab    Staff Present Leita Franks RN,BSN;Joseph [facility] BS, Exercise Physiologist    Virtual Visit No    Medication changes reported     No    Fall or balance concerns reported    No    Tobacco Cessation No Change    Warm-up and Cool-down Performed on first and last piece of equipment    Resistance Training Performed Yes    VAD Patient? No    PAD/SET Patient? No      Pain Assessment   Currently in Pain? No/denies             Tobacco Use History[1]  Goals Met:  Proper associated with RPD/PD & O2 Sat Independence with exercise equipment Using PLB without cueing & demonstrates good technique Exercise tolerated well No report of concerns or symptoms today Strength training completed today  Goals Unmet:  Not Applicable  Comments: Pt able to follow exercise prescription today without complaint.  Will continue to monitor for progression.    Dr. Oneil Pinal is Medical Director for [facility] Cardiac Rehabilitation.  Dr. Fuad Aleskerov is Medical Director for [facility] Pulmonary Rehabilitation.    [1]  Social History Tobacco Use  Smoking Status Former   Current packs/day: 1.50   Average packs/day: 1.5 packs/day for 30.0 years (45.0 ttl pk-yrs)   Types: Cigarettes  Smokeless Tobacco Never  Tobacco Comments   Quit in 2021.

## 2024-08-26 ENCOUNTER — Encounter

## 2024-08-26 DIAGNOSIS — R0609 Other forms of dyspnea: Secondary | ICD-10-CM | POA: Diagnosis not present

## 2024-08-26 NOTE — Progress Notes (Signed)
 Daily Session Note  Patient Details  Name: Michelle Acevedo MRN: [id number] Date of Birth: 1950-12-09 Referring Provider:   Flowsheet Row Pulmonary Rehab from 02/16/2024 in [facility] Cardiac and Pulmonary Rehab  Referring Provider Dr. Dickey Orem    Encounter Date: 08/26/2024  Check In:  Session Check In - 08/26/24 1108       Check-In   Supervising physician immediately available to respond to emergencies See telemetry face sheet for immediately available ER MD    Location [facility]-Cardiac & Pulmonary Rehab    Staff Present Burnard Davenport RN,BSN,MPA;Joseph Rolinda RCP,RRT,BSRT;Laura Cates RN,BSN;Emily Massar Dyane BS, ACSM CEP, Exercise Physiologist    Virtual Visit No    Medication changes reported     No    Fall or balance concerns reported    No    Tobacco Cessation No Change    Warm-up and Cool-down Performed on first and last piece of equipment    Resistance Training Performed Yes    VAD Patient? No    PAD/SET Patient? No      Pain Assessment   Currently in Pain? No/denies             Tobacco Use History[1]  Goals Met:  Proper associated with RPD/PD & O2 Sat Independence with exercise equipment Using PLB without cueing & demonstrates good technique Exercise tolerated well No report of concerns or symptoms today Strength training completed today  Goals Unmet:  Not Applicable  Comments: Pt able to follow exercise prescription today without complaint.  Will continue to monitor for progression.    Dr. Oneil Pinal is Medical Director for [facility] Cardiac Rehabilitation.  Dr. Fuad Aleskerov is Medical Director for [facility] Pulmonary Rehabilitation.    [1]  Social History Tobacco Use  Smoking Status Former   Current packs/day: 1.50   Average packs/day: 1.5 packs/day for 30.0 years (45.0 ttl pk-yrs)   Types: Cigarettes  Smokeless Tobacco Never  Tobacco Comments   Quit in 2021.

## 2024-08-28 ENCOUNTER — Encounter

## 2024-08-28 DIAGNOSIS — R0609 Other forms of dyspnea: Secondary | ICD-10-CM

## 2024-08-28 NOTE — Progress Notes (Signed)
 Pulmonary Individual Treatment Plan  Patient Details  Name: Michelle Acevedo MRN: [id number] Date of Birth: 03-13-1951 Referring Provider:   Flowsheet Row Pulmonary Rehab from 02/16/2024 in [facility] Cardiac and Pulmonary Rehab  Referring Provider Dr. Dickey Orem    Initial Encounter Date:  Flowsheet Row Pulmonary Rehab from 02/16/2024 in [facility] Cardiac and Pulmonary Rehab  Date 02/16/24    Visit Diagnosis: DOE (dyspnea on exertion)  Patient's Home Medications on Admission: Current Medications[1]  Past Medical History: Past Medical History:  Diagnosis Date   Anemia    Arthritis    KNEE KLEFT   Cancer (HCC)    Labia   Chronic hepatitis C with cirrhosis (HCC)    Cirrhosis (HCC)    Emphysema of lung (HCC)    mild   GERD (gastroesophageal reflux disease)    Hepatitis C    History of esophageal varices    Hx of transfusion of whole blood    Macular degeneration    LEFT EYE-RECEIVES INJECTION IN LEFT EYE FOR THIS EVERY 9 WEEKS    Tobacco Use: Tobacco Use History[2]  Labs: Review Flowsheet        No data to display           Pulmonary Assessment Scores:   UCSD: Self-administered rating of dyspnea associated with activities of daily living (ADLs) 6-point scale (0 = not at all to 5 = maximal or unable to do because of breathlessness)  Scoring Scores range from 0 to 120.  Minimally important difference is 5 units  CAT: CAT can identify the health impairment of COPD patients and is better correlated with disease progression.  CAT has a scoring range of zero to 40. The CAT score is classified into four groups of low (less than 10), medium (10 - 20), high (21-30) and very high (31-40) based on the impact level of disease on health status. A CAT score over 10 suggests significant symptoms.  A worsening CAT score could be explained by an exacerbation, poor medication adherence, poor inhaler technique, or progression of COPD or comorbid conditions.  CAT MCID is 2  points  mMRC: mMRC (Modified Medical Research Council) Dyspnea Scale is used to assess the degree of baseline functional disability in patients of respiratory disease due to dyspnea. No minimal important difference is established. A decrease in score of 1 point or greater is considered a positive change.   Pulmonary Function Assessment:   Exercise Target Goals: Exercise Program Goal: Individual exercise prescription set using results from initial 6 min walk test and THRR while considering  patients activity barriers and safety.   Exercise Prescription Goal: Initial exercise prescription builds to 30-45 minutes a day of aerobic activity, 2-3 days per week.  Home exercise guidelines will be given to patient during program as part of exercise prescription that the participant will acknowledge.  Education: Aerobic Exercise: - Group verbal and visual presentation on the components of exercise prescription. Introduces F.I.T.T principle from ACSM for exercise prescriptions.  Reviews F.I.T.T. principles of aerobic exercise including progression. Written material provided at class time.   Education: Resistance Exercise: - Group verbal and visual presentation on the components of exercise prescription. Introduces F.I.T.T principle from ACSM for exercise prescriptions  Reviews F.I.T.T. principles of resistance exercise including progression. Written material provided at class time. Flowsheet Row Pulmonary Rehab from 06/19/2024 in [facility] Cardiac and Pulmonary Rehab  Date 06/19/24  Educator nt  Instruction Review Code 1- Tefl Teacher Understanding     Education: Exercise & Equipment Safety: -  Individual verbal instruction and demonstration of equipment use and safety with use of the equipment. Flowsheet Row Pulmonary Rehab from 06/19/2024 in [facility] Cardiac and Pulmonary Rehab  Date 02/14/24  Educator [facility]  Instruction Review Code 1- Verbalizes Understanding    Education: Exercise Physiology & General  Exercise Guidelines: - Group verbal and written instruction with models to review the exercise physiology of the cardiovascular system and associated critical values. Provides general exercise guidelines with specific guidelines to those with heart or lung disease.  Flowsheet Row Pulmonary Rehab from 06/19/2024 in [facility] Cardiac and Pulmonary Rehab  Education need identified 02/14/24    Education: Flexibility, Balance, Mind/Body Relaxation: - Group verbal and visual presentation with interactive activity on the components of exercise prescription. Introduces F.I.T.T principle from ACSM for exercise prescriptions. Reviews F.I.T.T. principles of flexibility and balance exercise training including progression. Also discusses the mind body connection.  Reviews various relaxation techniques to help reduce and manage stress (i.e. Deep breathing, progressive muscle relaxation, and visualization). Balance handout provided to take home. Written material provided at class time. Flowsheet Row Pulmonary Rehab from 06/19/2024 in [facility] Cardiac and Pulmonary Rehab  Date 06/19/24  Educator nt  Instruction Review Code 1- Tefl Teacher Understanding    Activity Barriers & Risk Stratification:   6 Minute Walk:  Oxygen Initial Assessment:   Oxygen Re-Evaluation:  Oxygen Re-Evaluation     Row Name 03/08/24 1116 04/24/24 1110 06/17/24 1123 07/15/24 1110 08/12/24 1119     Program Oxygen Prescription   Program Oxygen Prescription None None None None None     Home Oxygen   Home Oxygen Device None None None None None   Sleep Oxygen Prescription None None None None None   Home Exercise Oxygen Prescription None None None None None   Home Resting Oxygen Prescription None None None None None     Goals/Expected Outcomes   Short Term Goals To learn and demonstrate proper pursed lip breathing techniques or other breathing techniques.  To learn and understand importance of maintaining oxygen saturations>88%;To learn and  understand importance of monitoring SPO2 with pulse oximeter and demonstrate accurate use of the pulse oximeter. To learn and understand importance of monitoring SPO2 with pulse oximeter and demonstrate accurate use of the pulse oximeter.;To learn and understand importance of maintaining oxygen saturations>88% To learn and demonstrate proper use of respiratory medications Other   Long  Term Goals Exhibits proper breathing techniques, such as pursed lip breathing or other method taught during program session Verbalizes importance of monitoring SPO2 with pulse oximeter and return demonstration;Maintenance of O2 saturations>88% Maintenance of O2 saturations>88%;Verbalizes importance of monitoring SPO2 with pulse oximeter and return demonstration Demonstrates proper use of MDIs Other   Comments Informed patient how to perform the Pursed Lipped breathing technique. Told patient to Inhale through the nose and out the mouth with pursed lips to keep their airways open, help oxygenate them better, practice when at rest or doing strenuous activity. Patient Verbalizes understanding of technique and will work on and be reiterated during [facility]. She does not have a pulse oximeter to check her oxygen saturation at home. Informed her where to get one and explained why it is important to have one. Reviewed that oxygen saturations should be 88 percent and above. She does not have a pulse oximeter to check her oxygen saturation at home. Informed her where to get one and explained why it is important to have one. Reviewed that oxygen saturations should be 88 percent and above. Thy takes her inhalers as  needed. She takes albuterol  when she feels short of breath or wheezy. She does not have any other medications for her respiratory issues. She does not want to get a pulse oximete to check her oxygen at home. Rhoda states she goes for a CT in January to keep on eye on her lung cancer. She has a slow growing form of lung cancer.  She feels like her doctors are beating around the bush and giving her vague details.   Goals/Expected Outcomes Short: use PLB with exertion. Long: use PLB on exertion proficiently and independently. Short: monitor oxygen at home with exertion. Long: maintain oxygen saturations above 88 percent independently. Short: monitor oxygen at home with exertion. Long: maintain oxygen saturations above 88 percent independently. Short: continue medications as needed. Long: maintain medications independently. Short: continue rehab to improve lung function. Long: maintain exercise to keep lung function stable.      Oxygen Discharge (Final Oxygen Re-Evaluation):  Oxygen Re-Evaluation - 08/12/24 1119       Program Oxygen Prescription   Program Oxygen Prescription None      Home Oxygen   Home Oxygen Device None    Sleep Oxygen Prescription None    Home Exercise Oxygen Prescription None    Home Resting Oxygen Prescription None      Goals/Expected Outcomes   Short Term Goals Other    Long  Term Goals Other    Comments Rhoda states she goes for a CT in January to keep on eye on her lung cancer. She has a slow growing form of lung cancer. She feels like her doctors are beating around the bush and giving her vague details.    Goals/Expected Outcomes Short: continue rehab to improve lung function. Long: maintain exercise to keep lung function stable.          Initial Exercise Prescription:   Perform Capillary Blood Glucose checks as needed.  Exercise Prescription Changes:   Exercise Prescription Changes     Row Name 03/12/24 1300 05/09/24 1600 05/21/24 0800 07/02/24 1500 07/17/24 1100     Response to Exercise   Blood Pressure (Admit) 134/60 104/62 110/72 118/62 --   Blood Pressure (Exercise) 146/60 -- -- -- --   Blood Pressure (Exit) 118/62 110/58 108/58 122/64 --   Heart Rate (Admit) 85 bpm 78 bpm 89 bpm 81 bpm --   Heart Rate (Exercise) 108 bpm 112 bpm 112 bpm 107 bpm --   Heart Rate  (Exit) 97 bpm 80 bpm 79 bpm 98 bpm --   Oxygen Saturation (Admit) 96 % 95 % 96 % 92 % --   Oxygen Saturation (Exercise) 90 % 93 % 95 % 93 % --   Oxygen Saturation (Exit) 94 % 96 % 94 % 93 % --   Rating of Perceived Exertion (Exercise) 12 13 11 12  --   Perceived Dyspnea (Exercise) 1 1 1 1  --   Symptoms none none none none --   Duration Continue with 30 min of aerobic exercise without signs/symptoms of physical distress. Continue with 30 min of aerobic exercise without signs/symptoms of physical distress. Continue with 30 min of aerobic exercise without signs/symptoms of physical distress. Continue with 30 min of aerobic exercise without signs/symptoms of physical distress. --   Intensity THRR unchanged THRR unchanged THRR unchanged THRR unchanged --     Progression   Progression Continue to progress workloads to maintain intensity without signs/symptoms of physical distress. Continue to progress workloads to maintain intensity without signs/symptoms of physical distress. Continue to  progress workloads to maintain intensity without signs/symptoms of physical distress. Continue to progress workloads to maintain intensity without signs/symptoms of physical distress. --   Average METs 2.94 2.98 4.01 2.9 --     Resistance Training   Training Prescription Yes Yes Yes Yes --   Weight 4 lb 4 lb 4 lb 4 lb --   Reps 10-15 10-15 10-15 10-15 --     Interval Training   Interval Training No No No No --     Treadmill   MPH 2.5 2.5 2.5 2.5 --   Grade 0 0 0 0 --   Minutes 15 15 15 15  --   METs 2.91 2.91 2.91 2.91 --     Recumbant Bike   Level 3 -- -- -- --   Watts 30 -- -- -- --   Minutes 15 -- -- -- --   METs 3.03 -- -- -- --     NuStep   Level 1  T6 nustep: level 2 2 2 2  --   Minutes 15 15 15 15  --   METs --  T6 nustep: 3.3 mets 4 5.1 4 --     T5 Nustep   Level -- 1  T6 -- -- --   SPM -- 80 -- -- --   Minutes -- 15 -- -- --   METs -- 2.2 -- -- --     Biostep-RELP   Level -- -- -- 1 --    Minutes -- -- -- 15 --   METs -- -- -- 2 --     Home Exercise Plan   Plans to continue exercise at -- -- -- -- Home (comment)  [name] plans to walk her dogs outside for at least 30 minutes, as well as use her treadmill   Frequency -- -- -- -- Add 2 additional days to program exercise sessions.   Initial Home Exercises Provided -- -- -- -- 07/17/24     Oxygen   Maintain Oxygen Saturation 88% or higher 88% or higher 88% or higher 88% or higher --    Row Name 07/31/24 1500 08/13/24 1400           Response to Exercise   Blood Pressure (Admit) 120/62 102/68      Blood Pressure (Exit) 122/82 102/68      Heart Rate (Admit) 79 bpm 81 bpm      Heart Rate (Exercise) 109 bpm 121 bpm      Heart Rate (Exit) 80 bpm 90 bpm      Oxygen Saturation (Admit) 98 % 96 %      Oxygen Saturation (Exercise) 95 % 88 %      Oxygen Saturation (Exit) 97 % 92 %      Rating of Perceived Exertion (Exercise) 11 11      Perceived Dyspnea (Exercise) 0 --      Symptoms none hip/knee pain      Duration Continue with 30 min of aerobic exercise without signs/symptoms of physical distress. Continue with 30 min of aerobic exercise without signs/symptoms of physical distress.      Intensity THRR unchanged THRR unchanged        Progression   Progression Continue to progress workloads to maintain intensity without signs/symptoms of physical distress. Continue to progress workloads to maintain intensity without signs/symptoms of physical distress.      Average METs 3.21 2.88        Resistance Training   Training Prescription Yes Yes  Weight 4 lb 4 lb      Reps 10-15 10-15        Interval Training   Interval Training No No        Treadmill   MPH 2.4 2.3      Grade 0 0      Minutes 15 15      METs 2.84 2.76        NuStep   Level 2 1  T6: level 2      Minutes 15 15      METs 4.8 3.6  T6: 2.4 METs        Home Exercise Plan   Plans to continue exercise at Home (comment)  [name] plans to walk her dogs  outside for at least 30 minutes, as well as use her treadmill Home (comment)  [name] plans to walk her dogs outside for at least 30 minutes, as well as use her treadmill      Frequency Add 2 additional days to program exercise sessions. Add 2 additional days to program exercise sessions.      Initial Home Exercises Provided 07/17/24 07/17/24        Oxygen   Maintain Oxygen Saturation 88% or higher 88% or higher         Exercise Comments:   Exercise Goals and Review:   Exercise Goals Re-Evaluation :  Exercise Goals Re-Evaluation     Row Name 03/12/24 1359 04/25/24 1404 05/09/24 1640 05/21/24 0835 06/04/24 1514     Exercise Goal Re-Evaluation   Exercise Goals Review Increase Strength and Stamina;Increase Physical Activity;Understanding of Exercise Prescription Increase Strength and Stamina;Increase Physical Activity;Understanding of Exercise Prescription Increase Strength and Stamina;Increase Physical Activity;Understanding of Exercise Prescription Increase Strength and Stamina;Increase Physical Activity;Understanding of Exercise Prescription Increase Strength and Stamina;Increase Physical Activity;Understanding of Exercise Prescription   Comments Rhoda is doing well in rehab. She increased her workload on the treadmill to a speed of 2.5 mph with no incline. She also improved to level 3 on the recumbent bike and increased to 4 lb handweights for resistance training. We will continue to monitor her progress in the program. Rhoda has not attended rehab since the last review due to personal reasons. We will continue to monitor her progress when she returns to the program. Rhoda is doing well in rehab. She returned to rehab in this review. She maintained a speed of 2.5 mph on the treadmill with no incline. She increased to level 2 on the T4 nustep. She maintained level 1 on the T6 nustep. We will continue to monitor her progress in the program. Rhoda has only attended one session since the last  review. During her one session she continued to work at level 2 on the T4 nustep. She also has maintained her treadmill workload at a speed of 2.5 mph with no incline. We will continue to monitor her progress in the program. Rhoda has not attended rehab since the last review. She last attended rehab on 05/06/2024. We will reach out to her to see when she plans to return. We will continue to monitor her progress when she returns to the program.   Expected Outcomes Short: Continue to progressively increase treadmill workload. Long: Continue exercise to improve strength and stamina. Short: Return to rehab when appropriate. Long: Graduate. Short: Continue to progressively increase treadmill workload. Long: Continue exercise to improve strength and stamina. Short: Attend rehab more consistently. Long: Continue exercise to improve strength and stamina. Short: Return to rehab when appropriate.  Long: Graduate.    Row Name 06/19/24 1545 07/02/24 1508 07/16/24 1515 07/17/24 1131 07/31/24 1545     Exercise Goal Re-Evaluation   Exercise Goals Review Increase Strength and Stamina;Increase Physical Activity;Understanding of Exercise Prescription Increase Strength and Stamina;Increase Physical Activity;Understanding of Exercise Prescription Increase Strength and Stamina;Increase Physical Activity;Understanding of Exercise Prescription Increase Physical Activity;Able to understand and use Dyspnea scale;Understanding of Exercise Prescription;Increase Strength and Stamina;Knowledge and understanding of Target Heart Rate Range (THRR);Able to check pulse independently;Able to understand and use rate of perceived exertion (RPE) scale Increase Physical Activity;Increase Strength and Stamina;Understanding of Exercise Prescription   Comments Rhoda did not attend rehab during the last review period. She did return to rehab on 06/17/2024 and did well with exercise. We will continue to monitor her progress in the program. Rhoda only  attended rehab twice during the last review period. She continues to do well walking at 2.5 mph with no incline on the treadmill. She also continues to do well on the T4 nustep and biostep. We will continue to monitor her progress in the program. Rhoda did not attend rehab during the last review period. She has since returned to her regular attendance in the program. We will continue to monitor her progress in the program. Reviewed home exercise with pt today from 11:20 to 11:30.  Pt plans to walk her dogs outside, and use her treadmill for exercise.  Reviewed THR, pulse, RPE, sign and symptoms, pulse oximetery and when to call 911 or MD.  Also discussed weather considerations and indoor options.  Pt voiced understanding. Rhoda continues to do well in the program. She has been able to maintain her treadmill workload at a speed of 2. and no incline, as well as the T4 nustep at level 2. We will continue to monitor her progress in the program.   Expected Outcomes Short: Continue to attend rehab consistently. Long: Continue exercise to improve strength and stamina. Short: Attend rehab more consistently. Long: Continue exercise to improve strength and stamina. Short: Attend rehab more consistently. Long: Continue exercise to improve strength and stamina. Short: Implement 2 days of at home exercise to current exercise prescription. Long: Contnue exercise to improve strength and stamina. Short: Increase treadmill workload to a speed of 2.84mph. Long: Continue exercise to improve strength and stamina.    Row Name 08/13/24 1427             Exercise Goal Re-Evaluation   Exercise Goals Review Increase Physical Activity;Increase Strength and Stamina;Understanding of Exercise Prescription       Comments Rhoda only attended two sessions in rehab since the last review. She continued to walk on the treadmill at a decreased speed of 2.3 mph and no incline. She has been restricted with exercise lately due to hip and  knee pain. We will continue to monitor her progress in the program.       Expected Outcomes Short: Attend rehab more consistently. Long: Continue exercise to improve strength and stamina.          Discharge Exercise Prescription (Final Exercise Prescription Changes):  Exercise Prescription Changes - 08/13/24 1400       Response to Exercise   Blood Pressure (Admit) 102/68    Blood Pressure (Exit) 102/68    Heart Rate (Admit) 81 bpm    Heart Rate (Exercise) 121 bpm    Heart Rate (Exit) 90 bpm    Oxygen Saturation (Admit) 96 %    Oxygen Saturation (Exercise) 88 %    Oxygen Saturation (  Exit) 92 %    Rating of Perceived Exertion (Exercise) 11    Symptoms hip/knee pain    Duration Continue with 30 min of aerobic exercise without signs/symptoms of physical distress.    Intensity THRR unchanged      Progression   Progression Continue to progress workloads to maintain intensity without signs/symptoms of physical distress.    Average METs 2.88      Resistance Training   Training Prescription Yes    Weight 4 lb    Reps 10-15      Interval Training   Interval Training No      Treadmill   MPH 2.3    Grade 0    Minutes 15    METs 2.76      NuStep   Level 1   T6: level 2   Minutes 15    METs 3.6   T6: 2.4 METs     Home Exercise Plan   Plans to continue exercise at Home (comment)   [name] plans to walk her dogs outside for at least 30 minutes, as well as use her treadmill   Frequency Add 2 additional days to program exercise sessions.    Initial Home Exercises Provided 07/17/24      Oxygen   Maintain Oxygen Saturation 88% or higher          Nutrition:  Target Goals: Understanding of nutrition guidelines, daily intake of sodium 1500mg , cholesterol 200mg , calories 30% from fat and 7% or less from saturated fats, daily to have 5 or more servings of fruits and vegetables.  Education: Nutrition 1 -Group instruction provided by verbal, written material, interactive  activities, discussions, models, and posters to present general guidelines for heart healthy nutrition including macronutrients, label reading, and promoting whole foods over processed counterparts. Education serves as pensions consultant of discussion of heart healthy eating for all. Written material provided at class time.     Education: Nutrition 2 -Group instruction provided by verbal, written material, interactive activities, discussions, models, and posters to present general guidelines for heart healthy nutrition including sodium, cholesterol, and saturated fat. Providing guidance of habit forming to improve blood pressure, cholesterol, and body weight. Written material provided at class time.     Biometrics:    Nutrition Therapy Plan and Nutrition Goals:   Nutrition Assessments:  MEDIFICTS Score Key: >=70 Need to make dietary changes  40-70 Heart Healthy Diet <= 40 Therapeutic Level Cholesterol Diet  Flowsheet Row Pulmonary Rehab from 02/16/2024 in [facility] Cardiac and Pulmonary Rehab  Picture Your Plate Total Score on Admission 50   Picture Your Plate Scores: <59 Unhealthy dietary pattern with much room for improvement. 41-50 Dietary pattern unlikely to meet recommendations for good health and room for improvement. 51-60 More healthful dietary pattern, with some room for improvement.  >60 Healthy dietary pattern, although there may be some specific behaviors that could be improved.   Nutrition Goals Re-Evaluation:  Nutrition Goals Re-Evaluation     Row Name 03/08/24 1119 04/26/24 1115 06/17/24 1122 07/15/24 1114 08/12/24 1115     Goals   Comment Patient was informed on why it is important to maintain a balanced diet when dealing with Respiratory issues. Explained that it takes a lot of energy to breath and when they are short of breath often they will need to have a good diet to help keep up with the calories they are expending for breathing. Patient deferred RD appointment. Patient  deferred RD appointment. Patient deferred RD appointment. Patient deferred RD  appointment.   Expected Outcome Short: Choose and plan snacks accordingly to patients caloric intake to improve breathing. Long: Maintain a diet independently that meets their caloric intake to aid in daily shortness of breath. -- -- -- --      Nutrition Goals Discharge (Final Nutrition Goals Re-Evaluation):  Nutrition Goals Re-Evaluation - 08/12/24 1115       Goals   Comment Patient deferred RD appointment.          Psychosocial: Target Goals: Acknowledge presence or absence of significant depression and/or stress, maximize coping skills, provide positive support system. Participant is able to verbalize types and ability to use techniques and skills needed for reducing stress and depression.   Education: Stress, Anxiety, and Depression - Group verbal and visual presentation to define topics covered.  Reviews how body is impacted by stress, anxiety, and depression.  Also discusses healthy ways to reduce stress and to treat/manage anxiety and depression.  Written material provided at class time.   Education: Sleep Hygiene -Provides group verbal and written instruction about how sleep can affect your health.  Define sleep hygiene, discuss sleep cycles and impact of sleep habits. Review good sleep hygiene tips.    Initial Review & Psychosocial Screening:   Quality of Life Scores:  Scores of 19 and below usually indicate a poorer quality of life in these areas.  A difference of  2-3 points is a clinically meaningful difference.  A difference of 2-3 points in the total score of the Quality of Life Index has been associated with significant improvement in overall quality of life, self-image, physical symptoms, and general health in studies assessing change in quality of life.  PHQ-9: Review Flowsheet  More data exists      08/12/2024 07/15/2024 06/17/2024 04/26/2024 03/08/2024  Depression screen PHQ 2/9   Decreased Interest 0 0 0 0 0  Down, Depressed, Hopeless 0 0 0 0 0  PHQ - 2 Score 0 0 0 0 0  Altered sleeping 3 2 3 2 3   Tired, decreased energy 1 0 2 3 1   Change in appetite 1 3 3 3 1   Feeling bad or failure about yourself  0 0 0 0 0  Trouble concentrating 0 0 0 0 0  Moving slowly or fidgety/restless 0 0 0 0 0  Suicidal thoughts 0 0 0 0 0  PHQ-9 Score 5 5  8  8  5    Difficult doing work/chores Not difficult at all Not difficult at all Somewhat difficult Not difficult at all Not difficult at all    Details       Data saved with a previous flowsheet row definition        Interpretation of Total Score  Total Score Depression Severity:  1-4 = Minimal depression, 5-9 = Mild depression, 10-14 = Moderate depression, 15-19 = Moderately severe depression, 20-27 = Severe depression   Psychosocial Evaluation and Intervention:   Psychosocial Re-Evaluation:  Psychosocial Re-Evaluation     Row Name 03/08/24 1120 04/26/24 1121 06/17/24 1127 07/15/24 1117 08/12/24 1117     Psychosocial Re-Evaluation   Current issues with Current Sleep Concerns Current Sleep Concerns Current Sleep Concerns Current Sleep Concerns;Current Stress Concerns Current Sleep Concerns;Current Stress Concerns   Comments Reviewed patient health questionnaire (PHQ-9) with patient for follow up. Previously, patients score indicated signs/symptoms of depression.  Reviewed to see if patient is improving symptom wise while in program.  Score stayed the same and patient states that it is because she has trouble sleeping. SABRA  Reviewed patient health questionnaire (PHQ-9) with patient for follow up. Previously, patients score indicated signs/symptoms of depression.  Reviewed to see if patient is improving symptom wise while in program.  Score decreased and patient states that it is because she has trouble sleeping still and is bored. Shamia states she had alot of hobbies and wants to do those sorts of things again. Reviewed patient  health questionnaire (PHQ-9) with patient for follow up. Previously, patients score indicated signs/symptoms of depression.  Reviewed to see if patient is improving symptom wise while in program.  Score stayed the same. She has been out prior due to COVID and vacation. Reviewed patient health questionnaire (PHQ-9) with patient for follow up. Previously, patients score indicated signs/symptoms of depression.  Reviewed to see if patient is improving symptom wise while in program.  Score improved and patient states that it is because she has been able to have more energy. Reviewed patient health questionnaire (PHQ-9) with patient for follow up. Previously, patients score indicated signs/symptoms of depression.  Reviewed to see if patient is improving symptom wise while in program.  Score stayed the same. She has been praying more and has a postive outlook on life.   Expected Outcomes Short: Continue to attend [facility] regularly for regular exercise and social engagement. Long: Continue to improve symptoms and manage a positive mental state. Short: work towards a new hobby. Long: maintain a healthy mental state. Short: Continue to work toward an improvement in PHQ9 scores by attending [facility] regularly. Long: Continue to improve stress and depression coping skills by talking with staff and attending [facility] regularly and work toward a positive mental state. Short: Continue to attend [facility] regularly for regular exercise and social engagement. Long: Continue to improve symptoms and manage a positive mental state. Short: Continue to attend [facility] regularly for regular exercise and social engagement. Long: Continue to improve symptoms and manage a positive mental state.   Interventions Encouraged to attend Pulmonary Rehabilitation for the exercise Encouraged to attend Pulmonary Rehabilitation for the exercise Encouraged to attend Pulmonary Rehabilitation for the exercise Encouraged to attend Pulmonary  Rehabilitation for the exercise Encouraged to attend Pulmonary Rehabilitation for the exercise   Continue Psychosocial Services  Follow up required by staff Follow up required by staff Follow up required by staff Follow up required by staff Follow up required by staff      Psychosocial Discharge (Final Psychosocial Re-Evaluation):  Psychosocial Re-Evaluation - 08/12/24 1117       Psychosocial Re-Evaluation   Current issues with Current Sleep Concerns;Current Stress Concerns    Comments Reviewed patient health questionnaire (PHQ-9) with patient for follow up. Previously, patients score indicated signs/symptoms of depression.  Reviewed to see if patient is improving symptom wise while in program.  Score stayed the same. She has been praying more and has a postive outlook on life.    Expected Outcomes Short: Continue to attend [facility] regularly for regular exercise and social engagement. Long: Continue to improve symptoms and manage a positive mental state.    Interventions Encouraged to attend Pulmonary Rehabilitation for the exercise    Continue Psychosocial Services  Follow up required by staff          Education: Education Goals: Education classes will be provided on a weekly basis, covering required topics. Participant will state understanding/return demonstration of topics presented.  Learning Barriers/Preferences:   General Pulmonary Education Topics:  Infection Prevention: - Provides verbal and written material to individual with discussion of infection control including proper hand  washing and proper equipment cleaning during exercise session. Flowsheet Row Pulmonary Rehab from 06/19/2024 in [facility] Cardiac and Pulmonary Rehab  Date 02/14/24  Educator [facility]  Instruction Review Code 1- Verbalizes Understanding    Falls Prevention: - Provides verbal and written material to individual with discussion of falls prevention and safety. Flowsheet Row Pulmonary Rehab from 06/19/2024 in  [facility] Cardiac and Pulmonary Rehab  Date 02/14/24  Educator [facility]  Instruction Review Code 1- Verbalizes Understanding    Chronic Lung Disease Review: - Group verbal instruction with posters, models, PowerPoint presentations and videos,  to review new updates, new respiratory medications, new advancements in procedures and treatments. Providing information on websites and 800 numbers for continued self-education. Includes information about supplement oxygen, available portable oxygen systems, continuous and intermittent flow rates, oxygen safety, concentrators, and Medicare reimbursement for oxygen. Explanation of Pulmonary Drugs, including class, frequency, complications, importance of spacers, rinsing mouth after steroid MDI's, and proper cleaning methods for nebulizers. Review of basic lung anatomy and physiology related to function, structure, and complications of lung disease. Review of risk factors. Discussion about methods for diagnosing sleep apnea and types of masks and machines for OSA. Includes a review of the use of types of environmental controls: home humidity, furnaces, filters, dust mite/pet prevention, HEPA vacuums. Discussion about weather changes, air quality and the benefits of nasal washing. Instruction on Warning signs, infection symptoms, calling MD promptly, preventive modes, and value of vaccinations. Review of effective airway clearance, coughing and/or vibration techniques. Emphasizing that all should Create an Action Plan. Written material provided at class time. Flowsheet Row Pulmonary Rehab from 06/19/2024 in [facility] Cardiac and Pulmonary Rehab  Education need identified 02/14/24    AED/CPR: - Group verbal and written instruction with the use of models to demonstrate the basic use of the AED with the basic ABC's of resuscitation.    Tests and Procedures:  - Group verbal and visual presentation and models provide information about basic cardiac anatomy and function. Reviews  the testing methods done to diagnose heart disease and the outcomes of the test results. Describes the treatment choices: Medical Management, Angioplasty, or Coronary Bypass Surgery for treating various heart conditions including Myocardial Infarction, Angina, Valve Disease, and Cardiac Arrhythmias.  Written material provided at class time.   Medication Safety: - Group verbal and visual instruction to review commonly prescribed medications for heart and lung disease. Reviews the medication, class of the drug, and side effects. Includes the steps to properly store meds and maintain the prescription regimen.  Written material given at graduation.   Other: -Provides group and verbal instruction on various topics (see comments)   Knowledge Questionnaire Score:    Core Components/Risk Factors/Patient Goals at Admission:   Education:Diabetes - Individual verbal and written instruction to review signs/symptoms of diabetes, desired ranges of glucose level fasting, after meals and with exercise. Acknowledge that pre and post exercise glucose checks will be done for 3 sessions at entry of program.   Know Your Numbers and Heart Failure: - Group verbal and visual instruction to discuss disease risk factors for cardiac and pulmonary disease and treatment options.  Reviews associated critical values for Overweight/Obesity, Hypertension, Cholesterol, and Diabetes.  Discusses basics of heart failure: signs/symptoms and treatments.  Introduces Heart Failure Zone chart for action plan for heart failure. Written material provided at class time.   Core Components/Risk Factors/Patient Goals Review:   Goals and Risk Factor Review     Row Name 03/08/24 1118 04/26/24 1124 06/17/24 1121 07/15/24 1113 08/12/24  1118     Core Components/Risk Factors/Patient Goals Review   Personal Goals Review Improve shortness of breath with ADL's Weight Management/Obesity Improve shortness of breath with ADL's Develop more  efficient breathing techniques such as purse lipped breathing and diaphragmatic breathing and practicing self-pacing with activity. Develop more efficient breathing techniques such as purse lipped breathing and diaphragmatic breathing and practicing self-pacing with activity.   Review Spoke to patient about their shortness of breath and what they can do to improve. Patient has been informed of breathing techniques when starting the program. Patient is informed to tell staff if they have had any med changes and that certain meds they are taking or not taking can be causing shortness of breath. Rhoda wants to lose some weight. She feels like she is heavy and wants to reach a weight goal 135lbs. She planes to lose a few pounds in the next few weeks. patient is back from being on a medical hold. Spoke to patient again about their shortness of breath and what they can do to improve. Patient has been informed of breathing techniques when starting the program. Patient is informed to tell staff if they have had any med changes and that certain meds they are taking or not taking can be causing shortness of breath. Diaphragmatic and PLB breathing explained and performed with patient. Patient has a better understanding of how to do these exercises to help with breathing performance and relaxation. Patient performed breathing techniques adequately and to practice further at home. Rhoda states that she is not doing breathing techniques. She states that she just hasnt done them. Informed her that is is important to do especially when she feels short of breath. Patient will try to work on PLB techniques.   Expected Outcomes Short: Attend [facility] regularly to improve shortness of breath with ADL's. Long: maintain independence with ADL's Short: lose a few pounds.Long: reach weight goal. Short: Attend [facility] regularly to improve shortness of breath with ADL's. Long: maintain independence with ADL's Short: practice PLB and  diaphragmatic breathing at home. Long: Use PLB and diaphragmatic breathing independently post [facility]. Short: practice PLB and diaphragmatic breathing at home. Long: Use PLB and diaphragmatic breathing independently post [facility].      Core Components/Risk Factors/Patient Goals at Discharge (Final Review):   Goals and Risk Factor Review - 08/12/24 1118       Core Components/Risk Factors/Patient Goals Review   Personal Goals Review Develop more efficient breathing techniques such as purse lipped breathing and diaphragmatic breathing and practicing self-pacing with activity.    Review Rhoda states that she is not doing breathing techniques. She states that she just hasnt done them. Informed her that is is important to do especially when she feels short of breath. Patient will try to work on PLB techniques.    Expected Outcomes Short: practice PLB and diaphragmatic breathing at home. Long: Use PLB and diaphragmatic breathing independently post [facility].          ITP Comments:  ITP Comments     Row Name 03/13/24 1100 04/10/24 1008 05/08/24 1036 05/08/24 1042 06/05/24 1116   ITP Comments 30 Day review completed. Medical Director ITP review done, changes made as directed, and signed approval by Medical Director. 30 Day review completed. Medical Director ITP review done, changes made as directed, and signed approval by Medical Director. 30 Day review completed. Medical Director ITP review done, changes made as directed, and signed approval by Medical Director. Virtual Visit completed. Patient informed on EP and  RD appointment and 6 Minute walk test. Patient also informed of patient health questionnaires on My Chart. Patient Verbalizes understanding. For visist on 02/14/2024. 30 Day review completed. Medical Director ITP review done; changes made as directed and signed approval by Medical Director. New to program.    Row Name 07/03/24 1054 07/31/24 1002 08/28/24 0947       ITP Comments 30 Day  review completed. Medical Director ITP review done; changes made as directed and signed approval by Medical Director. 30 Day review completed. Medical Director ITP review done; changes made as directed and signed approval by Medical Director. 30 Day review completed. Medical Director ITP review done, changes made as directed, and signed approval by Medical Director.        Comments: 30 day review      [1]  Current Outpatient Medications:    acetaminophen  (TYLENOL ) 325 MG tablet, Take 325 mg by mouth., Disp: , Rfl:    acetaminophen  (TYLENOL ) 500 MG tablet, Take 500 mg by mouth every 6 (six) hours as needed for moderate pain or headache., Disp: , Rfl:    albuterol  (VENTOLIN  HFA) 108 (90 Base) MCG/ACT inhaler, Inhale 1-2 puffs into the lungs every 6 (six) hours as needed for wheezing or shortness of breath. (Patient not taking: Reported on 02/14/2024), Disp: 18 g, Rfl: 0   albuterol  (VENTOLIN  HFA) 108 (90 Base) MCG/ACT inhaler, Inhale 2 puffs into the lungs., Disp: , Rfl:    Albuterol  Sulfate (PROAIR  RESPICLICK) 108 (90 Base) MCG/ACT AEPB, Inhale 2 puffs into the lungs., Disp: , Rfl:    fluticasone  (FLONASE ) 50 MCG/ACT nasal spray, Place 1 spray into both nostrils daily for 7 days., Disp: 1 g, Rfl: 0   furosemide (LASIX) 40 MG tablet, Take 40 mg by mouth daily.  (Patient not taking: Reported on 02/14/2024), Disp: , Rfl:    Ipratropium-Albuterol  (COMBIVENT) 20-100 MCG/ACT AERS respimat, Inhale 2 puffs into the lungs 3 (three) times daily as needed for shortness of breath., Disp: , Rfl:    lactulose (CHRONULAC) 10 GM/15ML solution, Take 10 g by mouth 3 (three) times daily as needed (for elevated ammonia level)., Disp: , Rfl:    NON FORMULARY, B3, Disp: , Rfl:    oxyCODONE  (ROXICODONE ) 5 MG immediate release tablet, Take 1 tablet (5 mg total) by mouth every 8 (eight) hours as needed for up to 16 doses for breakthrough pain or severe pain. (Patient not taking: Reported on 02/14/2024), Disp: 16 tablet, Rfl:  0   pantoprazole (PROTONIX) 40 MG tablet, Take 40 mg by mouth every morning.  (Patient not taking: Reported on 02/14/2024), Disp: , Rfl:    pantoprazole (PROTONIX) 40 MG tablet, Take 40 mg by mouth., Disp: , Rfl:    Potassium 99 MG TABS, Take 99 mg by mouth daily., Disp: , Rfl:    rifaximin (XIFAXAN) 550 MG TABS tablet, Take 550 mg by mouth every morning.  (Patient not taking: Reported on 02/14/2024), Disp: , Rfl:    spironolactone (ALDACTONE) 100 MG tablet, Take 100 mg by mouth daily. , Disp: , Rfl:    tacrolimus (PROGRAF) 5 MG capsule, Take 25 mg by mouth 2 (two) times daily., Disp: , Rfl:    traZODone (DESYREL) 50 MG tablet, Take 50 mg by mouth at bedtime., Disp: , Rfl:  [2]  Social History Tobacco Use  Smoking Status Former   Current packs/day: 1.50   Average packs/day: 1.5 packs/day for 30.0 years (45.0 ttl pk-yrs)   Types: Cigarettes  Smokeless Tobacco Never  Tobacco  Comments   Quit in 2021.

## 2024-08-30 ENCOUNTER — Encounter

## 2024-08-30 DIAGNOSIS — R0609 Other forms of dyspnea: Secondary | ICD-10-CM | POA: Diagnosis not present

## 2024-08-30 NOTE — Progress Notes (Signed)
 Daily Session Note  Patient Details  Name: Michelle Acevedo MRN: [id number] Date of Birth: [birth year] Referring Provider:   Flowsheet Row Pulmonary Rehab from 02/16/2024 in [facility] Cardiac and Pulmonary Rehab  Referring Provider Dr. Dickey Orem    Encounter Date: 08/30/2024  Check In:  Session Check In - 08/30/24 1209       Check-In   Supervising physician immediately available to respond to emergencies See telemetry face sheet for immediately available ER MD    Location [facility]-Cardiac & Pulmonary Rehab    Staff Present Maxon Burnell HECKLE, Exercise Physiologist;Joseph Hood RCP,RRT,BSRT;Meredith Tressa RN,BSN;Nesta Kimple, RN, DNP, NE-BC    Virtual Visit No    Medication changes reported     No    Fall or balance concerns reported    No    Warm-up and Cool-down Performed on first and last piece of equipment    Resistance Training Performed Yes    VAD Patient? No    PAD/SET Patient? No      Pain Assessment   Currently in Pain? No/denies             Tobacco Use History[1]  Goals Met:  Proper associated with RPD/PD & O2 Sat Independence with exercise equipment Using PLB without cueing & demonstrates good technique Exercise tolerated well No report of concerns or symptoms today Strength training completed today  Goals Unmet:  Not Applicable  Comments: Pt able to follow exercise prescription today without complaint.  Will continue to monitor for progression.    Dr. Oneil Pinal is Medical Director for [facility] Cardiac Rehabilitation.  Dr. Fuad Aleskerov is Medical Director for [facility] Pulmonary Rehabilitation.    [1]  Social History Tobacco Use  Smoking Status Former   Current packs/day: 1.50   Average packs/day: 1.5 packs/day for 30.0 years (45.0 ttl pk-yrs)   Types: Cigarettes  Smokeless Tobacco Never  Tobacco Comments   Quit in 2021.

## 2024-09-02 ENCOUNTER — Encounter

## 2024-09-04 ENCOUNTER — Encounter

## 2024-09-09 ENCOUNTER — Encounter

## 2024-09-09 DIAGNOSIS — R0609 Other forms of dyspnea: Secondary | ICD-10-CM | POA: Diagnosis not present

## 2024-09-09 NOTE — Progress Notes (Signed)
 Daily Session Note  Patient Details  Name: Michelle Acevedo MRN: [id number] Date of Birth: [birth year] Referring Provider:   Flowsheet Row Pulmonary Rehab from 02/16/2024 in [facility] Cardiac and Pulmonary Rehab  Referring Provider Dr. Dickey Orem    Encounter Date: 09/09/2024  Check In:  Session Check In - 09/09/24 1114       Check-In   Supervising physician immediately available to respond to emergencies See telemetry face sheet for immediately available ER MD    Location [facility]-Cardiac & Pulmonary Rehab    Staff Present Burnard Davenport RN,BSN,MPA;Maxon Conetta BS, Exercise Physiologist;Joseph Rolinda NORWOOD HARMAN Cecilie Delores, BS, RRT, CPFT    Virtual Visit No    Medication changes reported     No    Fall or balance concerns reported    No    Tobacco Cessation No Change    Warm-up and Cool-down Performed on first and last piece of equipment    Resistance Training Performed Yes    VAD Patient? No    PAD/SET Patient? No      Pain Assessment   Currently in Pain? No/denies             Tobacco Use History[1]  Goals Met:  Proper associated with RPD/PD & O2 Sat Independence with exercise equipment Using PLB without cueing & demonstrates good technique Exercise tolerated well No report of concerns or symptoms today Strength training completed today  Goals Unmet:  Not Applicable  Comments: Pt able to follow exercise prescription today without complaint.  Will continue to monitor for progression.    Dr. Oneil Pinal is Medical Director for [facility] Cardiac Rehabilitation.  Dr. Fuad Aleskerov is Medical Director for [facility] Pulmonary Rehabilitation.    [1]  Social History Tobacco Use  Smoking Status Former   Current packs/day: 1.50   Average packs/day: 1.5 packs/day for 30.0 years (45.0 ttl pk-yrs)   Types: Cigarettes  Smokeless Tobacco Never  Tobacco Comments   Quit in 2021.

## 2024-09-11 ENCOUNTER — Encounter

## 2024-09-13 ENCOUNTER — Encounter: Attending: Thoracic Surgery (Cardiothoracic Vascular Surgery)

## 2024-09-13 DIAGNOSIS — R0609 Other forms of dyspnea: Secondary | ICD-10-CM | POA: Insufficient documentation

## 2024-09-16 ENCOUNTER — Encounter: Admitting: *Deleted

## 2024-09-16 DIAGNOSIS — R0609 Other forms of dyspnea: Secondary | ICD-10-CM | POA: Diagnosis present

## 2024-09-16 NOTE — Progress Notes (Signed)
 Daily Session Note  Patient Details  Name: Michelle Acevedo MRN: [id number] Date of Birth: [birth year] Referring Provider:   Flowsheet Row Pulmonary Rehab from 02/16/2024 in [facility] Cardiac and Pulmonary Rehab  Referring Provider Dr. Dickey Orem    Encounter Date: 09/16/2024  Check In:  Session Check In - 09/16/24 1123       Check-In   Supervising physician immediately available to respond to emergencies See telemetry face sheet for immediately available ER MD    Location [facility]-Cardiac & Pulmonary Rehab    Staff Present Othel Durand, RN, BSN, CCRP;Laura Cates RN,BSN;Joseph Sanmina-sci BS, ACSM CEP, Exercise Physiologist    Virtual Visit No    Medication changes reported     No    Fall or balance concerns reported    No    Warm-up and Cool-down Performed on first and last piece of equipment    Resistance Training Performed Yes    VAD Patient? No    PAD/SET Patient? No      Pain Assessment   Currently in Pain? No/denies             Tobacco Use History[1]  Goals Met:  Proper associated with RPD/PD & O2 Sat Independence with exercise equipment Exercise tolerated well No report of concerns or symptoms today  Goals Unmet:  Not Applicable  Comments: Pt able to follow exercise prescription today without complaint.  Will continue to monitor for progression.    Dr. Oneil Pinal is Medical Director for [facility] Cardiac Rehabilitation.  Dr. Fuad Aleskerov is Medical Director for [facility] Pulmonary Rehabilitation.    [1]  Social History Tobacco Use  Smoking Status Former   Current packs/day: 1.50   Average packs/day: 1.5 packs/day for 30.0 years (45.0 ttl pk-yrs)   Types: Cigarettes  Smokeless Tobacco Never  Tobacco Comments   Quit in 2021.

## 2024-09-18 ENCOUNTER — Encounter: Admitting: Emergency Medicine

## 2024-09-18 DIAGNOSIS — R0609 Other forms of dyspnea: Secondary | ICD-10-CM

## 2024-09-18 NOTE — Progress Notes (Signed)
 Daily Session Note  Patient Details  Name: Michelle Acevedo MRN: [id number] Date of Birth: 06-17-1951 Referring Provider:   Flowsheet Row Pulmonary Rehab from 02/16/2024 in [facility] Cardiac and Pulmonary Rehab  Referring Provider Dr. Dickey Orem    Encounter Date: 09/18/2024  Check In:  Session Check In - 09/18/24 1132       Check-In   Supervising physician immediately available to respond to emergencies See telemetry face sheet for immediately available ER MD    Location [facility]-Cardiac & Pulmonary Rehab    Staff Present Leita Franks RN,BSN;Joseph [facility] BS, Exercise Physiologist;Margaret Best, MS, Exercise Physiologist    Virtual Visit No    Medication changes reported     No    Fall or balance concerns reported    No    Tobacco Cessation No Change    Warm-up and Cool-down Performed on first and last piece of equipment    Resistance Training Performed Yes    VAD Patient? No    PAD/SET Patient? No      Pain Assessment   Currently in Pain? No/denies             Tobacco Use History[1]  Goals Met:  Proper associated with RPD/PD & O2 Sat Independence with exercise equipment Using PLB without cueing & demonstrates good technique Exercise tolerated well No report of concerns or symptoms today Strength training completed today  Goals Unmet:  Not Applicable  Comments: Pt able to follow exercise prescription today without complaint.  Will continue to monitor for progression.    Dr. Oneil Pinal is Medical Director for [facility] Cardiac Rehabilitation.  Dr. Fuad Aleskerov is Medical Director for [facility] Pulmonary Rehabilitation.    [1]  Social History Tobacco Use  Smoking Status Former   Current packs/day: 1.50   Average packs/day: 1.5 packs/day for 30.0 years (45.0 ttl pk-yrs)   Types: Cigarettes  Smokeless Tobacco Never  Tobacco Comments   Quit in 2021.

## 2024-09-20 ENCOUNTER — Encounter: Admitting: Emergency Medicine

## 2024-09-20 DIAGNOSIS — R0609 Other forms of dyspnea: Secondary | ICD-10-CM

## 2024-09-20 NOTE — Progress Notes (Signed)
 Daily Session Note  Patient Details  Name: Michelle Acevedo MRN: [id number] Date of Birth: Dec 11, 1950 Referring Provider:   Flowsheet Row Pulmonary Rehab from 02/16/2024 in [facility] Cardiac and Pulmonary Rehab  Referring Provider Dr. Dickey Orem    Encounter Date: 09/20/2024  Check In:  Session Check In - 09/20/24 1053       Check-In   Supervising physician immediately available to respond to emergencies See telemetry face sheet for immediately available ER MD    Location [facility]-Cardiac & Pulmonary Rehab    Staff Present Leita Franks RN,BSN;Joseph [facility] BS, Exercise Physiologist;Noah Tickle, BS, Exercise Physiologist    Virtual Visit No    Medication changes reported     No    Fall or balance concerns reported    No    Tobacco Cessation No Change    Warm-up and Cool-down Performed on first and last piece of equipment    Resistance Training Performed Yes    VAD Patient? No    PAD/SET Patient? No      Pain Assessment   Currently in Pain? No/denies             Tobacco Use History[1]  Goals Met:  Proper associated with RPD/PD & O2 Sat Independence with exercise equipment Using PLB without cueing & demonstrates good technique Exercise tolerated well No report of concerns or symptoms today Strength training completed today  Goals Unmet:  Not Applicable  Comments: Pt able to follow exercise prescription today without complaint.  Will continue to monitor for progression.    Dr. Oneil Pinal is Medical Director for [facility] Cardiac Rehabilitation.  Dr. Fuad Aleskerov is Medical Director for [facility] Pulmonary Rehabilitation.     [1]  Social History Tobacco Use  Smoking Status Former   Current packs/day: 1.50   Average packs/day: 1.5 packs/day for 30.0 years (45.0 ttl pk-yrs)   Types: Cigarettes  Smokeless Tobacco Never  Tobacco Comments   Quit in 2021.

## 2024-09-23 ENCOUNTER — Encounter

## 2024-09-23 VITALS — Ht 64.5 in | Wt 165.4 lb

## 2024-09-23 DIAGNOSIS — R0609 Other forms of dyspnea: Secondary | ICD-10-CM | POA: Diagnosis not present

## 2024-09-23 NOTE — Progress Notes (Signed)
 Daily Session Note  Patient Details  Name: Michelle Acevedo MRN: [id number] Date of Birth: 03-12-1951 Referring Provider:   Flowsheet Row Pulmonary Rehab from 02/16/2024 in [facility] Cardiac and Pulmonary Rehab  Referring Provider Dr. Dickey Orem    Encounter Date: 09/23/2024  Check In:  Session Check In - 09/23/24 1108       Check-In   Supervising physician immediately available to respond to emergencies See telemetry face sheet for immediately available ER MD    Location [facility]-Cardiac & Pulmonary Rehab    Staff Present Burnard Davenport RN,BSN,MPA;Joseph Rolinda RCP,RRT,BSRT;Laura Cates RN,BSN;Deatrice Spanbauer Dyane BS, ACSM CEP, Exercise Physiologist    Virtual Visit No    Medication changes reported     No    Fall or balance concerns reported    No    Tobacco Cessation No Change    Warm-up and Cool-down Performed on first and last piece of equipment    Resistance Training Performed Yes    VAD Patient? No    PAD/SET Patient? No      Pain Assessment   Currently in Pain? No/denies             Tobacco Use History[1]  Goals Met:  Proper associated with RPD/PD & O2 Sat Independence with exercise equipment Using PLB without cueing & demonstrates good technique Exercise tolerated well Personal goals reviewed No report of concerns or symptoms today Strength training completed today  Goals Unmet:  Not Applicable  Comments: Pt able to follow exercise prescription today without complaint.  Will continue to monitor for progression.   6 Minute Walk     Row Name 09/23/24 1133         6 Minute Walk   Phase Discharge     Distance 1500 feet     Distance % Change 1 %     Distance Feet Change 20 ft     Walk Time 6 minutes     # of Rest Breaks 0     MPH 2.84     METS 2.86     RPE 9     Perceived Dyspnea  0     VO2 Peak 10     Symptoms No     Resting HR 71 bpm     Resting BP 104/62     Resting Oxygen Saturation  97 %     Exercise Oxygen Saturation  during 6 min walk 90 %     Max Ex.  HR 108 bpm     Max Ex. BP 140/82       Interval HR   1 Minute HR 85     2 Minute HR 101     3 Minute HR 92     4 Minute HR 104     5 Minute HR 108     6 Minute HR 106     2 Minute Post HR 78     Interval Heart Rate? Yes       Interval Oxygen   Interval Oxygen? Yes     Baseline Oxygen Saturation % 97 %     1 Minute Oxygen Saturation % 90 %     1 Minute Liters of Oxygen 0 L     2 Minute Oxygen Saturation % 92 %     2 Minute Liters of Oxygen 0 L     3 Minute Oxygen Saturation % 93 %     3 Minute Liters of Oxygen 0 L     4 Minute  Oxygen Saturation % 97 %     4 Minute Liters of Oxygen 0 L     5 Minute Oxygen Saturation % 97 %     5 Minute Liters of Oxygen 0 L     6 Minute Oxygen Saturation % 95 %     6 Minute Liters of Oxygen 0 L     2 Minute Post Oxygen Saturation % 97 %     2 Minute Post Liters of Oxygen 0 L         Dr. Oneil Pinal is Medical Director for [facility] Cardiac Rehabilitation.  Dr. Fuad Aleskerov is Medical Director for [facility] Pulmonary Rehabilitation.     [1]  Social History Tobacco Use  Smoking Status Former   Current packs/day: 1.50   Average packs/day: 1.5 packs/day for 30.0 years (45.0 ttl pk-yrs)   Types: Cigarettes  Smokeless Tobacco Never  Tobacco Comments   Quit in 2021.

## 2024-09-23 NOTE — Patient Instructions (Signed)
 Discharge Patient Instructions  Patient Details  Name: Michelle Acevedo MRN: [id number] Date of Birth: 1951/01/14 Referring Provider:  Rudolpho Norleen BIRCH, MD   Number of Visits: 36  Reason for Discharge:  Patient reached a stable level of exercise. Patient independent in their exercise. Patient has met program and personal goals.  Smoking History:  Social History   Tobacco Use  Smoking Status Former   Current packs/day: 1.50   Average packs/day: 1.5 packs/day for 30.0 years (45.0 ttl pk-yrs)   Types: Cigarettes  Smokeless Tobacco Never  Tobacco Comments   Quit in 2021.    Diagnosis:  Dyspnea on exertion  Initial Exercise Prescription:   Discharge Exercise Prescription (Final Exercise Prescription Changes):  Exercise Prescription Changes - 09/11/24 0700       Response to Exercise   Blood Pressure (Admit) 108/60    Blood Pressure (Exit) 106/60    Heart Rate (Admit) 82 bpm    Heart Rate (Exercise) 120 bpm    Heart Rate (Exit) 91 bpm    Oxygen Saturation (Admit) 95 %    Oxygen Saturation (Exercise) 94 %    Oxygen Saturation (Exit) 95 %    Rating of Perceived Exertion (Exercise) 14    Perceived Dyspnea (Exercise) 0    Symptoms none    Duration Continue with 30 min of aerobic exercise without signs/symptoms of physical distress.    Intensity THRR unchanged      Progression   Progression Continue to progress workloads to maintain intensity without signs/symptoms of physical distress.    Average METs 3.5      Resistance Training   Weight 4 lb    Reps 10-15      Interval Training   Interval Training No      Treadmill   MPH 2.7    Grade 1.5    Minutes 15    METs 3.63      NuStep   Level 1    Minutes 15    METs 3.8      T5 Nustep   Level 1   T6   Minutes 15    METs 3.5      Home Exercise Plan   Plans to continue exercise at Home (comment)   Michelle Acevedo plans to walk her dogs outside for at least 30 minutes, as well as use her treadmill   Frequency Add 2  additional days to program exercise sessions.    Initial Home Exercises Provided 07/17/24      Oxygen   Maintain Oxygen Saturation 88% or higher          Functional Capacity:  6 Minute Walk     Row Name 09/23/24 1133         6 Minute Walk   Phase Discharge     Distance 1500 feet     Distance % Change 1 %     Distance Feet Change 20 ft     Walk Time 6 minutes     # of Rest Breaks 0     MPH 2.84     METS 2.86     RPE 9     Perceived Dyspnea  0     VO2 Peak 10     Symptoms No     Resting HR 71 bpm     Resting BP 104/62     Resting Oxygen Saturation  97 %     Exercise Oxygen Saturation  during 6 min walk 90 %     Max  Ex. HR 108 bpm     Max Ex. BP 140/82       Interval HR   1 Minute HR 85     2 Minute HR 101     3 Minute HR 92     4 Minute HR 104     5 Minute HR 108     6 Minute HR 106     2 Minute Post HR 78     Interval Heart Rate? Yes       Interval Oxygen   Interval Oxygen? Yes     Baseline Oxygen Saturation % 97 %     1 Minute Oxygen Saturation % 90 %     1 Minute Liters of Oxygen 0 L     2 Minute Oxygen Saturation % 92 %     2 Minute Liters of Oxygen 0 L     3 Minute Oxygen Saturation % 93 %     3 Minute Liters of Oxygen 0 L     4 Minute Oxygen Saturation % 97 %     4 Minute Liters of Oxygen 0 L     5 Minute Oxygen Saturation % 97 %     5 Minute Liters of Oxygen 0 L     6 Minute Oxygen Saturation % 95 %     6 Minute Liters of Oxygen 0 L     2 Minute Post Oxygen Saturation % 97 %     2 Minute Post Liters of Oxygen 0 L          Nutrition & Weight - Outcomes:   Post Biometrics - 09/23/24 1139        Post  Biometrics   Height 5' 4.5 (1.638 m)    Weight 165 lb 6.4 oz (75 kg)    Waist Circumference 38 inches    Hip Circumference 45.5 inches    Waist to Hip Ratio 0.84 %    BMI (Calculated) 27.96    Single Leg Stand 3.34 seconds           Goals reviewed with patient; copy given to patient.

## 2024-09-25 ENCOUNTER — Encounter: Admitting: Emergency Medicine

## 2024-09-25 ENCOUNTER — Encounter: Payer: Self-pay | Admitting: *Deleted

## 2024-09-25 DIAGNOSIS — R0609 Other forms of dyspnea: Secondary | ICD-10-CM

## 2024-09-25 NOTE — Progress Notes (Signed)
 Pulmonary Individual Treatment Plan  Patient Details  Name: Michelle Acevedo MRN: [id number] Date of Birth: [birth year] Referring Provider:   Flowsheet Row Pulmonary Rehab from 02/16/2024 in [facility] Cardiac and Pulmonary Rehab  Referring Provider Dr. Dickey Orem    Initial Encounter Date:  Flowsheet Row Pulmonary Rehab from 02/16/2024 in [facility] Cardiac and Pulmonary Rehab  Date 02/16/24    Visit Diagnosis: Dyspnea on exertion  Patient's Home Medications on Admission: Current Medications[1]  Past Medical History: Past Medical History:  Diagnosis Date   Anemia    Arthritis    KNEE KLEFT   Cancer (HCC)    Labia   Chronic hepatitis C with cirrhosis (HCC)    Cirrhosis (HCC)    Emphysema of lung (HCC)    mild   GERD (gastroesophageal reflux disease)    Hepatitis C    History of esophageal varices    Hx of transfusion of whole blood    Macular degeneration    LEFT EYE-RECEIVES INJECTION IN LEFT EYE FOR THIS EVERY 9 WEEKS    Tobacco Use: Tobacco Use History[2]  Labs: Review Flowsheet        No data to display           Pulmonary Assessment Scores:  Pulmonary Assessment Scores     Row Name 09/25/24 1134         ADL UCSD   SOB Score total 42     Rest 0     Walk 0     Stairs 3     Bath 0     Dress 0     Shop 2       CAT Score   CAT Score 18        UCSD: Self-administered rating of dyspnea associated with activities of daily living (ADLs) 6-point scale (0 = not at all to 5 = maximal or unable to do because of breathlessness)  Scoring Scores range from 0 to 120.  Minimally important difference is 5 units  CAT: CAT can identify the health impairment of COPD patients and is better correlated with disease progression.  CAT has a scoring range of zero to 40. The CAT score is classified into four groups of low (less than 10), medium (10 - 20), high (21-30) and very high (31-40) based on the impact level of disease on health status. A CAT score over 10 suggests  significant symptoms.  A worsening CAT score could be explained by an exacerbation, poor medication adherence, poor inhaler technique, or progression of COPD or comorbid conditions.  CAT MCID is 2 points  mMRC: mMRC (Modified Medical Research Council) Dyspnea Scale is used to assess the degree of baseline functional disability in patients of respiratory disease due to dyspnea. No minimal important difference is established. A decrease in score of 1 point or greater is considered a positive change.   Pulmonary Function Assessment:   Exercise Target Goals: Exercise Program Goal: Individual exercise prescription set using results from initial 6 min walk test and THRR while considering  patients activity barriers and safety.   Exercise Prescription Goal: Initial exercise prescription builds to 30-45 minutes a day of aerobic activity, 2-3 days per week.  Home exercise guidelines will be given to patient during program as part of exercise prescription that the participant will acknowledge.  Education: Aerobic Exercise: - Group verbal and visual presentation on the components of exercise prescription. Introduces F.I.T.T principle from ACSM for exercise prescriptions.  Reviews F.I.T.T. principles of aerobic exercise including progression.  Written material provided at class time.   Education: Resistance Exercise: - Group verbal and visual presentation on the components of exercise prescription. Introduces F.I.T.T principle from ACSM for exercise prescriptions  Reviews F.I.T.T. principles of resistance exercise including progression. Written material provided at class time. Flowsheet Row Pulmonary Rehab from 06/19/2024 in [facility] Cardiac and Pulmonary Rehab  Date 06/19/24  Educator nt  Instruction Review Code 1- Tefl Teacher Understanding     Education: Exercise & Equipment Safety: - Individual verbal instruction and demonstration of equipment use and safety with use of the equipment. Flowsheet Row  Pulmonary Rehab from 06/19/2024 in [facility] Cardiac and Pulmonary Rehab  Date 02/14/24  Educator [facility]  Instruction Review Code 1- Verbalizes Understanding    Education: Exercise Physiology & General Exercise Guidelines: - Group verbal and written instruction with models to review the exercise physiology of the cardiovascular system and associated critical values. Provides general exercise guidelines with specific guidelines to those with heart or lung disease.  Flowsheet Row Pulmonary Rehab from 06/19/2024 in [facility] Cardiac and Pulmonary Rehab  Education need identified 02/14/24    Education: Flexibility, Balance, Mind/Body Relaxation: - Group verbal and visual presentation with interactive activity on the components of exercise prescription. Introduces F.I.T.T principle from ACSM for exercise prescriptions. Reviews F.I.T.T. principles of flexibility and balance exercise training including progression. Also discusses the mind body connection.  Reviews various relaxation techniques to help reduce and manage stress (i.e. Deep breathing, progressive muscle relaxation, and visualization). Balance handout provided to take home. Written material provided at class time. Flowsheet Row Pulmonary Rehab from 06/19/2024 in [facility] Cardiac and Pulmonary Rehab  Date 06/19/24  Educator nt  Instruction Review Code 1- Tefl Teacher Understanding    Activity Barriers & Risk Stratification:   6 Minute Walk:  6 Minute Walk     Row Name 09/23/24 1133         6 Minute Walk   Phase Discharge     Distance 1500 feet     Distance % Change 1 %     Distance Feet Change 20 ft     Walk Time 6 minutes     # of Rest Breaks 0     MPH 2.84     METS 2.86     RPE 9     Perceived Dyspnea  0     VO2 Peak 10     Symptoms No     Resting HR 71 bpm     Resting BP 104/62     Resting Oxygen Saturation  97 %     Exercise Oxygen Saturation  during 6 min walk 90 %     Max Ex. HR 108 bpm     Max Ex. BP 140/82       Interval HR    1 Minute HR 85     2 Minute HR 101     3 Minute HR 92     4 Minute HR 104     5 Minute HR 108     6 Minute HR 106     2 Minute Post HR 78     Interval Heart Rate? Yes       Interval Oxygen   Interval Oxygen? Yes     Baseline Oxygen Saturation % 97 %     1 Minute Oxygen Saturation % 90 %     1 Minute Liters of Oxygen 0 L     2 Minute Oxygen Saturation % 92 %     2 Minute  Liters of Oxygen 0 L     3 Minute Oxygen Saturation % 93 %     3 Minute Liters of Oxygen 0 L     4 Minute Oxygen Saturation % 97 %     4 Minute Liters of Oxygen 0 L     5 Minute Oxygen Saturation % 97 %     5 Minute Liters of Oxygen 0 L     6 Minute Oxygen Saturation % 95 %     6 Minute Liters of Oxygen 0 L     2 Minute Post Oxygen Saturation % 97 %     2 Minute Post Liters of Oxygen 0 L       Oxygen Initial Assessment:   Oxygen Re-Evaluation:  Oxygen Re-Evaluation     Row Name 04/24/24 1110 06/17/24 1123 07/15/24 1110 08/12/24 1119 09/23/24 1127     Program Oxygen Prescription   Program Oxygen Prescription None None None None None     Home Oxygen   Home Oxygen Device None None None None None   Sleep Oxygen Prescription None None None None None   Home Exercise Oxygen Prescription None None None None None   Home Resting Oxygen Prescription None None None None None     Goals/Expected Outcomes   Short Term Goals To learn and understand importance of maintaining oxygen saturations>88%;To learn and understand importance of monitoring SPO2 with pulse oximeter and demonstrate accurate use of the pulse oximeter. To learn and understand importance of monitoring SPO2 with pulse oximeter and demonstrate accurate use of the pulse oximeter.;To learn and understand importance of maintaining oxygen saturations>88% To learn and demonstrate proper use of respiratory medications Other To learn and understand importance of maintaining oxygen saturations>88%;To learn and understand importance of monitoring SPO2 with pulse  oximeter and demonstrate accurate use of the pulse oximeter.   Long  Term Goals Verbalizes importance of monitoring SPO2 with pulse oximeter and return demonstration;Maintenance of O2 saturations>88% Maintenance of O2 saturations>88%;Verbalizes importance of monitoring SPO2 with pulse oximeter and return demonstration Demonstrates proper use of MDIs Other Maintenance of O2 saturations>88%;Verbalizes importance of monitoring SPO2 with pulse oximeter and return demonstration   Comments She does not have a pulse oximeter to check her oxygen saturation at home. Informed her where to get one and explained why it is important to have one. Reviewed that oxygen saturations should be 88 percent and above. She does not have a pulse oximeter to check her oxygen saturation at home. Informed her where to get one and explained why it is important to have one. Reviewed that oxygen saturations should be 88 percent and above. Juletta takes her inhalers as needed. She takes albuterol  when she feels short of breath or wheezy. She does not have any other medications for her respiratory issues. She does not want to get a pulse oximete to check her oxygen at home. Michelle Acevedo states she goes for a CT in January to keep on eye on her lung cancer. She has a slow growing form of lung cancer. She feels like her doctors are beating around the bush and giving her vague details. Michelle Acevedo states that she does not have a pulse ox at home. She will be graduating soon and was encouraged to buy a pulse ox to be able to continue to monitor oxygen levels at home. She understands that her oxygen sats should stay above 88.   Goals/Expected Outcomes Short: monitor oxygen at home with exertion. Long: maintain oxygen saturations above 88 percent  independently. Short: monitor oxygen at home with exertion. Long: maintain oxygen saturations above 88 percent independently. Short: continue medications as needed. Long: maintain medications independently. Short:  continue rehab to improve lung function. Long: maintain exercise to keep lung function stable. Short: graduate from pulmonary rehab. buy pulse ox.  Long: maintain stable lung function.      Oxygen Discharge (Final Oxygen Re-Evaluation):  Oxygen Re-Evaluation - 09/23/24 1127       Program Oxygen Prescription   Program Oxygen Prescription None      Home Oxygen   Home Oxygen Device None    Sleep Oxygen Prescription None    Home Exercise Oxygen Prescription None    Home Resting Oxygen Prescription None      Goals/Expected Outcomes   Short Term Goals To learn and understand importance of maintaining oxygen saturations>88%;To learn and understand importance of monitoring SPO2 with pulse oximeter and demonstrate accurate use of the pulse oximeter.    Long  Term Goals Maintenance of O2 saturations>88%;Verbalizes importance of monitoring SPO2 with pulse oximeter and return demonstration    Comments Michelle Acevedo states that she does not have a pulse ox at home. She will be graduating soon and was encouraged to buy a pulse ox to be able to continue to monitor oxygen levels at home. She understands that her oxygen sats should stay above 88.    Goals/Expected Outcomes Short: graduate from pulmonary rehab. buy pulse ox.  Long: maintain stable lung function.          Initial Exercise Prescription:   Perform Capillary Blood Glucose checks as needed.  Exercise Prescription Changes:   Exercise Prescription Changes     Row Name 05/09/24 1600 05/21/24 0800 07/02/24 1500 07/17/24 1100 07/31/24 1500     Response to Exercise   Blood Pressure (Admit) 104/62 110/72 118/62 -- 120/62   Blood Pressure (Exit) 110/58 108/58 122/64 -- 122/82   Heart Rate (Admit) 78 bpm 89 bpm 81 bpm -- 79 bpm   Heart Rate (Exercise) 112 bpm 112 bpm 107 bpm -- 109 bpm   Heart Rate (Exit) 80 bpm 79 bpm 98 bpm -- 80 bpm   Oxygen Saturation (Admit) 95 % 96 % 92 % -- 98 %   Oxygen Saturation (Exercise) 93 % 95 % 93 % -- 95 %    Oxygen Saturation (Exit) 96 % 94 % 93 % -- 97 %   Rating of Perceived Exertion (Exercise) 13 11 12  -- 11   Perceived Dyspnea (Exercise) 1 1 1  -- 0   Symptoms none none none -- none   Duration Continue with 30 min of aerobic exercise without signs/symptoms of physical distress. Continue with 30 min of aerobic exercise without signs/symptoms of physical distress. Continue with 30 min of aerobic exercise without signs/symptoms of physical distress. -- Continue with 30 min of aerobic exercise without signs/symptoms of physical distress.   Intensity THRR unchanged THRR unchanged THRR unchanged -- THRR unchanged     Progression   Progression Continue to progress workloads to maintain intensity without signs/symptoms of physical distress. Continue to progress workloads to maintain intensity without signs/symptoms of physical distress. Continue to progress workloads to maintain intensity without signs/symptoms of physical distress. -- Continue to progress workloads to maintain intensity without signs/symptoms of physical distress.   Average METs 2.98 4.01 2.9 -- 3.21     Resistance Training   Training Prescription Yes Yes Yes -- Yes   Weight 4 lb 4 lb 4 lb -- 4 lb   Reps 10-15  10-15 10-15 -- 10-15     Interval Training   Interval Training No No No -- No     Treadmill   MPH 2.5 2.5 2.5 -- 2.4   Grade 0 0 0 -- 0   Minutes 15 15 15  -- 15   METs 2.91 2.91 2.91 -- 2.84     NuStep   Level 2 2 2  -- 2   Minutes 15 15 15  -- 15   METs 4 5.1 4 -- 4.8     T5 Nustep   Level 1  T6 -- -- -- --   SPM 80 -- -- -- --   Minutes 15 -- -- -- --   METs 2.2 -- -- -- --     Biostep-RELP   Level -- -- 1 -- --   Minutes -- -- 15 -- --   METs -- -- 2 -- --     Home Exercise Plan   Plans to continue exercise at -- -- -- Home (comment)  [name] plans to walk her dogs outside for at least 30 minutes, as well as use her treadmill Home (comment)  [name] plans to walk her dogs outside for at least 30 minutes, as well  as use her treadmill   Frequency -- -- -- Add 2 additional days to program exercise sessions. Add 2 additional days to program exercise sessions.   Initial Home Exercises Provided -- -- -- 07/17/24 07/17/24     Oxygen   Maintain Oxygen Saturation 88% or higher 88% or higher 88% or higher -- 88% or higher    Row Name 08/13/24 1400 08/28/24 1100 09/11/24 0700         Response to Exercise   Blood Pressure (Admit) 102/68 110/60 108/60     Blood Pressure (Exit) 102/68 116/64 106/60     Heart Rate (Admit) 81 bpm 90 bpm 82 bpm     Heart Rate (Exercise) 121 bpm 122 bpm 120 bpm     Heart Rate (Exit) 90 bpm 87 bpm 91 bpm     Oxygen Saturation (Admit) 96 % 95 % 95 %     Oxygen Saturation (Exercise) 88 % 89 % 94 %     Oxygen Saturation (Exit) 92 % 94 % 95 %     Rating of Perceived Exertion (Exercise) 11 12 14      Perceived Dyspnea (Exercise) -- 3 0     Symptoms hip/knee pain none none     Duration Continue with 30 min of aerobic exercise without signs/symptoms of physical distress. Continue with 30 min of aerobic exercise without signs/symptoms of physical distress. Continue with 30 min of aerobic exercise without signs/symptoms of physical distress.     Intensity THRR unchanged THRR unchanged THRR unchanged       Progression   Progression Continue to progress workloads to maintain intensity without signs/symptoms of physical distress. Continue to progress workloads to maintain intensity without signs/symptoms of physical distress. Continue to progress workloads to maintain intensity without signs/symptoms of physical distress.     Average METs 2.88 2.8 3.5       Resistance Training   Training Prescription Yes -- --     Weight 4 lb 4 lb 4 lb     Reps 10-15 10-15 10-15       Interval Training   Interval Training No No No       Treadmill   MPH 2.3 2.7 2.7     Grade 0 1 1.5  Minutes 15 15 15      METs 2.76 3.44 3.63       Recumbant Bike   Level -- 3 --     Watts -- 27 --     Minutes  -- 15 --     METs -- 3.17 --       NuStep   Level 1  T6: level 2 1 1      Minutes 15 15 15      METs 3.6  T6: 2.4 METs 2.5 3.8       T5 Nustep   Level -- 1 1  T6     Minutes -- 15 15     METs -- 2.1 3.5       Home Exercise Plan   Plans to continue exercise at Home (comment)  [name] plans to walk her dogs outside for at least 30 minutes, as well as use her treadmill Home (comment)  [name] plans to walk her dogs outside for at least 30 minutes, as well as use her treadmill Home (comment)  [name] plans to walk her dogs outside for at least 30 minutes, as well as use her treadmill     Frequency Add 2 additional days to program exercise sessions. Add 2 additional days to program exercise sessions. Add 2 additional days to program exercise sessions.     Initial Home Exercises Provided 07/17/24 07/17/24 07/17/24       Oxygen   Maintain Oxygen Saturation 88% or higher 88% or higher 88% or higher        Exercise Comments:   Exercise Goals and Review:   Exercise Goals Re-Evaluation :  Exercise Goals Re-Evaluation     Row Name 04/25/24 1404 05/09/24 1640 05/21/24 0835 06/04/24 1514 06/19/24 1545     Exercise Goal Re-Evaluation   Exercise Goals Review Increase Strength and Stamina;Increase Physical Activity;Understanding of Exercise Prescription Increase Strength and Stamina;Increase Physical Activity;Understanding of Exercise Prescription Increase Strength and Stamina;Increase Physical Activity;Understanding of Exercise Prescription Increase Strength and Stamina;Increase Physical Activity;Understanding of Exercise Prescription Increase Strength and Stamina;Increase Physical Activity;Understanding of Exercise Prescription   Comments Michelle Acevedo has not attended rehab since the last review due to personal reasons. We will continue to monitor her progress when she returns to the program. Michelle Acevedo is doing well in rehab. She returned to rehab in this review. She maintained a speed of 2.5 mph on the treadmill  with no incline. She increased to level 2 on the T4 nustep. She maintained level 1 on the T6 nustep. We will continue to monitor her progress in the program. Michelle Acevedo has only attended one session since the last review. During her one session she continued to work at level 2 on the T4 nustep. She also has maintained her treadmill workload at a speed of 2.5 mph with no incline. We will continue to monitor her progress in the program. Michelle Acevedo has not attended rehab since the last review. She last attended rehab on 05/06/2024. We will reach out to her to see when she plans to return. We will continue to monitor her progress when she returns to the program. Michelle Acevedo did not attend rehab during the last review period. She did return to rehab on 06/17/2024 and did well with exercise. We will continue to monitor her progress in the program.   Expected Outcomes Short: Return to rehab when appropriate. Long: Graduate. Short: Continue to progressively increase treadmill workload. Long: Continue exercise to improve strength and stamina. Short: Attend rehab more consistently. Long: Continue exercise  to improve strength and stamina. Short: Return to rehab when appropriate. Long: Graduate. Short: Continue to attend rehab consistently. Long: Continue exercise to improve strength and stamina.    Row Name 07/02/24 1508 07/16/24 1515 07/17/24 1131 07/31/24 1545 08/13/24 1427     Exercise Goal Re-Evaluation   Exercise Goals Review Increase Strength and Stamina;Increase Physical Activity;Understanding of Exercise Prescription Increase Strength and Stamina;Increase Physical Activity;Understanding of Exercise Prescription Increase Physical Activity;Able to understand and use Dyspnea scale;Understanding of Exercise Prescription;Increase Strength and Stamina;Knowledge and understanding of Target Heart Rate Range (THRR);Able to check pulse independently;Able to understand and use rate of perceived exertion (RPE) scale Increase Physical  Activity;Increase Strength and Stamina;Understanding of Exercise Prescription Increase Physical Activity;Increase Strength and Stamina;Understanding of Exercise Prescription   Comments Michelle Acevedo only attended rehab twice during the last review period. She continues to do well walking at 2.5 mph with no incline on the treadmill. She also continues to do well on the T4 nustep and biostep. We will continue to monitor her progress in the program. Michelle Acevedo did not attend rehab during the last review period. She has since returned to her regular attendance in the program. We will continue to monitor her progress in the program. Reviewed home exercise with pt today from 11:20 to 11:30.  Pt plans to walk her dogs outside, and use her treadmill for exercise.  Reviewed THR, pulse, RPE, sign and symptoms, pulse oximetery and when to call 911 or MD.  Also discussed weather considerations and indoor options.  Pt voiced understanding. Michelle Acevedo continues to do well in the program. She has been able to maintain her treadmill workload at a speed of 2. and no incline, as well as the T4 nustep at level 2. We will continue to monitor her progress in the program. Michelle Acevedo only attended two sessions in rehab since the last review. She continued to walk on the treadmill at a decreased speed of 2.3 mph and no incline. She has been restricted with exercise lately due to hip and knee pain. We will continue to monitor her progress in the program.   Expected Outcomes Short: Attend rehab more consistently. Long: Continue exercise to improve strength and stamina. Short: Attend rehab more consistently. Long: Continue exercise to improve strength and stamina. Short: Implement 2 days of at home exercise to current exercise prescription. Long: Contnue exercise to improve strength and stamina. Short: Increase treadmill workload to a speed of 2.15mph. Long: Continue exercise to improve strength and stamina. Short: Attend rehab more consistently. Long:  Continue exercise to improve strength and stamina.    Row Name 08/28/24 1118 09/11/24 0742 09/23/24 1137         Exercise Goal Re-Evaluation   Exercise Goals Review Increase Physical Activity;Increase Strength and Stamina;Understanding of Exercise Prescription Increase Physical Activity;Increase Strength and Stamina;Understanding of Exercise Prescription Increase Physical Activity;Increase Strength and Stamina;Understanding of Exercise Prescription     Comments Michelle Acevedo is doing well in rehab. She has been able to increase her treadmill workload to a speed of 2. and 1% incline. She was also able to use the T5 and T6 nustep at level 1. We will continue to monitor her progress in the program. Michelle Acevedo is doing well in rehab. She was recently able to increase her workload on the treadmill to a speed of 2. and 1.5% incline. She was also able to maintain level 1 on both the T4 and T6 nustep. We will continue to monitor her progress in the program. [name] imporved by 20 feet in  her 6 MWT. She will graduate soon and plans to continue exercise on her TM at home.     Expected Outcomes Short: Continue to increase treadmill workload. Long: continue exercise to improve strength and stamina. Short: Improve on post-6MWT. Long: Continue exercise to improve strength and stamina. Short: graduate. Long: continue exercise routine upon graduation from pulmonary rehab.        Discharge Exercise Prescription (Final Exercise Prescription Changes):  Exercise Prescription Changes - 09/11/24 0700       Response to Exercise   Blood Pressure (Admit) 108/60    Blood Pressure (Exit) 106/60    Heart Rate (Admit) 82 bpm    Heart Rate (Exercise) 120 bpm    Heart Rate (Exit) 91 bpm    Oxygen Saturation (Admit) 95 %    Oxygen Saturation (Exercise) 94 %    Oxygen Saturation (Exit) 95 %    Rating of Perceived Exertion (Exercise) 14    Perceived Dyspnea (Exercise) 0    Symptoms none    Duration Continue with 30 min of  aerobic exercise without signs/symptoms of physical distress.    Intensity THRR unchanged      Progression   Progression Continue to progress workloads to maintain intensity without signs/symptoms of physical distress.    Average METs 3.5      Resistance Training   Weight 4 lb    Reps 10-15      Interval Training   Interval Training No      Treadmill   MPH 2.7    Grade 1.5    Minutes 15    METs 3.63      NuStep   Level 1    Minutes 15    METs 3.8      T5 Nustep   Level 1   T6   Minutes 15    METs 3.5      Home Exercise Plan   Plans to continue exercise at Home (comment)   [name] plans to walk her dogs outside for at least 30 minutes, as well as use her treadmill   Frequency Add 2 additional days to program exercise sessions.    Initial Home Exercises Provided 07/17/24      Oxygen   Maintain Oxygen Saturation 88% or higher          Nutrition:  Target Goals: Understanding of nutrition guidelines, daily intake of sodium 1500mg , cholesterol 200mg , calories 30% from fat and 7% or less from saturated fats, daily to have 5 or more servings of fruits and vegetables.  Education: Nutrition 1 -Group instruction provided by verbal, written material, interactive activities, discussions, models, and posters to present general guidelines for heart healthy nutrition including macronutrients, label reading, and promoting whole foods over processed counterparts. Education serves as pensions consultant of discussion of heart healthy eating for all. Written material provided at class time.     Education: Nutrition 2 -Group instruction provided by verbal, written material, interactive activities, discussions, models, and posters to present general guidelines for heart healthy nutrition including sodium, cholesterol, and saturated fat. Providing guidance of habit forming to improve blood pressure, cholesterol, and body weight. Written material provided at class time.     Biometrics:    Post Biometrics - 09/23/24 1139        Post  Biometrics   Height 5' 4.5 (1.638 m)    Weight 165 lb 6.4 oz (75 kg)    Waist Circumference 38 inches    Hip Circumference 45.5 inches    Waist  to Hip Ratio 0.84 %    BMI (Calculated) 27.96    Single Leg Stand 3.34 seconds          Nutrition Therapy Plan and Nutrition Goals:   Nutrition Assessments:  MEDIFICTS Score Key: >=70 Need to make dietary changes  40-70 Heart Healthy Diet <= 40 Therapeutic Level Cholesterol Diet  Flowsheet Row Pulmonary Rehab from 09/25/2024 in [facility] Card[facility] Pul[facility] Re[facility]  Picture Your Plate Total Score on Admission 50  Picture Your Plate Total Score on Discharge 51   Picture Your Plate Scores: <59 Unhealthy dietary pattern with much room for improvement. 41-50 Dietary pattern unlikely to meet recommendations for good health and room for improvement. 51-60 More healthful dietary pattern, with some room for improvement.  >60 Healthy dietary pattern, although there may be some specific behaviors that could be improved.   Nutrition Goals Re-Evaluation:  Nutrition Goals Re-Evaluation     Row Name 04/26/24 1115 06/17/24 1122 07/15/24 1114 08/12/24 1115 09/23/24 1113     Goals   Comment Patient deferred RD appointment. Patient deferred RD appointment. Patient deferred RD appointment. Patient deferred RD appointment. Patient deferred RD appointment.      Nutrition Goals Discharge (Final Nutrition Goals Re-Evaluation):  Nutrition Goals Re-Evaluation - 09/23/24 1113       Goals   Comment Patient deferred RD appointment.          Psychosocial: Target Goals: Acknowledge presence or absence of significant depression and/or stress, maximize coping skills, provide positive support system. Participant is able to verbalize types and ability to use techniques and skills needed for reducing stress and depression.   Education: Stress, Anxiety, and Depression - Group verbal and visual presentation to  define topics covered.  Reviews how body is impacted by stress, anxiety, and depression.  Also discusses healthy ways to reduce stress and to treat/manage anxiety and depression.  Written material provided at class time.   Education: Sleep Hygiene -Provides group verbal and written instruction about how sleep can affect your health.  Define sleep hygiene, discuss sleep cycles and impact of sleep habits. Review good sleep hygiene tips.    Initial Review & Psychosocial Screening:   Quality of Life Scores:  Scores of 19 and below usually indicate a poorer quality of life in these areas.  A difference of  2-3 points is a clinically meaningful difference.  A difference of 2-3 points in the total score of the Quality of Life Index has been associated with significant improvement in overall quality of life, self-image, physical symptoms, and general health in studies assessing change in quality of life.  PHQ-9: Review Flowsheet  More data exists      09/25/2024 09/23/2024 08/12/2024 07/15/2024 06/17/2024  Depression screen PHQ 2/9  Decreased Interest 0 0 0 0 0  Down, Depressed, Hopeless 0 0 0 0 0  PHQ - 2 Score 0 0 0 0 0  Altered sleeping 2 1 3 2 3   Tired, decreased energy 2 2 1  0 2  Change in appetite 3 3 1 3 3   Feeling bad or failure about yourself  0 0 0 0 0  Trouble concentrating 0 0 0 0 0  Moving slowly or fidgety/restless 0 0 0 0 0  Suicidal thoughts 0 0 0 0 0  PHQ-9 Score 7 6 5 5  8    Difficult doing work/chores Not difficult at all Somewhat difficult Not difficult at all Not difficult at all Somewhat difficult    Details  Data saved with a previous flowsheet row definition        Interpretation of Total Score  Total Score Depression Severity:  1-4 = Minimal depression, 5-9 = Mild depression, 10-14 = Moderate depression, 15-19 = Moderately severe depression, 20-27 = Severe depression   Psychosocial Evaluation and Intervention:   Psychosocial Re-Evaluation:   Psychosocial Re-Evaluation     Row Name 04/26/24 1121 06/17/24 1127 07/15/24 1117 08/12/24 1117 09/23/24 1117     Psychosocial Re-Evaluation   Current issues with Current Sleep Concerns Current Sleep Concerns Current Sleep Concerns;Current Stress Concerns Current Sleep Concerns;Current Stress Concerns Current Sleep Concerns;Current Stress Concerns   Comments Reviewed patient health questionnaire (PHQ-9) with patient for follow up. Previously, patients score indicated signs/symptoms of depression.  Reviewed to see if patient is improving symptom wise while in program.  Score decreased and patient states that it is because she has trouble sleeping still and is bored. Luvern states she had alot of hobbies and wants to do those sorts of things again. Reviewed patient health questionnaire (PHQ-9) with patient for follow up. Previously, patients score indicated signs/symptoms of depression.  Reviewed to see if patient is improving symptom wise while in program.  Score stayed the same. She has been out prior due to COVID and vacation. Reviewed patient health questionnaire (PHQ-9) with patient for follow up. Previously, patients score indicated signs/symptoms of depression.  Reviewed to see if patient is improving symptom wise while in program.  Score improved and patient states that it is because she has been able to have more energy. Reviewed patient health questionnaire (PHQ-9) with patient for follow up. Previously, patients score indicated signs/symptoms of depression.  Reviewed to see if patient is improving symptom wise while in program.  Score stayed the same. She has been praying more and has a postive outlook on life. Michelle Acevedo reports that she has no stress concerns. Her PHQ 9 score did go up from a 5-6, but she reports no concerns with mental health. She uses a sleeping pill which helps sometimes.she will be graduating in the next week or 2 from pulmonary rehab.   Expected Outcomes Short: work towards a new  hobby. Long: maintain a healthy mental state. Short: Continue to work toward an improvement in PHQ9 scores by attending [facility] regularly. Long: Continue to improve stress and depression coping skills by talking with staff and attending [facility] regularly and work toward a positive mental state. Short: Continue to attend [facility] regularly for regular exercise and social engagement. Long: Continue to improve symptoms and manage a positive mental state. Short: Continue to attend [facility] regularly for regular exercise and social engagement. Long: Continue to improve symptoms and manage a positive mental state. Short:graduate. Long: maintain good mental health routine upon graduation.   Interventions Encouraged to attend Pulmonary Rehabilitation for the exercise Encouraged to attend Pulmonary Rehabilitation for the exercise Encouraged to attend Pulmonary Rehabilitation for the exercise Encouraged to attend Pulmonary Rehabilitation for the exercise Encouraged to attend Pulmonary Rehabilitation for the exercise   Continue Psychosocial Services  Follow up required by staff Follow up required by staff Follow up required by staff Follow up required by staff No Follow up required      Psychosocial Discharge (Final Psychosocial Re-Evaluation):  Psychosocial Re-Evaluation - 09/23/24 1117       Psychosocial Re-Evaluation   Current issues with Current Sleep Concerns;Current Stress Concerns    Comments Michelle Acevedo reports that she has no stress concerns. Her PHQ 9 score did go up from a 5-6,  but she reports no concerns with mental health. She uses a sleeping pill which helps sometimes.she will be graduating in the next week or 2 from pulmonary rehab.    Expected Outcomes Short:graduate. Long: maintain good mental health routine upon graduation.    Interventions Encouraged to attend Pulmonary Rehabilitation for the exercise    Continue Psychosocial Services  No Follow up required          Education: Education  Goals: Education classes will be provided on a weekly basis, covering required topics. Participant will state understanding/return demonstration of topics presented.  Learning Barriers/Preferences:   General Pulmonary Education Topics:  Infection Prevention: - Provides verbal and written material to individual with discussion of infection control including proper hand washing and proper equipment cleaning during exercise session. Flowsheet Row Pulmonary Rehab from 06/19/2024 in [facility] Cardiac and Pulmonary Rehab  Date 02/14/24  Educator [facility]  Instruction Review Code 1- Verbalizes Understanding    Falls Prevention: - Provides verbal and written material to individual with discussion of falls prevention and safety. Flowsheet Row Pulmonary Rehab from 06/19/2024 in [facility] Cardiac and Pulmonary Rehab  Date 02/14/24  Educator [facility]  Instruction Review Code 1- Verbalizes Understanding    Chronic Lung Disease Review: - Group verbal instruction with posters, models, PowerPoint presentations and videos,  to review new updates, new respiratory medications, new advancements in procedures and treatments. Providing information on websites and 800 numbers for continued self-education. Includes information about supplement oxygen, available portable oxygen systems, continuous and intermittent flow rates, oxygen safety, concentrators, and Medicare reimbursement for oxygen. Explanation of Pulmonary Drugs, including class, frequency, complications, importance of spacers, rinsing mouth after steroid MDI's, and proper cleaning methods for nebulizers. Review of basic lung anatomy and physiology related to function, structure, and complications of lung disease. Review of risk factors. Discussion about methods for diagnosing sleep apnea and types of masks and machines for OSA. Includes a review of the use of types of environmental controls: home humidity, furnaces, filters, dust mite/pet prevention, HEPA vacuums. Discussion  about weather changes, air quality and the benefits of nasal washing. Instruction on Warning signs, infection symptoms, calling MD promptly, preventive modes, and value of vaccinations. Review of effective airway clearance, coughing and/or vibration techniques. Emphasizing that all should Create an Action Plan. Written material provided at class time. Flowsheet Row Pulmonary Rehab from 06/19/2024 in [facility] Cardiac and Pulmonary Rehab  Education need identified 02/14/24    AED/CPR: - Group verbal and written instruction with the use of models to demonstrate the basic use of the AED with the basic ABC's of resuscitation.    Tests and Procedures:  - Group verbal and visual presentation and models provide information about basic cardiac anatomy and function. Reviews the testing methods done to diagnose heart disease and the outcomes of the test results. Describes the treatment choices: Medical Management, Angioplasty, or Coronary Bypass Surgery for treating various heart conditions including Myocardial Infarction, Angina, Valve Disease, and Cardiac Arrhythmias.  Written material provided at class time.   Medication Safety: - Group verbal and visual instruction to review commonly prescribed medications for heart and lung disease. Reviews the medication, class of the drug, and side effects. Includes the steps to properly store meds and maintain the prescription regimen.  Written material given at graduation.   Other: -Provides group and verbal instruction on various topics (see comments)   Knowledge Questionnaire Score:  Knowledge Questionnaire Score - 09/25/24 1132       Knowledge Questionnaire Score   Pre Score 14/18  Post Score 16/18           Core Components/Risk Factors/Patient Goals at Admission:   Education:Diabetes - Individual verbal and written instruction to review signs/symptoms of diabetes, desired ranges of glucose level fasting, after meals and with exercise. Acknowledge  that pre and post exercise glucose checks will be done for 3 sessions at entry of program.   Know Your Numbers and Heart Failure: - Group verbal and visual instruction to discuss disease risk factors for cardiac and pulmonary disease and treatment options.  Reviews associated critical values for Overweight/Obesity, Hypertension, Cholesterol, and Diabetes.  Discusses basics of heart failure: signs/symptoms and treatments.  Introduces Heart Failure Zone chart for action plan for heart failure. Written material provided at class time.   Core Components/Risk Factors/Patient Goals Review:   Goals and Risk Factor Review     Row Name 04/26/24 1124 06/17/24 1121 07/15/24 1113 08/12/24 1118 09/23/24 1123     Core Components/Risk Factors/Patient Goals Review   Personal Goals Review Weight Management/Obesity Improve shortness of breath with ADL's Develop more efficient breathing techniques such as purse lipped breathing and diaphragmatic breathing and practicing self-pacing with activity. Develop more efficient breathing techniques such as purse lipped breathing and diaphragmatic breathing and practicing self-pacing with activity. Weight Management/Obesity;Improve shortness of breath with ADL's   Review Michelle Acevedo wants to lose some weight. She feels like she is heavy and wants to reach a weight goal 135lbs. She planes to lose a few pounds in the next few weeks. patient is back from being on a medical hold. Spoke to patient again about their shortness of breath and what they can do to improve. Patient has been informed of breathing techniques when starting the program. Patient is informed to tell staff if they have had any med changes and that certain meds they are taking or not taking can be causing shortness of breath. Diaphragmatic and PLB breathing explained and performed with patient. Patient has a better understanding of how to do these exercises to help with breathing performance and relaxation. Patient  performed breathing techniques adequately and to practice further at home. Michelle Acevedo states that she is not doing breathing techniques. She states that she just hasnt done them. Informed her that is is important to do especially when she feels short of breath. Patient will try to work on PLB techniques. Michelle Acevedo reports that she has noticed improvments in SOB and that her oxygen sats are always in the 90's during exercise in class. Her weight has been steady but she did lose an inch in her waist since starting the program.   Expected Outcomes Short: lose a few pounds.Long: reach weight goal. Short: Attend [facility] regularly to improve shortness of breath with ADL's. Long: maintain independence with ADL's Short: practice PLB and diaphragmatic breathing at home. Long: Use PLB and diaphragmatic breathing independently post [facility]. Short: practice PLB and diaphragmatic breathing at home. Long: Use PLB and diaphragmatic breathing independently post [facility]. Short: graudate from pulmonary rehab. Long: continue to weigh at home ans work towards    Row Name 09/23/24 1125             Core Components/Risk Factors/Patient Goals Review   Review Michelle Acevedo reports that she has noticed improvments in SOB and that her oxygen sats are always in the 90's during exercise in class. Her weight has been steady but she did lose an inch in her waist since starting the program.       Expected Outcomes Short: graudate from pulmonary rehab.  Long: continue to weigh at home and work towards weight loss goal of 5-10 lbs. continue to improve in SOB.          Core Components/Risk Factors/Patient Goals at Discharge (Final Review):   Goals and Risk Factor Review - 09/23/24 1125       Core Components/Risk Factors/Patient Goals Review   Review Michelle Acevedo reports that she has noticed improvments in SOB and that her oxygen sats are always in the 90's during exercise in class. Her weight has been steady but she did lose an inch in her waist  since starting the program.    Expected Outcomes Short: graudate from pulmonary rehab. Long: continue to weigh at home and work towards weight loss goal of 5-10 lbs. continue to improve in SOB.          ITP Comments:  ITP Comments     Row Name 04/10/24 1008 05/08/24 1036 05/08/24 1042 06/05/24 1116 07/03/24 1054   ITP Comments 30 Day review completed. Medical Director ITP review done, changes made as directed, and signed approval by Medical Director. 30 Day review completed. Medical Director ITP review done, changes made as directed, and signed approval by Medical Director. Virtual Visit completed. Patient informed on EP and RD appointment and 6 Minute walk test. Patient also informed of patient health questionnaires on My Chart. Patient Verbalizes understanding. For visist on 02/14/2024. 30 Day review completed. Medical Director ITP review done; changes made as directed and signed approval by Medical Director. New to program. 30 Day review completed. Medical Director ITP review done; changes made as directed and signed approval by Medical Director.    Row Name 07/31/24 1002 08/28/24 0947 09/25/24 1148       ITP Comments 30 Day review completed. Medical Director ITP review done; changes made as directed and signed approval by Medical Director. 30 Day review completed. Medical Director ITP review done, changes made as directed, and signed approval by Medical Director. 30 Day review completed. Medical Director ITP review done, changes made as directed, and signed approval by Medical Director.        Comments: 30 Day review     [1]  Current Outpatient Medications:    acetaminophen  (TYLENOL ) 325 MG tablet, Take 325 mg by mouth., Disp: , Rfl:    acetaminophen  (TYLENOL ) 500 MG tablet, Take 500 mg by mouth every 6 (six) hours as needed for moderate pain or headache., Disp: , Rfl:    albuterol  (VENTOLIN  HFA) 108 (90 Base) MCG/ACT inhaler, Inhale 1-2 puffs into the lungs every 6 (six) hours as  needed for wheezing or shortness of breath. (Patient not taking: Reported on 02/14/2024), Disp: 18 g, Rfl: 0   albuterol  (VENTOLIN  HFA) 108 (90 Base) MCG/ACT inhaler, Inhale 2 puffs into the lungs., Disp: , Rfl:    Albuterol  Sulfate (PROAIR  RESPICLICK) 108 (90 Base) MCG/ACT AEPB, Inhale 2 puffs into the lungs., Disp: , Rfl:    fluticasone  (FLONASE ) 50 MCG/ACT nasal spray, Place 1 spray into both nostrils daily for 7 days., Disp: 1 g, Rfl: 0   furosemide (LASIX) 40 MG tablet, Take 40 mg by mouth daily.  (Patient not taking: Reported on 02/14/2024), Disp: , Rfl:    Ipratropium-Albuterol  (COMBIVENT) 20-100 MCG/ACT AERS respimat, Inhale 2 puffs into the lungs 3 (three) times daily as needed for shortness of breath., Disp: , Rfl:    lactulose (CHRONULAC) 10 GM/15ML solution, Take 10 g by mouth 3 (three) times daily as needed (for elevated ammonia level)., Disp: , Rfl:  NON FORMULARY, B3, Disp: , Rfl:    oxyCODONE  (ROXICODONE ) 5 MG immediate release tablet, Take 1 tablet (5 mg total) by mouth every 8 (eight) hours as needed for up to 16 doses for breakthrough pain or severe pain. (Patient not taking: Reported on 02/14/2024), Disp: 16 tablet, Rfl: 0   pantoprazole (PROTONIX) 40 MG tablet, Take 40 mg by mouth every morning.  (Patient not taking: Reported on 02/14/2024), Disp: , Rfl:    pantoprazole (PROTONIX) 40 MG tablet, Take 40 mg by mouth., Disp: , Rfl:    Potassium 99 MG TABS, Take 99 mg by mouth daily., Disp: , Rfl:    rifaximin (XIFAXAN) 550 MG TABS tablet, Take 550 mg by mouth every morning.  (Patient not taking: Reported on 02/14/2024), Disp: , Rfl:    spironolactone (ALDACTONE) 100 MG tablet, Take 100 mg by mouth daily. , Disp: , Rfl:    tacrolimus (PROGRAF) 5 MG capsule, Take 25 mg by mouth 2 (two) times daily., Disp: , Rfl:    traZODone (DESYREL) 50 MG tablet, Take 50 mg by mouth at bedtime., Disp: , Rfl:  [2]  Social History Tobacco Use  Smoking Status Former   Current packs/day: 1.50   Average  packs/day: 1.5 packs/day for 30.0 years (45.0 ttl pk-yrs)   Types: Cigarettes  Smokeless Tobacco Never  Tobacco Comments   Quit in 2021.

## 2024-09-25 NOTE — Progress Notes (Signed)
 Daily Session Note  Patient Details  Name: Michelle Acevedo MRN: [id number] Date of Birth: 05-08-1951 Referring Provider:   Flowsheet Row Pulmonary Rehab from 02/16/2024 in [facility] Cardiac and Pulmonary Rehab  Referring Provider Dr. Dickey Orem    Encounter Date: 09/25/2024  Check In:  Session Check In - 09/25/24 1108       Check-In   Supervising physician immediately available to respond to emergencies See telemetry face sheet for immediately available ER MD    Location [facility]-Cardiac & Pulmonary Rehab    Staff Present Leita Franks RN,BSN;[facility] BS, Exercise Physiologist;Margaret Best, MS, Exercise Physiologist    Virtual Visit No    Medication changes reported     No    Fall or balance concerns reported    No    Tobacco Cessation No Change    Warm-up and Cool-down Performed on first and last piece of equipment    Resistance Training Performed Yes    VAD Patient? No    PAD/SET Patient? No      Pain Assessment   Currently in Pain? No/denies             Tobacco Use History[1]  Goals Met:  Proper associated with RPD/PD & O2 Sat Independence with exercise equipment Using PLB without cueing & demonstrates good technique Exercise tolerated well No report of concerns or symptoms today Strength training completed today  Goals Unmet:  Not Applicable  Comments: Pt able to follow exercise prescription today without complaint.  Will continue to monitor for progression.    Dr. Oneil Pinal is Medical Director for [facility] Cardiac Rehabilitation.  Dr. Fuad Aleskerov is Medical Director for [facility] Pulmonary Rehabilitation.    [1]  Social History Tobacco Use  Smoking Status Former   Current packs/day: 1.50   Average packs/day: 1.5 packs/day for 30.0 years (45.0 ttl pk-yrs)   Types: Cigarettes  Smokeless Tobacco Never  Tobacco Comments   Quit in 2021.

## 2024-09-27 ENCOUNTER — Encounter

## 2024-09-30 ENCOUNTER — Encounter

## 2024-09-30 DIAGNOSIS — R0609 Other forms of dyspnea: Secondary | ICD-10-CM

## 2024-09-30 LAB — GLUCOSE, CAPILLARY: Glucose-Capillary: 136 mg/dL — ABNORMAL HIGH (ref 70–99)

## 2024-09-30 NOTE — Progress Notes (Signed)
 Daily Session Note  Patient Details  Name: Michelle Acevedo MRN: [id number] Date of Birth: November 09, 1950 Referring Provider:   Flowsheet Row Pulmonary Rehab from 02/16/2024 in [facility] Cardiac and Pulmonary Rehab  Referring Provider Dr. Dickey Orem    Encounter Date: 09/30/2024  Check In:  Session Check In - 09/30/24 1056       Check-In   Supervising physician immediately available to respond to emergencies See telemetry face sheet for immediately available ER MD    Location [facility]-Cardiac & Pulmonary Rehab    Staff Present Burnard Davenport RN,BSN,MPA;Joseph Rolinda RCP,RRT,BSRT;Laura Cates RN,BSN;Clark Cuff Dyane BS, ACSM CEP, Exercise Physiologist    Virtual Visit No    Medication changes reported     No    Fall or balance concerns reported    No    Tobacco Cessation No Change    Warm-up and Cool-down Performed on first and last piece of equipment    Resistance Training Performed Yes    VAD Patient? No    PAD/SET Patient? No      Pain Assessment   Currently in Pain? No/denies             Tobacco Use History[1]  Goals Met:  Proper associated with RPD/PD & O2 Sat Independence with exercise equipment Using PLB without cueing & demonstrates good technique Exercise tolerated well No report of concerns or symptoms today Strength training completed today  Goals Unmet:  Not Applicable  Comments: Pt able to follow exercise prescription today without complaint.  Will continue to monitor for progression.    Dr. Oneil Pinal is Medical Director for [facility] Cardiac Rehabilitation.  Dr. Fuad Aleskerov is Medical Director for [facility] Pulmonary Rehabilitation.    [1]  Social History Tobacco Use  Smoking Status Former   Current packs/day: 1.50   Average packs/day: 1.5 packs/day for 30.0 years (45.0 ttl pk-yrs)   Types: Cigarettes  Smokeless Tobacco Never  Tobacco Comments   Quit in 2021.

## 2024-10-02 ENCOUNTER — Encounter: Admitting: Emergency Medicine

## 2024-10-02 DIAGNOSIS — R0609 Other forms of dyspnea: Secondary | ICD-10-CM | POA: Diagnosis not present

## 2024-10-02 NOTE — Progress Notes (Signed)
 Daily Session Note  Patient Details  Name: Michelle Acevedo MRN: [id number] Date of Birth: April 04, 1951 Referring Provider:   Flowsheet Row Pulmonary Rehab from 02/16/2024 in [facility] Cardiac and Pulmonary Rehab  Referring Provider Dr. Dickey Orem    Encounter Date: 10/02/2024  Check In:  Session Check In - 10/02/24 1114       Check-In   Supervising physician immediately available to respond to emergencies See telemetry face sheet for immediately available ER MD    Location [facility]-Cardiac & Pulmonary Rehab    Staff Present Leita Franks RN,BSN;[facility] BS, Exercise Physiologist;Margaret Best, MS, Exercise Physiologist    Virtual Visit No    Medication changes reported     No    Fall or balance concerns reported    No    Tobacco Cessation No Change    Warm-up and Cool-down Performed on first and last piece of equipment    Resistance Training Performed Yes    VAD Patient? No    PAD/SET Patient? No      Pain Assessment   Currently in Pain? No/denies             Tobacco Use History[1]  Goals Met:  Proper associated with RPD/PD & O2 Sat Independence with exercise equipment Using PLB without cueing & demonstrates good technique Exercise tolerated well No report of concerns or symptoms today Strength training completed today  Goals Unmet:  Not Applicable  Comments: Pt able to follow exercise prescription today without complaint.  Will continue to monitor for progression.    Dr. Oneil Pinal is Medical Director for [facility] Cardiac Rehabilitation.  Dr. Fuad Aleskerov is Medical Director for [facility] Pulmonary Rehabilitation.    [1]  Social History Tobacco Use  Smoking Status Former   Current packs/day: 1.50   Average packs/day: 1.5 packs/day for 30.0 years (45.0 ttl pk-yrs)   Types: Cigarettes  Smokeless Tobacco Never  Tobacco Comments   Quit in 2021.

## 2024-10-04 ENCOUNTER — Encounter

## 2024-10-07 ENCOUNTER — Encounter

## 2024-10-09 ENCOUNTER — Encounter

## 2024-10-09 DIAGNOSIS — R0609 Other forms of dyspnea: Secondary | ICD-10-CM

## 2024-10-09 NOTE — Progress Notes (Signed)
 Daily Session Note  Patient Details  Name: Michelle Acevedo MRN: [id number] Date of Birth: 1951/02/16 Referring Provider:   Flowsheet Row Pulmonary Rehab from 02/16/2024 in [facility] Cardiac and Pulmonary Rehab  Referring Provider Dr. Dickey Orem    Encounter Date: 10/09/2024  Check In:  Session Check In - 10/09/24 1054       Check-In   Supervising physician immediately available to respond to emergencies See telemetry face sheet for immediately available ER MD    Location [facility]-Cardiac & Pulmonary Rehab    Staff Present Burnard Davenport RN,BSN,MPA;Margaret Best, MS, Exercise Physiologist;Noah Tickle, BS, Exercise Physiologist;Laura Cates RN,BSN    Virtual Visit No    Medication changes reported     No    Fall or balance concerns reported    No    Tobacco Cessation No Change    Warm-up and Cool-down Performed on first and last piece of equipment    Resistance Training Performed Yes    VAD Patient? No    PAD/SET Patient? No      Pain Assessment   Currently in Pain? No/denies             Tobacco Use History[1]  Goals Met:  Proper associated with RPD/PD & O2 Sat Independence with exercise equipment Using PLB without cueing & demonstrates good technique Exercise tolerated well No report of concerns or symptoms today Strength training completed today  Goals Unmet:  Not Applicable  Comments: Pt able to follow exercise prescription today without complaint.  Will continue to monitor for progression.    Dr. Oneil Pinal is Medical Director for [facility] Cardiac Rehabilitation.  Dr. Fuad Aleskerov is Medical Director for [facility] Pulmonary Rehabilitation.    [1]  Social History Tobacco Use  Smoking Status Former   Current packs/day: 1.50   Average packs/day: 1.5 packs/day for 30.0 years (45.0 ttl pk-yrs)   Types: Cigarettes  Smokeless Tobacco Never  Tobacco Comments   Quit in 2021.

## 2024-10-11 ENCOUNTER — Encounter

## 2024-10-11 DIAGNOSIS — R0609 Other forms of dyspnea: Secondary | ICD-10-CM

## 2024-10-11 NOTE — Progress Notes (Signed)
 Discharge Summary Patient: Michelle Acevedo DOB: 1951-08-25  [place] graduated today from  rehab with 35 sessions completed.  Details of the patient's exercise prescription and what She needs to do in order to continue the prescription and progress were discussed with patient.  Patient was given a copy of prescription and goals.  Patient verbalized understanding. [name] plans to continue to exercise by walking at home outside and on her treadmill.   6 Minute Walk     Row Name 09/23/24 1133         6 Minute Walk   Phase Discharge     Distance 1500 feet     Distance % Change 1 %     Distance Feet Change 20 ft     Walk Time 6 minutes     # of Rest Breaks 0     MPH 2.84     METS 2.86     RPE 9     Perceived Dyspnea  0     VO2 Peak 10     Symptoms No     Resting HR 71 bpm     Resting BP 104/62     Resting Oxygen Saturation  97 %     Exercise Oxygen Saturation  during 6 min walk 90 %     Max Ex. HR 108 bpm     Max Ex. BP 140/82       Interval HR   1 Minute HR 85     2 Minute HR 101     3 Minute HR 92     4 Minute HR 104     5 Minute HR 108     6 Minute HR 106     2 Minute Post HR 78     Interval Heart Rate? Yes       Interval Oxygen   Interval Oxygen? Yes     Baseline Oxygen Saturation % 97 %     1 Minute Oxygen Saturation % 90 %     1 Minute Liters of Oxygen 0 L     2 Minute Oxygen Saturation % 92 %     2 Minute Liters of Oxygen 0 L     3 Minute Oxygen Saturation % 93 %     3 Minute Liters of Oxygen 0 L     4 Minute Oxygen Saturation % 97 %     4 Minute Liters of Oxygen 0 L     5 Minute Oxygen Saturation % 97 %     5 Minute Liters of Oxygen 0 L     6 Minute Oxygen Saturation % 95 %     6 Minute Liters of Oxygen 0 L     2 Minute Post Oxygen Saturation % 97 %     2 Minute Post Liters of Oxygen 0 L

## 2024-10-11 NOTE — Progress Notes (Signed)
 Pulmonary Individual Treatment Plan  Patient Details  Name: Michelle Acevedo MRN: [id number] Date of Birth: 04/30/1951 Referring Provider:   Flowsheet Row Pulmonary Rehab from 02/16/2024 in [facility] Cardiac and Pulmonary Rehab  Referring Provider Dr. Dickey Orem    Initial Encounter Date:  Flowsheet Row Pulmonary Rehab from 02/16/2024 in [facility] Cardiac and Pulmonary Rehab  Date 02/16/24    Visit Diagnosis: Dyspnea on exertion  Patient's Home Medications on Admission: Current Medications[1]  Past Medical History: Past Medical History:  Diagnosis Date   Anemia    Arthritis    KNEE KLEFT   Cancer (HCC)    Labia   Chronic hepatitis C with cirrhosis (HCC)    Cirrhosis (HCC)    Emphysema of lung (HCC)    mild   GERD (gastroesophageal reflux disease)    Hepatitis C    History of esophageal varices    Hx of transfusion of whole blood    Macular degeneration    LEFT EYE-RECEIVES INJECTION IN LEFT EYE FOR THIS EVERY 9 WEEKS    Tobacco Use: Tobacco Use History[2]  Labs: Review Flowsheet        No data to display           Pulmonary Assessment Scores:  Pulmonary Assessment Scores     Row Name 09/25/24 1134         ADL UCSD   SOB Score total 42     Rest 0     Walk 0     Stairs 3     Bath 0     Dress 0     Shop 2       CAT Score   CAT Score 18        UCSD: Self-administered rating of dyspnea associated with activities of daily living (ADLs) 6-point scale (0 = not at all to 5 = maximal or unable to do because of breathlessness)  Scoring Scores range from 0 to 120.  Minimally important difference is 5 units  CAT: CAT can identify the health impairment of COPD patients and is better correlated with disease progression.  CAT has a scoring range of zero to 40. The CAT score is classified into four groups of low (less than 10), medium (10 - 20), high (21-30) and very high (31-40) based on the impact level of disease on health status. A CAT score over 10 suggests  significant symptoms.  A worsening CAT score could be explained by an exacerbation, poor medication adherence, poor inhaler technique, or progression of COPD or comorbid conditions.  CAT MCID is 2 points  mMRC: mMRC (Modified Medical Research Council) Dyspnea Scale is used to assess the degree of baseline functional disability in patients of respiratory disease due to dyspnea. No minimal important difference is established. A decrease in score of 1 point or greater is considered a positive change.   Pulmonary Function Assessment:   Exercise Target Goals: Exercise Program Goal: Individual exercise prescription set using results from initial 6 min walk test and THRR while considering  patients activity barriers and safety.   Exercise Prescription Goal: Initial exercise prescription builds to 30-45 minutes a day of aerobic activity, 2-3 days per week.  Home exercise guidelines will be given to patient during program as part of exercise prescription that the participant will acknowledge.  Education: Aerobic Exercise: - Group verbal and visual presentation on the components of exercise prescription. Introduces F.I.T.T principle from ACSM for exercise prescriptions.  Reviews F.I.T.T. principles of aerobic exercise including progression.  Written material provided at class time.   Education: Resistance Exercise: - Group verbal and visual presentation on the components of exercise prescription. Introduces F.I.T.T principle from ACSM for exercise prescriptions  Reviews F.I.T.T. principles of resistance exercise including progression. Written material provided at class time. Flowsheet Row Pulmonary Rehab from 06/19/2024 in [facility] Cardiac and Pulmonary Rehab  Date 06/19/24  Educator nt  Instruction Review Code 1- Tefl Teacher Understanding     Education: Exercise & Equipment Safety: - Individual verbal instruction and demonstration of equipment use and safety with use of the equipment. Flowsheet Row  Pulmonary Rehab from 06/19/2024 in [facility] Cardiac and Pulmonary Rehab  Date 02/14/24  Educator [facility]  Instruction Review Code 1- Verbalizes Understanding    Education: Exercise Physiology & General Exercise Guidelines: - Group verbal and written instruction with models to review the exercise physiology of the cardiovascular system and associated critical values. Provides general exercise guidelines with specific guidelines to those with heart or lung disease.  Flowsheet Row Pulmonary Rehab from 06/19/2024 in [facility] Cardiac and Pulmonary Rehab  Education need identified 02/14/24    Education: Flexibility, Balance, Mind/Body Relaxation: - Group verbal and visual presentation with interactive activity on the components of exercise prescription. Introduces F.I.T.T principle from ACSM for exercise prescriptions. Reviews F.I.T.T. principles of flexibility and balance exercise training including progression. Also discusses the mind body connection.  Reviews various relaxation techniques to help reduce and manage stress (i.e. Deep breathing, progressive muscle relaxation, and visualization). Balance handout provided to take home. Written material provided at class time. Flowsheet Row Pulmonary Rehab from 06/19/2024 in [facility] Cardiac and Pulmonary Rehab  Date 06/19/24  Educator nt  Instruction Review Code 1- Tefl Teacher Understanding    Activity Barriers & Risk Stratification:   6 Minute Walk:  6 Minute Walk     Row Name 09/23/24 1133         6 Minute Walk   Phase Discharge     Distance 1500 feet     Distance % Change 1 %     Distance Feet Change 20 ft     Walk Time 6 minutes     # of Rest Breaks 0     MPH 2.84     METS 2.86     RPE 9     Perceived Dyspnea  0     VO2 Peak 10     Symptoms No     Resting HR 71 bpm     Resting BP 104/62     Resting Oxygen Saturation  97 %     Exercise Oxygen Saturation  during 6 min walk 90 %     Max Ex. HR 108 bpm     Max Ex. BP 140/82       Interval HR    1 Minute HR 85     2 Minute HR 101     3 Minute HR 92     4 Minute HR 104     5 Minute HR 108     6 Minute HR 106     2 Minute Post HR 78     Interval Heart Rate? Yes       Interval Oxygen   Interval Oxygen? Yes     Baseline Oxygen Saturation % 97 %     1 Minute Oxygen Saturation % 90 %     1 Minute Liters of Oxygen 0 L     2 Minute Oxygen Saturation % 92 %     2 Minute  Liters of Oxygen 0 L     3 Minute Oxygen Saturation % 93 %     3 Minute Liters of Oxygen 0 L     4 Minute Oxygen Saturation % 97 %     4 Minute Liters of Oxygen 0 L     5 Minute Oxygen Saturation % 97 %     5 Minute Liters of Oxygen 0 L     6 Minute Oxygen Saturation % 95 %     6 Minute Liters of Oxygen 0 L     2 Minute Post Oxygen Saturation % 97 %     2 Minute Post Liters of Oxygen 0 L       Oxygen Initial Assessment:   Oxygen Re-Evaluation:  Oxygen Re-Evaluation     Row Name 04/24/24 1110 06/17/24 1123 07/15/24 1110 08/12/24 1119 09/23/24 1127     Program Oxygen Prescription   Program Oxygen Prescription None None None None None     Home Oxygen   Home Oxygen Device None None None None None   Sleep Oxygen Prescription None None None None None   Home Exercise Oxygen Prescription None None None None None   Home Resting Oxygen Prescription None None None None None     Goals/Expected Outcomes   Short Term Goals To learn and understand importance of maintaining oxygen saturations>88%;To learn and understand importance of monitoring SPO2 with pulse oximeter and demonstrate accurate use of the pulse oximeter. To learn and understand importance of monitoring SPO2 with pulse oximeter and demonstrate accurate use of the pulse oximeter.;To learn and understand importance of maintaining oxygen saturations>88% To learn and demonstrate proper use of respiratory medications Other To learn and understand importance of maintaining oxygen saturations>88%;To learn and understand importance of monitoring SPO2 with pulse  oximeter and demonstrate accurate use of the pulse oximeter.   Long  Term Goals Verbalizes importance of monitoring SPO2 with pulse oximeter and return demonstration;Maintenance of O2 saturations>88% Maintenance of O2 saturations>88%;Verbalizes importance of monitoring SPO2 with pulse oximeter and return demonstration Demonstrates proper use of MDIs Other Maintenance of O2 saturations>88%;Verbalizes importance of monitoring SPO2 with pulse oximeter and return demonstration   Comments She does not have a pulse oximeter to check her oxygen saturation at home. Informed her where to get one and explained why it is important to have one. Reviewed that oxygen saturations should be 88 percent and above. She does not have a pulse oximeter to check her oxygen saturation at home. Informed her where to get one and explained why it is important to have one. Reviewed that oxygen saturations should be 88 percent and above. Trany takes her inhalers as needed. She takes albuterol  when she feels short of breath or wheezy. She does not have any other medications for her respiratory issues. She does not want to get a pulse oximete to check her oxygen at home. Rhoda states she goes for a CT in January to keep on eye on her lung cancer. She has a slow growing form of lung cancer. She feels like her doctors are beating around the bush and giving her vague details. Rhoda states that she does not have a pulse ox at home. She will be graduating soon and was encouraged to buy a pulse ox to be able to continue to monitor oxygen levels at home. She understands that her oxygen sats should stay above 88.   Goals/Expected Outcomes Short: monitor oxygen at home with exertion. Long: maintain oxygen saturations above 88 percent  independently. Short: monitor oxygen at home with exertion. Long: maintain oxygen saturations above 88 percent independently. Short: continue medications as needed. Long: maintain medications independently. Short:  continue rehab to improve lung function. Long: maintain exercise to keep lung function stable. Short: graduate from pulmonary rehab. buy pulse ox.  Long: maintain stable lung function.      Oxygen Discharge (Final Oxygen Re-Evaluation):  Oxygen Re-Evaluation - 09/23/24 1127       Program Oxygen Prescription   Program Oxygen Prescription None      Home Oxygen   Home Oxygen Device None    Sleep Oxygen Prescription None    Home Exercise Oxygen Prescription None    Home Resting Oxygen Prescription None      Goals/Expected Outcomes   Short Term Goals To learn and understand importance of maintaining oxygen saturations>88%;To learn and understand importance of monitoring SPO2 with pulse oximeter and demonstrate accurate use of the pulse oximeter.    Long  Term Goals Maintenance of O2 saturations>88%;Verbalizes importance of monitoring SPO2 with pulse oximeter and return demonstration    Comments Rhoda states that she does not have a pulse ox at home. She will be graduating soon and was encouraged to buy a pulse ox to be able to continue to monitor oxygen levels at home. She understands that her oxygen sats should stay above 88.    Goals/Expected Outcomes Short: graduate from pulmonary rehab. buy pulse ox.  Long: maintain stable lung function.          Initial Exercise Prescription:   Perform Capillary Blood Glucose checks as needed.  Exercise Prescription Changes:   Exercise Prescription Changes     Row Name 05/09/24 1600 05/21/24 0800 07/02/24 1500 07/17/24 1100 07/31/24 1500     Response to Exercise   Blood Pressure (Admit) 104/62 110/72 118/62 -- 120/62   Blood Pressure (Exit) 110/58 108/58 122/64 -- 122/82   Heart Rate (Admit) 78 bpm 89 bpm 81 bpm -- 79 bpm   Heart Rate (Exercise) 112 bpm 112 bpm 107 bpm -- 109 bpm   Heart Rate (Exit) 80 bpm 79 bpm 98 bpm -- 80 bpm   Oxygen Saturation (Admit) 95 % 96 % 92 % -- 98 %   Oxygen Saturation (Exercise) 93 % 95 % 93 % -- 95 %    Oxygen Saturation (Exit) 96 % 94 % 93 % -- 97 %   Rating of Perceived Exertion (Exercise) 13 11 12  -- 11   Perceived Dyspnea (Exercise) 1 1 1  -- 0   Symptoms none none none -- none   Duration Continue with 30 min of aerobic exercise without signs/symptoms of physical distress. Continue with 30 min of aerobic exercise without signs/symptoms of physical distress. Continue with 30 min of aerobic exercise without signs/symptoms of physical distress. -- Continue with 30 min of aerobic exercise without signs/symptoms of physical distress.   Intensity THRR unchanged THRR unchanged THRR unchanged -- THRR unchanged     Progression   Progression Continue to progress workloads to maintain intensity without signs/symptoms of physical distress. Continue to progress workloads to maintain intensity without signs/symptoms of physical distress. Continue to progress workloads to maintain intensity without signs/symptoms of physical distress. -- Continue to progress workloads to maintain intensity without signs/symptoms of physical distress.   Average METs 2.98 4.01 2.9 -- 3.21     Resistance Training   Training Prescription Yes Yes Yes -- Yes   Weight 4 lb 4 lb 4 lb -- 4 lb   Reps 10-15  10-15 10-15 -- 10-15     Interval Training   Interval Training No No No -- No     Treadmill   MPH 2.5 2.5 2.5 -- 2.4   Grade 0 0 0 -- 0   Minutes 15 15 15  -- 15   METs 2.91 2.91 2.91 -- 2.84     NuStep   Level 2 2 2  -- 2   Minutes 15 15 15  -- 15   METs 4 5.1 4 -- 4.8     T5 Nustep   Level 1  T6 -- -- -- --   SPM 80 -- -- -- --   Minutes 15 -- -- -- --   METs 2.2 -- -- -- --     Biostep-RELP   Level -- -- 1 -- --   Minutes -- -- 15 -- --   METs -- -- 2 -- --     Home Exercise Plan   Plans to continue exercise at -- -- -- Home (comment)  [name] plans to walk her dogs outside for at least 30 minutes, as well as use her treadmill Home (comment)  [name] plans to walk her dogs outside for at least 30 minutes, as well  as use her treadmill   Frequency -- -- -- Add 2 additional days to program exercise sessions. Add 2 additional days to program exercise sessions.   Initial Home Exercises Provided -- -- -- 07/17/24 07/17/24     Oxygen   Maintain Oxygen Saturation 88% or higher 88% or higher 88% or higher -- 88% or higher    Row Name 08/13/24 1400 08/28/24 1100 09/11/24 0700 09/30/24 0900 10/10/24 1300     Response to Exercise   Blood Pressure (Admit) 102/68 110/60 108/60 96/52 100/60   Blood Pressure (Exit) 102/68 116/64 106/60 122/64 106/70   Heart Rate (Admit) 81 bpm 90 bpm 82 bpm 73 bpm 83 bpm   Heart Rate (Exercise) 121 bpm 122 bpm 120 bpm 119 bpm 114 bpm   Heart Rate (Exit) 90 bpm 87 bpm 91 bpm 90 bpm 91 bpm   Oxygen Saturation (Admit) 96 % 95 % 95 % 96 % 94 %   Oxygen Saturation (Exercise) 88 % 89 % 94 % 91 % 88 %   Oxygen Saturation (Exit) 92 % 94 % 95 % 95 % 92 %   Rating of Perceived Exertion (Exercise) 11 12 14 13 13    Perceived Dyspnea (Exercise) -- 3 0 1 1   Symptoms hip/knee pain none none none none   Duration Continue with 30 min of aerobic exercise without signs/symptoms of physical distress. Continue with 30 min of aerobic exercise without signs/symptoms of physical distress. Continue with 30 min of aerobic exercise without signs/symptoms of physical distress. Continue with 30 min of aerobic exercise without signs/symptoms of physical distress. Continue with 30 min of aerobic exercise without signs/symptoms of physical distress.   Intensity THRR unchanged THRR unchanged THRR unchanged THRR unchanged THRR unchanged     Progression   Progression Continue to progress workloads to maintain intensity without signs/symptoms of physical distress. Continue to progress workloads to maintain intensity without signs/symptoms of physical distress. Continue to progress workloads to maintain intensity without signs/symptoms of physical distress. Continue to progress workloads to maintain intensity without  signs/symptoms of physical distress. Continue to progress workloads to maintain intensity without signs/symptoms of physical distress.   Average METs 2.88 2.8 3.5 3.08 3.07     Resistance Training   Training Prescription Yes -- -- -- --  Weight 4 lb 4 lb 4 lb 4 lb 4 lb   Reps 10-15 10-15 10-15 10-15 10-15     Interval Training   Interval Training No No No No No     Treadmill   MPH 2.3 2.7 2.7 2.7 2.7   Grade 0 1 1.5 1 0   Minutes 15 15 15 15 15    METs 2.76 3.44 3.63 3.44 3.07     Recumbant Bike   Level -- 3 -- 2 3   Watts -- 27 -- 26 29   Minutes -- 15 -- 15 15   METs -- 3.17 -- 3.1 3.23     NuStep   Level 1  T6: level 2 1 1 1 1   T6   Minutes 15 15 15 15 15    METs 3.6  T6: 2.4 METs 2.5 3.8 3.4 2.9     T5 Nustep   Level -- 1 1  T6 -- --   Minutes -- 15 15 -- --   METs -- 2.1 3.5 -- --     Home Exercise Plan   Plans to continue exercise at Home (comment)  [name] plans to walk her dogs outside for at least 30 minutes, as well as use her treadmill Home (comment)  [name] plans to walk her dogs outside for at least 30 minutes, as well as use her treadmill Home (comment)  [name] plans to walk her dogs outside for at least 30 minutes, as well as use her treadmill Home (comment)  [name] plans to walk her dogs outside for at least 30 minutes, as well as use her treadmill Home (comment)  [name] plans to walk her dogs outside for at least 30 minutes, as well as use her treadmill   Frequency Add 2 additional days to program exercise sessions. Add 2 additional days to program exercise sessions. Add 2 additional days to program exercise sessions. Add 2 additional days to program exercise sessions. Add 2 additional days to program exercise sessions.   Initial Home Exercises Provided 07/17/24 07/17/24 07/17/24 07/17/24 07/17/24     Oxygen   Maintain Oxygen Saturation 88% or higher 88% or higher 88% or higher 88% or higher 88% or higher      Exercise Comments:   Exercise Goals and  Review:   Exercise Goals Re-Evaluation :  Exercise Goals Re-Evaluation     Row Name 04/25/24 1404 05/09/24 1640 05/21/24 0835 06/04/24 1514 06/19/24 1545     Exercise Goal Re-Evaluation   Exercise Goals Review Increase Strength and Stamina;Increase Physical Activity;Understanding of Exercise Prescription Increase Strength and Stamina;Increase Physical Activity;Understanding of Exercise Prescription Increase Strength and Stamina;Increase Physical Activity;Understanding of Exercise Prescription Increase Strength and Stamina;Increase Physical Activity;Understanding of Exercise Prescription Increase Strength and Stamina;Increase Physical Activity;Understanding of Exercise Prescription   Comments Rhoda has not attended rehab since the last review due to personal reasons. We will continue to monitor her progress when she returns to the program. Rhoda is doing well in rehab. She returned to rehab in this review. She maintained a speed of 2.5 mph on the treadmill with no incline. She increased to level 2 on the T4 nustep. She maintained level 1 on the T6 nustep. We will continue to monitor her progress in the program. Rhoda has only attended one session since the last review. During her one session she continued to work at level 2 on the T4 nustep. She also has maintained her treadmill workload at a speed of 2.5 mph with no incline. We will  continue to monitor her progress in the program. Rhoda has not attended rehab since the last review. She last attended rehab on 05/06/2024. We will reach out to her to see when she plans to return. We will continue to monitor her progress when she returns to the program. Rhoda did not attend rehab during the last review period. She did return to rehab on 06/17/2024 and did well with exercise. We will continue to monitor her progress in the program.   Expected Outcomes Short: Return to rehab when appropriate. Long: Graduate. Short: Continue to progressively increase treadmill  workload. Long: Continue exercise to improve strength and stamina. Short: Attend rehab more consistently. Long: Continue exercise to improve strength and stamina. Short: Return to rehab when appropriate. Long: Graduate. Short: Continue to attend rehab consistently. Long: Continue exercise to improve strength and stamina.    Row Name 07/02/24 1508 07/16/24 1515 07/17/24 1131 07/31/24 1545 08/13/24 1427     Exercise Goal Re-Evaluation   Exercise Goals Review Increase Strength and Stamina;Increase Physical Activity;Understanding of Exercise Prescription Increase Strength and Stamina;Increase Physical Activity;Understanding of Exercise Prescription Increase Physical Activity;Able to understand and use Dyspnea scale;Understanding of Exercise Prescription;Increase Strength and Stamina;Knowledge and understanding of Target Heart Rate Range (THRR);Able to check pulse independently;Able to understand and use rate of perceived exertion (RPE) scale Increase Physical Activity;Increase Strength and Stamina;Understanding of Exercise Prescription Increase Physical Activity;Increase Strength and Stamina;Understanding of Exercise Prescription   Comments Rhoda only attended rehab twice during the last review period. She continues to do well walking at 2.5 mph with no incline on the treadmill. She also continues to do well on the T4 nustep and biostep. We will continue to monitor her progress in the program. Rhoda did not attend rehab during the last review period. She has since returned to her regular attendance in the program. We will continue to monitor her progress in the program. Reviewed home exercise with pt today from 11:20 to 11:30.  Pt plans to walk her dogs outside, and use her treadmill for exercise.  Reviewed THR, pulse, RPE, sign and symptoms, pulse oximetery and when to call 911 or MD.  Also discussed weather considerations and indoor options.  Pt voiced understanding. Rhoda continues to do well in the program.  She has been able to maintain her treadmill workload at a speed of 2. and no incline, as well as the T4 nustep at level 2. We will continue to monitor her progress in the program. Rhoda only attended two sessions in rehab since the last review. She continued to walk on the treadmill at a decreased speed of 2.3 mph and no incline. She has been restricted with exercise lately due to hip and knee pain. We will continue to monitor her progress in the program.   Expected Outcomes Short: Attend rehab more consistently. Long: Continue exercise to improve strength and stamina. Short: Attend rehab more consistently. Long: Continue exercise to improve strength and stamina. Short: Implement 2 days of at home exercise to current exercise prescription. Long: Contnue exercise to improve strength and stamina. Short: Increase treadmill workload to a speed of 2.44mph. Long: Continue exercise to improve strength and stamina. Short: Attend rehab more consistently. Long: Continue exercise to improve strength and stamina.    Row Name 08/28/24 1118 09/11/24 0742 09/23/24 1137 09/30/24 0953 10/10/24 1337     Exercise Goal Re-Evaluation   Exercise Goals Review Increase Physical Activity;Increase Strength and Stamina;Understanding of Exercise Prescription Increase Physical Activity;Increase Strength and Stamina;Understanding of Exercise Prescription Increase  Physical Activity;Increase Strength and Stamina;Understanding of Exercise Prescription Increase Physical Activity;Increase Strength and Stamina;Understanding of Exercise Prescription Increase Physical Activity;Increase Strength and Stamina;Understanding of Exercise Prescription   Comments Rhoda is doing well in rehab. She has been able to increase her treadmill workload to a speed of 2. and 1% incline. She was also able to use the T5 and T6 nustep at level 1. We will continue to monitor her progress in the program. Rhoda is doing well in rehab. She was recently able to  increase her workload on the treadmill to a speed of 2. and 1.5% incline. She was also able to maintain level 1 on both the T4 and T6 nustep. We will continue to monitor her progress in the program. [name] imporved by 20 feet in her 6 MWT. She will graduate soon and plans to continue exercise on her TM at home. Rhoda continues to do well in rehab. She maintained her speed on the treadmill at 2.7 mph and had an incline of 1%. She maintained level 1 on the T4 nustep. She worked at level 2 on the recumbent bike. We will continue to monitor her progress in the program. Rhoda continues to do well in rehab and will graduate soon. She completed her post walk test and improved by 1% with a total distance of 1500 feet. She increased to level 3 on the recumbent bike. She maintained level 1 on the T6 nustep. She maintained her speed of 2.7 mph on the treadmill and dropped to no incline. We will continue to monitor her progress in the program.   Expected Outcomes Short: Continue to increase treadmill workload. Long: continue exercise to improve strength and stamina. Short: Improve on post-6MWT. Long: Continue exercise to improve strength and stamina. Short: graduate. Long: continue exercise routine upon graduation from pulmonary rehab. Short: Continue to progressively increase treadmill workload. Long: Continue exercise to improve strength and stamina. Short: Graduate. Long: Continue to exercise independently.      Discharge Exercise Prescription (Final Exercise Prescription Changes):  Exercise Prescription Changes - 10/10/24 1300       Response to Exercise   Blood Pressure (Admit) 100/60    Blood Pressure (Exit) 106/70    Heart Rate (Admit) 83 bpm    Heart Rate (Exercise) 114 bpm    Heart Rate (Exit) 91 bpm    Oxygen Saturation (Admit) 94 %    Oxygen Saturation (Exercise) 88 %    Oxygen Saturation (Exit) 92 %    Rating of Perceived Exertion (Exercise) 13    Perceived Dyspnea (Exercise) 1    Symptoms  none    Duration Continue with 30 min of aerobic exercise without signs/symptoms of physical distress.    Intensity THRR unchanged      Progression   Progression Continue to progress workloads to maintain intensity without signs/symptoms of physical distress.    Average METs 3.07      Resistance Training   Weight 4 lb    Reps 10-15      Interval Training   Interval Training No      Treadmill   MPH 2.7    Grade 0    Minutes 15    METs 3.07      Recumbant Bike   Level 3    Watts 29    Minutes 15    METs 3.23      NuStep   Level 1   T6   Minutes 15    METs 2.9  Home Exercise Plan   Plans to continue exercise at Home (comment)   [name] plans to walk her dogs outside for at least 30 minutes, as well as use her treadmill   Frequency Add 2 additional days to program exercise sessions.    Initial Home Exercises Provided 07/17/24      Oxygen   Maintain Oxygen Saturation 88% or higher          Nutrition:  Target Goals: Understanding of nutrition guidelines, daily intake of sodium 1500mg , cholesterol 200mg , calories 30% from fat and 7% or less from saturated fats, daily to have 5 or more servings of fruits and vegetables.  Education: Nutrition 1 -Group instruction provided by verbal, written material, interactive activities, discussions, models, and posters to present general guidelines for heart healthy nutrition including macronutrients, label reading, and promoting whole foods over processed counterparts. Education serves as pensions consultant of discussion of heart healthy eating for all. Written material provided at class time.     Education: Nutrition 2 -Group instruction provided by verbal, written material, interactive activities, discussions, models, and posters to present general guidelines for heart healthy nutrition including sodium, cholesterol, and saturated fat. Providing guidance of habit forming to improve blood pressure, cholesterol, and body weight. Written  material provided at class time.     Biometrics:   Post Biometrics - 09/23/24 1139        Post  Biometrics   Height 5' 4.5 (1.638 m)    Weight 165 lb 6.4 oz (75 kg)    Waist Circumference 38 inches    Hip Circumference 45.5 inches    Waist to Hip Ratio 0.84 %    BMI (Calculated) 27.96    Single Leg Stand 3.34 seconds          Nutrition Therapy Plan and Nutrition Goals:   Nutrition Assessments:  MEDIFICTS Score Key: >=70 Need to make dietary changes  40-70 Heart Healthy Diet <= 40 Therapeutic Level Cholesterol Diet  Flowsheet Row Pulmonary Rehab from 09/25/2024 in [facility]  Picture Your Plate Total Score on Admission 50  Picture Your Plate Total Score on Discharge 51   Picture Your Plate Scores: <59 Unhealthy dietary pattern with much room for improvement. 41-50 Dietary pattern unlikely to meet recommendations for good health and room for improvement. 51-60 More healthful dietary pattern, with some room for improvement.  >60 Healthy dietary pattern, although there may be some specific behaviors that could be improved.   Nutrition Goals Re-Evaluation:  Nutrition Goals Re-Evaluation     Row Name 04/26/24 1115 06/17/24 1122 07/15/24 1114 08/12/24 1115 09/23/24 1113     Goals   Comment Patient deferred RD appointment. Patient deferred RD appointment. Patient deferred RD appointment. Patient deferred RD appointment. Patient deferred RD appointment.      Nutrition Goals Discharge (Final Nutrition Goals Re-Evaluation):  Nutrition Goals Re-Evaluation - 09/23/24 1113       Goals   Comment Patient deferred RD appointment.          Psychosocial: Target Goals: Acknowledge presence or absence of significant depression and/or stress, maximize coping skills, provide positive support system. Participant is able to verbalize types and ability to use techniques and skills needed for reducing stress and depression.   Education: Stress, Anxiety,  and Depression - Group verbal and visual presentation to define topics covered.  Reviews how body is impacted by stress, anxiety, and depression.  Also discusses healthy ways to reduce stress and to treat/manage anxiety and depression.  Written material  provided at class time.   Education: Sleep Hygiene -Provides group verbal and written instruction about how sleep can affect your health.  Define sleep hygiene, discuss sleep cycles and impact of sleep habits. Review good sleep hygiene tips.    Initial Review & Psychosocial Screening:   Quality of Life Scores:  Scores of 19 and below usually indicate a poorer quality of life in these areas.  A difference of  2-3 points is a clinically meaningful difference.  A difference of 2-3 points in the total score of the Quality of Life Index has been associated with significant improvement in overall quality of life, self-image, physical symptoms, and general health in studies assessing change in quality of life.  PHQ-9: Review Flowsheet  More data exists      09/25/2024 09/23/2024 08/12/2024 07/15/2024 06/17/2024  Depression screen PHQ 2/9  Decreased Interest 0 0 0 0 0  Down, Depressed, Hopeless 0 0 0 0 0  PHQ - 2 Score 0 0 0 0 0  Altered sleeping 2 1 3 2 3   Tired, decreased energy 2 2 1  0 2  Change in appetite 3 3 1 3 3   Feeling bad or failure about yourself  0 0 0 0 0  Trouble concentrating 0 0 0 0 0  Moving slowly or fidgety/restless 0 0 0 0 0  Suicidal thoughts 0 0 0 0 0  PHQ-9 Score 7 6 5 5  8    Difficult doing work/chores Not difficult at all Somewhat difficult Not difficult at all Not difficult at all Somewhat difficult    Details       Data saved with a previous flowsheet row definition        Interpretation of Total Score  Total Score Depression Severity:  1-4 = Minimal depression, 5-9 = Mild depression, 10-14 = Moderate depression, 15-19 = Moderately severe depression, 20-27 = Severe depression   Psychosocial Evaluation and  Intervention:   Psychosocial Re-Evaluation:  Psychosocial Re-Evaluation     Row Name 04/26/24 1121 06/17/24 1127 07/15/24 1117 08/12/24 1117 09/23/24 1117     Psychosocial Re-Evaluation   Current issues with Current Sleep Concerns Current Sleep Concerns Current Sleep Concerns;Current Stress Concerns Current Sleep Concerns;Current Stress Concerns Current Sleep Concerns;Current Stress Concerns   Comments Reviewed patient health questionnaire (PHQ-9) with patient for follow up. Previously, patients score indicated signs/symptoms of depression.  Reviewed to see if patient is improving symptom wise while in program.  Score decreased and patient states that it is because she has trouble sleeping still and is bored. Delano states she had alot of hobbies and wants to do those sorts of things again. Reviewed patient health questionnaire (PHQ-9) with patient for follow up. Previously, patients score indicated signs/symptoms of depression.  Reviewed to see if patient is improving symptom wise while in program.  Score stayed the same. She has been out prior due to COVID and vacation. Reviewed patient health questionnaire (PHQ-9) with patient for follow up. Previously, patients score indicated signs/symptoms of depression.  Reviewed to see if patient is improving symptom wise while in program.  Score improved and patient states that it is because she has been able to have more energy. Reviewed patient health questionnaire (PHQ-9) with patient for follow up. Previously, patients score indicated signs/symptoms of depression.  Reviewed to see if patient is improving symptom wise while in program.  Score stayed the same. She has been praying more and has a postive outlook on life. Rhoda reports that she has no stress concerns.  Her PHQ 9 score did go up from a 5-6, but she reports no concerns with mental health. She uses a sleeping pill which helps sometimes.she will be graduating in the next week or 2 from pulmonary  rehab.   Expected Outcomes Short: work towards a new hobby. Long: maintain a healthy mental state. Short: Continue to work toward an improvement in PHQ9 scores by attending [facility] regularly. Long: Continue to improve stress and depression coping skills by talking with staff and attending [facility] regularly and work toward a positive mental state. Short: Continue to attend [facility] regularly for regular exercise and social engagement. Long: Continue to improve symptoms and manage a positive mental state. Short: Continue to attend [facility] regularly for regular exercise and social engagement. Long: Continue to improve symptoms and manage a positive mental state. Short:graduate. Long: maintain good mental health routine upon graduation.   Interventions Encouraged to attend Pulmonary Rehabilitation for the exercise Encouraged to attend Pulmonary Rehabilitation for the exercise Encouraged to attend Pulmonary Rehabilitation for the exercise Encouraged to attend Pulmonary Rehabilitation for the exercise Encouraged to attend Pulmonary Rehabilitation for the exercise   Continue Psychosocial Services  Follow up required by staff Follow up required by staff Follow up required by staff Follow up required by staff No Follow up required      Psychosocial Discharge (Final Psychosocial Re-Evaluation):  Psychosocial Re-Evaluation - 09/23/24 1117       Psychosocial Re-Evaluation   Current issues with Current Sleep Concerns;Current Stress Concerns    Comments Rhoda reports that she has no stress concerns. Her PHQ 9 score did go up from a 5-6, but she reports no concerns with mental health. She uses a sleeping pill which helps sometimes.she will be graduating in the next week or 2 from pulmonary rehab.    Expected Outcomes Short:graduate. Long: maintain good mental health routine upon graduation.    Interventions Encouraged to attend Pulmonary Rehabilitation for the exercise    Continue Psychosocial Services  No  Follow up required          Education: Education Goals: Education classes will be provided on a weekly basis, covering required topics. Participant will state understanding/return demonstration of topics presented.  Learning Barriers/Preferences:   General Pulmonary Education Topics:  Infection Prevention: - Provides verbal and written material to individual with discussion of infection control including proper hand washing and proper equipment cleaning during exercise session. Flowsheet Row Pulmonary Rehab from 06/19/2024 in [facility] Card[facility] Pulmonary Rehab  Date 02/14/24  Educator [facility]  Instruction Review Code 1- Verbalizes Understanding    Falls Prevention: - Provides verbal and written material to individual with discussion of falls prevention and safety. Flowsheet Row Pulmonary Rehab from 06/19/2024 in [facility] Card[facility] and Pulmonary Rehab  Date 02/14/24  Educator [facility]  Instruction Review Code 1- Verbalizes Understanding    Chronic Lung Disease Review: - Group verbal instruction with posters, models, PowerPoint presentations and videos,  to review new updates, new respiratory medications, new advancements in procedures and treatments. Providing information on websites and 800 numbers for continued self-education. Includes information about supplement oxygen, available portable oxygen systems, continuous and intermittent flow rates, oxygen safety, concentrators, and Medicare reimbursement for oxygen. Explanation of Pulmonary Drugs, including class, frequency, complications, importance of spacers, rinsing mouth after steroid MDI's, and proper cleaning methods for nebulizers. Review of basic lung anatomy and physiology related to function, structure, and complications of lung disease. Review of risk factors. Discussion about methods for diagnosing sleep apnea and types of masks and machines  for OSA. Includes a review of the use of types of environmental controls: home humidity, furnaces,  filters, dust mite/pet prevention, HEPA vacuums. Discussion about weather changes, air quality and the benefits of nasal washing. Instruction on Warning signs, infection symptoms, calling MD promptly, preventive modes, and value of vaccinations. Review of effective airway clearance, coughing and/or vibration techniques. Emphasizing that all should Create an Action Plan. Written material provided at class time. Flowsheet Row Pulmonary Rehab from 06/19/2024 in [facility] Cardiac and Pulmonary Rehab  Education need identified 02/14/24    AED/CPR: - Group verbal and written instruction with the use of models to demonstrate the basic use of the AED with the basic ABC's of resuscitation.    Tests and Procedures:  - Group verbal and visual presentation and models provide information about basic cardiac anatomy and function. Reviews the testing methods done to diagnose heart disease and the outcomes of the test results. Describes the treatment choices: Medical Management, Angioplasty, or Coronary Bypass Surgery for treating various heart conditions including Myocardial Infarction, Angina, Valve Disease, and Cardiac Arrhythmias.  Written material provided at class time.   Medication Safety: - Group verbal and visual instruction to review commonly prescribed medications for heart and lung disease. Reviews the medication, class of the drug, and side effects. Includes the steps to properly store meds and maintain the prescription regimen.  Written material given at graduation.   Other: -Provides group and verbal instruction on various topics (see comments)   Knowledge Questionnaire Score:  Knowledge Questionnaire Score - 09/25/24 1132       Knowledge Questionnaire Score   Pre Score 14/18    Post Score 16/18           Core Components/Risk Factors/Patient Goals at Admission:   Education:Diabetes - Individual verbal and written instruction to review signs/symptoms of diabetes, desired ranges of  glucose level fasting, after meals and with exercise. Acknowledge that pre and post exercise glucose checks will be done for 3 sessions at entry of program.   Know Your Numbers and Heart Failure: - Group verbal and visual instruction to discuss disease risk factors for cardiac and pulmonary disease and treatment options.  Reviews associated critical values for Overweight/Obesity, Hypertension, Cholesterol, and Diabetes.  Discusses basics of heart failure: signs/symptoms and treatments.  Introduces Heart Failure Zone chart for action plan for heart failure. Written material provided at class time.   Core Components/Risk Factors/Patient Goals Review:   Goals and Risk Factor Review     Row Name 04/26/24 1124 06/17/24 1121 07/15/24 1113 08/12/24 1118 09/23/24 1123     Core Components/Risk Factors/Patient Goals Review   Personal Goals Review Weight Management/Obesity Improve shortness of breath with ADL's Develop more efficient breathing techniques such as purse lipped breathing and diaphragmatic breathing and practicing self-pacing with activity. Develop more efficient breathing techniques such as purse lipped breathing and diaphragmatic breathing and practicing self-pacing with activity. Weight Management/Obesity;Improve shortness of breath with ADL's   Review Rhoda wants to lose some weight. She feels like she is heavy and wants to reach a weight goal 135lbs. She planes to lose a few pounds in the next few weeks. patient is back from being on a medical hold. Spoke to patient again about their shortness of breath and what they can do to improve. Patient has been informed of breathing techniques when starting the program. Patient is informed to tell staff if they have had any med changes and that certain meds they are taking or not taking can be causing  shortness of breath. Diaphragmatic and PLB breathing explained and performed with patient. Patient has a better understanding of how to do these exercises  to help with breathing performance and relaxation. Patient performed breathing techniques adequately and to practice further at home. Rhoda states that she is not doing breathing techniques. She states that she just hasnt done them. Informed her that is is important to do especially when she feels short of breath. Patient will try to work on PLB techniques. Rhoda reports that she has noticed improvments in SOB and that her oxygen sats are always in the 90's during exercise in class. Her weight has been steady but she did lose an inch in her waist since starting the program.   Expected Outcomes Short: lose a few pounds.Long: reach weight goal. Short: Attend [facility] regularly to improve shortness of breath with ADL's. Long: maintain independence with ADL's Short: practice PLB and diaphragmatic breathing at home. Long: Use PLB and diaphragmatic breathing independently post [facility]. Short: practice PLB and diaphragmatic breathing at home. Long: Use PLB and diaphragmatic breathing independently post [facility]. Short: graudate from pulmonary rehab. Long: continue to weigh at home ans work towards    Row Name 09/23/24 1125             Core Components/Risk Factors/Patient Goals Review   Review Rhoda reports that she has noticed improvments in SOB and that her oxygen sats are always in the 90's during exercise in class. Her weight has been steady but she did lose an inch in her waist since starting the program.       Expected Outcomes Short: graudate from pulmonary rehab. Long: continue to weigh at home and work towards weight loss goal of 5-10 lbs. continue to improve in SOB.          Core Components/Risk Factors/Patient Goals at Discharge (Final Review):   Goals and Risk Factor Review - 09/23/24 1125       Core Components/Risk Factors/Patient Goals Review   Review Rhoda reports that she has noticed improvments in SOB and that her oxygen sats are always in the 90's during exercise in class. Her  weight has been steady but she did lose an inch in her waist since starting the program.    Expected Outcomes Short: graudate from pulmonary rehab. Long: continue to weigh at home and work towards weight loss goal of 5-10 lbs. continue to improve in SOB.          ITP Comments:  ITP Comments     Row Name 05/08/24 1036 05/08/24 1042 06/05/24 1116 07/03/24 1054 07/31/24 1002   ITP Comments 30 Day review completed. Medical Director ITP review done, changes made as directed, and signed approval by Medical Director. Virtual Visit completed. Patient informed on EP and RD appointment and 6 Minute walk test. Patient also informed of patient health questionnaires on My Chart. Patient Verbalizes understanding. For visist on 02/14/2024. 30 Day review completed. Medical Director ITP review done; changes made as directed and signed approval by Medical Director. New to program. 30 Day review completed. Medical Director ITP review done; changes made as directed and signed approval by Medical Director. 30 Day review completed. Medical Director ITP review done; changes made as directed and signed approval by Medical Director.    Row Name 08/28/24 0947 09/25/24 1148         ITP Comments 30 Day review completed. Medical Director ITP review done, changes made as directed, and signed approval by Medical Director. 30 Day review completed.  Medical Director ITP review done, changes made as directed, and signed approval by Medical Director.         Comments: discharge ITP     [1]  Current Outpatient Medications:    acetaminophen  (TYLENOL ) 325 MG tablet, Take 325 mg by mouth., Disp: , Rfl:    acetaminophen  (TYLENOL ) 500 MG tablet, Take 500 mg by mouth every 6 (six) hours as needed for moderate pain or headache., Disp: , Rfl:    albuterol  (VENTOLIN  HFA) 108 (90 Base) MCG/ACT inhaler, Inhale 1-2 puffs into the lungs every 6 (six) hours as needed for wheezing or shortness of breath. (Patient not taking: Reported on  02/14/2024), Disp: 18 g, Rfl: 0   albuterol  (VENTOLIN  HFA) 108 (90 Base) MCG/ACT inhaler, Inhale 2 puffs into the lungs., Disp: , Rfl:    Albuterol  Sulfate (PROAIR  RESPICLICK) 108 (90 Base) MCG/ACT AEPB, Inhale 2 puffs into the lungs., Disp: , Rfl:    fluticasone  (FLONASE ) 50 MCG/ACT nasal spray, Place 1 spray into both nostrils daily for 7 days., Disp: 1 g, Rfl: 0   furosemide (LASIX) 40 MG tablet, Take 40 mg by mouth daily.  (Patient not taking: Reported on 02/14/2024), Disp: , Rfl:    Ipratropium-Albuterol  (COMBIVENT) 20-100 MCG/ACT AERS respimat, Inhale 2 puffs into the lungs 3 (three) times daily as needed for shortness of breath., Disp: , Rfl:    lactulose (CHRONULAC) 10 GM/15ML solution, Take 10 g by mouth 3 (three) times daily as needed (for elevated ammonia level)., Disp: , Rfl:    NON FORMULARY, B3, Disp: , Rfl:    oxyCODONE  (ROXICODONE ) 5 MG immediate release tablet, Take 1 tablet (5 mg total) by mouth every 8 (eight) hours as needed for up to 16 doses for breakthrough pain or severe pain. (Patient not taking: Reported on 02/14/2024), Disp: 16 tablet, Rfl: 0   pantoprazole (PROTONIX) 40 MG tablet, Take 40 mg by mouth every morning.  (Patient not taking: Reported on 02/14/2024), Disp: , Rfl:    pantoprazole (PROTONIX) 40 MG tablet, Take 40 mg by mouth., Disp: , Rfl:    Potassium 99 MG TABS, Take 99 mg by mouth daily., Disp: , Rfl:    rifaximin (XIFAXAN) 550 MG TABS tablet, Take 550 mg by mouth every morning.  (Patient not taking: Reported on 02/14/2024), Disp: , Rfl:    spironolactone (ALDACTONE) 100 MG tablet, Take 100 mg by mouth daily. , Disp: , Rfl:    tacrolimus (PROGRAF) 5 MG capsule, Take 25 mg by mouth 2 (two) times daily., Disp: , Rfl:    traZODone (DESYREL) 50 MG tablet, Take 50 mg by mouth at bedtime., Disp: , Rfl:  [2]  Social History Tobacco Use  Smoking Status Former   Current packs/day: 1.50   Average packs/day: 1.5 packs/day for 30.0 years (45.0 ttl pk-yrs)   Types: Cigarettes   Smokeless Tobacco Never  Tobacco Comments   Quit in 2021.

## 2024-10-11 NOTE — Progress Notes (Signed)
 Daily Session Note  Patient Details  Name: Michelle Acevedo MRN: [id number] Date of Birth: 07/05/1951 Referring Provider:   Flowsheet Row Pulmonary Rehab from 02/16/2024 in [facility]  Referring Provider Dr. Dickey Orem    Encounter Date: 10/11/2024  Check In:  Session Check In - 10/11/24 1113       Check-In   Supervising physician immediately available to respond to emergencies See telemetry face sheet for immediately available ER MD    Location [facility] & [facility]    Staff Present Burnard Davenport RN,BSN,MPA;Laura Cates RN,BSN;Joseph Rolinda NORWOOD HARMAN Verlie Laird, [place], Exercise Physiologist    Virtual Visit No    Medication changes reported     No    Fall or balance concerns reported    No    Tobacco Cessation No Change    Warm-up and Cool-down Performed on first and last piece of equipment    Resistance Training Performed Yes    VAD Patient? No    PAD/SET Patient? No      Pain Assessment   Currently in Pain? No/denies             Tobacco Use History[1]  Goals Met:  Proper associated with RPD/PD & O2 Sat Independence with exercise equipment Using PLB without cueing & demonstrates good technique Exercise tolerated well No report of concerns or symptoms today Strength training completed today  Goals Unmet:  Not Applicable  Comments:  [name] graduated today from  rehab with 35 sessions completed.  Details of the patient's exercise prescription and what She needs to do in order to continue the prescription and progress were discussed with patient.  Patient was given a copy of prescription and goals.  Patient verbalized understanding. [name] plans to continue to exercise by walking at home outside and on her treadmill.    Dr. Oneil Pinal is Medical Director for [facility].  Dr. Fuad Aleskerov is Medical Director for [facility].    [1]  Social History Tobacco Use  Smoking Status Former    Current packs/day: 1.50   Average packs/day: 1.5 packs/day for 30.0 years (45.0 ttl pk-yrs)   Types: Cigarettes  Smokeless Tobacco Never  Tobacco Comments   Quit in 2021.

## 2024-10-14 ENCOUNTER — Encounter

## 2024-10-16 ENCOUNTER — Encounter

## 2024-10-18 ENCOUNTER — Encounter
